# Patient Record
Sex: Male | Born: 1955
Health system: Southern US, Community
[De-identification: ages and names within clinical notes are randomized; demographics above are authoritative.]

## PROBLEM LIST (undated history)

## (undated) DIAGNOSIS — C801 Malignant (primary) neoplasm, unspecified: Secondary | ICD-10-CM

## (undated) DIAGNOSIS — M1990 Unspecified osteoarthritis, unspecified site: Secondary | ICD-10-CM

## (undated) DIAGNOSIS — J449 Chronic obstructive pulmonary disease, unspecified: Secondary | ICD-10-CM

## (undated) DIAGNOSIS — Z923 Personal history of irradiation: Secondary | ICD-10-CM

## (undated) HISTORY — PX: ROTATOR CUFF REPAIR: SHX139

## (undated) HISTORY — DX: Malignant (primary) neoplasm, unspecified: C80.1

---

## 2005-05-25 ENCOUNTER — Ambulatory Visit (HOSPITAL_COMMUNITY): Admission: RE | Admit: 2005-05-25 | Discharge: 2005-05-25 | Payer: Self-pay | Admitting: Internal Medicine

## 2006-12-02 ENCOUNTER — Ambulatory Visit (HOSPITAL_COMMUNITY): Admission: RE | Admit: 2006-12-02 | Discharge: 2006-12-02 | Payer: Self-pay | Admitting: Internal Medicine

## 2006-12-09 ENCOUNTER — Ambulatory Visit (HOSPITAL_COMMUNITY): Admission: RE | Admit: 2006-12-09 | Discharge: 2006-12-09 | Payer: Self-pay | Admitting: Internal Medicine

## 2008-02-28 ENCOUNTER — Ambulatory Visit (HOSPITAL_COMMUNITY): Admission: RE | Admit: 2008-02-28 | Discharge: 2008-02-28 | Payer: Self-pay | Admitting: Internal Medicine

## 2010-11-11 NOTE — Procedures (Signed)
NAMEAREN, PRYDE                ACCOUNT NO.:  [id number]   MEDICAL RECORD NO.:  [id number]          PATIENT TYPE:  OUT   LOCATION:  RAD                           FACILITY:  [facility]   PHYSICIAN:  Kingsley Callander. Ouida Sills, MD       DATE OF BIRTH:  [birth year]   DATE OF PROCEDURE:  DATE OF DISCHARGE:                                  STRESS TEST   The patient exercised 13 minutes (1 minute into stage five of the Bruce  protocol) obtaining a maximal heart rate of 169 (101% of the age  predicted maximal heart rate) and a workload of 14.8 METS and  discontinued exercise due to fatigue.  There were no symptoms of chest  pain.  There were no arrhythmias.  There were no ST-segment changes  diagnostic of ischemia.  The baseline electrocardiogram revealed normal  sinus rhythm/sinus bradycardia at 56 beats per minute.   IMPRESSION:  No evidence of exercise-induced ischemia.      Kingsley Callander. Ouida Sills, MD  Electronically Signed     ROF/MEDQ  D:  12/09/2006  T:  12/09/2006  Job:  [id number]

## 2010-12-23 ENCOUNTER — Other Ambulatory Visit (HOSPITAL_COMMUNITY): Payer: Self-pay | Admitting: Internal Medicine

## 2010-12-23 ENCOUNTER — Ambulatory Visit (HOSPITAL_COMMUNITY)
Admission: RE | Admit: 2010-12-23 | Discharge: 2010-12-23 | Disposition: A | Discharge status: Home / Self Care | Payer: Private Health Insurance - Indemnity | Source: Ambulatory Visit | Attending: Internal Medicine | Admitting: Internal Medicine

## 2010-12-23 DIAGNOSIS — M79602 Pain in left arm: Secondary | ICD-10-CM

## 2010-12-23 DIAGNOSIS — M79609 Pain in unspecified limb: Secondary | ICD-10-CM | POA: Insufficient documentation

## 2012-07-26 ENCOUNTER — Ambulatory Visit (HOSPITAL_COMMUNITY)
Admission: RE | Admit: 2012-07-26 | Discharge: 2012-07-26 | Disposition: A | Discharge status: Home / Self Care | Payer: Private Health Insurance - Indemnity | Source: Ambulatory Visit | Attending: Internal Medicine | Admitting: Internal Medicine

## 2012-07-26 ENCOUNTER — Other Ambulatory Visit (HOSPITAL_COMMUNITY): Payer: Self-pay | Admitting: Internal Medicine

## 2012-07-26 DIAGNOSIS — M79609 Pain in unspecified limb: Secondary | ICD-10-CM | POA: Insufficient documentation

## 2012-07-26 DIAGNOSIS — M79603 Pain in arm, unspecified: Secondary | ICD-10-CM

## 2012-07-26 DIAGNOSIS — M545 Low back pain, unspecified: Secondary | ICD-10-CM | POA: Insufficient documentation

## 2012-07-26 DIAGNOSIS — R079 Chest pain, unspecified: Secondary | ICD-10-CM | POA: Insufficient documentation

## 2013-08-31 ENCOUNTER — Other Ambulatory Visit (HOSPITAL_COMMUNITY): Payer: Self-pay | Admitting: Internal Medicine

## 2013-08-31 ENCOUNTER — Ambulatory Visit (HOSPITAL_COMMUNITY)
Admission: RE | Admit: 2013-08-31 | Discharge: 2013-08-31 | Disposition: A | Discharge status: Home / Self Care | Payer: BC Managed Care – PPO | Source: Ambulatory Visit | Attending: Internal Medicine | Admitting: Internal Medicine

## 2013-08-31 DIAGNOSIS — R079 Chest pain, unspecified: Secondary | ICD-10-CM

## 2013-08-31 DIAGNOSIS — M25512 Pain in left shoulder: Secondary | ICD-10-CM

## 2013-08-31 DIAGNOSIS — M25519 Pain in unspecified shoulder: Secondary | ICD-10-CM | POA: Insufficient documentation

## 2014-10-11 ENCOUNTER — Ambulatory Visit (INDEPENDENT_AMBULATORY_CARE_PROVIDER_SITE_OTHER): Payer: BLUE CROSS/BLUE SHIELD | Admitting: Otolaryngology

## 2014-10-11 DIAGNOSIS — H6983 Other specified disorders of Eustachian tube, bilateral: Secondary | ICD-10-CM

## 2014-11-29 ENCOUNTER — Ambulatory Visit (INDEPENDENT_AMBULATORY_CARE_PROVIDER_SITE_OTHER): Payer: BLUE CROSS/BLUE SHIELD | Admitting: Otolaryngology

## 2014-11-29 DIAGNOSIS — H6983 Other specified disorders of Eustachian tube, bilateral: Secondary | ICD-10-CM

## 2016-02-27 DIAGNOSIS — Z125 Encounter for screening for malignant neoplasm of prostate: Secondary | ICD-10-CM | POA: Diagnosis not present

## 2016-02-27 DIAGNOSIS — T7840XD Allergy, unspecified, subsequent encounter: Secondary | ICD-10-CM | POA: Diagnosis not present

## 2016-02-27 DIAGNOSIS — N400 Enlarged prostate without lower urinary tract symptoms: Secondary | ICD-10-CM | POA: Diagnosis not present

## 2016-02-27 DIAGNOSIS — R002 Palpitations: Secondary | ICD-10-CM | POA: Diagnosis not present

## 2016-03-06 DIAGNOSIS — Z0001 Encounter for general adult medical examination with abnormal findings: Secondary | ICD-10-CM | POA: Diagnosis not present

## 2016-03-06 DIAGNOSIS — Z23 Encounter for immunization: Secondary | ICD-10-CM | POA: Diagnosis not present

## 2016-03-06 DIAGNOSIS — M2550 Pain in unspecified joint: Secondary | ICD-10-CM | POA: Diagnosis not present

## 2016-03-06 DIAGNOSIS — M25511 Pain in right shoulder: Secondary | ICD-10-CM | POA: Diagnosis not present

## 2016-03-06 DIAGNOSIS — Z6824 Body mass index (BMI) 24.0-24.9, adult: Secondary | ICD-10-CM | POA: Diagnosis not present

## 2016-03-06 DIAGNOSIS — R002 Palpitations: Secondary | ICD-10-CM | POA: Diagnosis not present

## 2016-03-20 DIAGNOSIS — Z23 Encounter for immunization: Secondary | ICD-10-CM | POA: Diagnosis not present

## 2016-03-23 DIAGNOSIS — Z122 Encounter for screening for malignant neoplasm of respiratory organs: Secondary | ICD-10-CM | POA: Diagnosis not present

## 2016-03-23 DIAGNOSIS — Z87891 Personal history of nicotine dependence: Secondary | ICD-10-CM | POA: Diagnosis not present

## 2016-03-25 DIAGNOSIS — M25442 Effusion, left hand: Secondary | ICD-10-CM | POA: Diagnosis not present

## 2016-03-25 DIAGNOSIS — M25441 Effusion, right hand: Secondary | ICD-10-CM | POA: Diagnosis not present

## 2016-03-25 DIAGNOSIS — M25512 Pain in left shoulder: Secondary | ICD-10-CM | POA: Diagnosis not present

## 2016-03-25 DIAGNOSIS — M25511 Pain in right shoulder: Secondary | ICD-10-CM | POA: Diagnosis not present

## 2016-03-27 ENCOUNTER — Other Ambulatory Visit: Payer: Self-pay | Admitting: Internal Medicine

## 2016-03-27 DIAGNOSIS — Z139 Encounter for screening, unspecified: Secondary | ICD-10-CM

## 2016-03-27 DIAGNOSIS — N281 Cyst of kidney, acquired: Secondary | ICD-10-CM

## 2016-03-31 DIAGNOSIS — M7581 Other shoulder lesions, right shoulder: Secondary | ICD-10-CM | POA: Diagnosis not present

## 2016-04-08 ENCOUNTER — Other Ambulatory Visit: Payer: Self-pay | Admitting: Internal Medicine

## 2016-04-08 ENCOUNTER — Ambulatory Visit
Admission: RE | Admit: 2016-04-08 | Discharge: 2016-04-08 | Disposition: A | Discharge status: Home / Self Care | Payer: BLUE CROSS/BLUE SHIELD | Source: Ambulatory Visit | Attending: Internal Medicine | Admitting: Internal Medicine

## 2016-04-08 DIAGNOSIS — Z139 Encounter for screening, unspecified: Secondary | ICD-10-CM

## 2016-04-08 DIAGNOSIS — N281 Cyst of kidney, acquired: Secondary | ICD-10-CM | POA: Diagnosis not present

## 2016-04-08 DIAGNOSIS — Z77018 Contact with and (suspected) exposure to other hazardous metals: Secondary | ICD-10-CM

## 2016-04-08 DIAGNOSIS — Z01818 Encounter for other preprocedural examination: Secondary | ICD-10-CM | POA: Diagnosis not present

## 2016-04-08 MED ORDER — GADOBENATE DIMEGLUMINE 529 MG/ML IV SOLN
14.0000 mL | Freq: Once | INTRAVENOUS | Status: AC | PRN
  Administered 2016-04-08: 14 mL via INTRAVENOUS

## 2016-04-27 DIAGNOSIS — M130 Polyarthritis, unspecified: Secondary | ICD-10-CM | POA: Diagnosis not present

## 2016-04-27 DIAGNOSIS — M79641 Pain in right hand: Secondary | ICD-10-CM | POA: Diagnosis not present

## 2016-04-27 DIAGNOSIS — M25511 Pain in right shoulder: Secondary | ICD-10-CM | POA: Diagnosis not present

## 2016-04-27 DIAGNOSIS — M79642 Pain in left hand: Secondary | ICD-10-CM | POA: Diagnosis not present

## 2016-04-29 DIAGNOSIS — H52203 Unspecified astigmatism, bilateral: Secondary | ICD-10-CM | POA: Diagnosis not present

## 2016-04-29 DIAGNOSIS — H524 Presbyopia: Secondary | ICD-10-CM | POA: Diagnosis not present

## 2016-04-29 DIAGNOSIS — H5203 Hypermetropia, bilateral: Secondary | ICD-10-CM | POA: Diagnosis not present

## 2016-04-30 DIAGNOSIS — K648 Other hemorrhoids: Secondary | ICD-10-CM | POA: Diagnosis not present

## 2016-05-12 DIAGNOSIS — K642 Third degree hemorrhoids: Secondary | ICD-10-CM | POA: Diagnosis not present

## 2016-06-04 DIAGNOSIS — J019 Acute sinusitis, unspecified: Secondary | ICD-10-CM | POA: Diagnosis not present

## 2016-06-10 DIAGNOSIS — M25442 Effusion, left hand: Secondary | ICD-10-CM | POA: Diagnosis not present

## 2016-06-10 DIAGNOSIS — R799 Abnormal finding of blood chemistry, unspecified: Secondary | ICD-10-CM | POA: Diagnosis not present

## 2016-06-10 DIAGNOSIS — M0570 Rheumatoid arthritis with rheumatoid factor of unspecified site without organ or systems involvement: Secondary | ICD-10-CM | POA: Diagnosis not present

## 2016-06-10 DIAGNOSIS — M25441 Effusion, right hand: Secondary | ICD-10-CM | POA: Diagnosis not present

## 2016-06-15 DIAGNOSIS — M546 Pain in thoracic spine: Secondary | ICD-10-CM | POA: Diagnosis not present

## 2016-06-15 DIAGNOSIS — M5413 Radiculopathy, cervicothoracic region: Secondary | ICD-10-CM | POA: Diagnosis not present

## 2016-06-15 DIAGNOSIS — M9901 Segmental and somatic dysfunction of cervical region: Secondary | ICD-10-CM | POA: Diagnosis not present

## 2016-06-15 DIAGNOSIS — M9902 Segmental and somatic dysfunction of thoracic region: Secondary | ICD-10-CM | POA: Diagnosis not present

## 2016-06-18 DIAGNOSIS — M5413 Radiculopathy, cervicothoracic region: Secondary | ICD-10-CM | POA: Diagnosis not present

## 2016-06-18 DIAGNOSIS — M9901 Segmental and somatic dysfunction of cervical region: Secondary | ICD-10-CM | POA: Diagnosis not present

## 2016-06-18 DIAGNOSIS — M546 Pain in thoracic spine: Secondary | ICD-10-CM | POA: Diagnosis not present

## 2016-06-18 DIAGNOSIS — M9902 Segmental and somatic dysfunction of thoracic region: Secondary | ICD-10-CM | POA: Diagnosis not present

## 2016-06-24 DIAGNOSIS — M5413 Radiculopathy, cervicothoracic region: Secondary | ICD-10-CM | POA: Diagnosis not present

## 2016-06-24 DIAGNOSIS — M546 Pain in thoracic spine: Secondary | ICD-10-CM | POA: Diagnosis not present

## 2016-06-24 DIAGNOSIS — M9901 Segmental and somatic dysfunction of cervical region: Secondary | ICD-10-CM | POA: Diagnosis not present

## 2016-06-24 DIAGNOSIS — M9902 Segmental and somatic dysfunction of thoracic region: Secondary | ICD-10-CM | POA: Diagnosis not present

## 2016-06-26 DIAGNOSIS — M9901 Segmental and somatic dysfunction of cervical region: Secondary | ICD-10-CM | POA: Diagnosis not present

## 2016-06-26 DIAGNOSIS — M546 Pain in thoracic spine: Secondary | ICD-10-CM | POA: Diagnosis not present

## 2016-06-26 DIAGNOSIS — M5413 Radiculopathy, cervicothoracic region: Secondary | ICD-10-CM | POA: Diagnosis not present

## 2016-06-26 DIAGNOSIS — M9902 Segmental and somatic dysfunction of thoracic region: Secondary | ICD-10-CM | POA: Diagnosis not present

## 2016-07-01 DIAGNOSIS — M5413 Radiculopathy, cervicothoracic region: Secondary | ICD-10-CM | POA: Diagnosis not present

## 2016-07-01 DIAGNOSIS — M542 Cervicalgia: Secondary | ICD-10-CM | POA: Diagnosis not present

## 2016-07-01 DIAGNOSIS — Z79899 Other long term (current) drug therapy: Secondary | ICD-10-CM | POA: Diagnosis not present

## 2016-07-01 DIAGNOSIS — M9902 Segmental and somatic dysfunction of thoracic region: Secondary | ICD-10-CM | POA: Diagnosis not present

## 2016-07-01 DIAGNOSIS — M9901 Segmental and somatic dysfunction of cervical region: Secondary | ICD-10-CM | POA: Diagnosis not present

## 2016-07-01 DIAGNOSIS — J069 Acute upper respiratory infection, unspecified: Secondary | ICD-10-CM | POA: Diagnosis not present

## 2016-07-01 DIAGNOSIS — M0570 Rheumatoid arthritis with rheumatoid factor of unspecified site without organ or systems involvement: Secondary | ICD-10-CM | POA: Diagnosis not present

## 2016-07-01 DIAGNOSIS — M546 Pain in thoracic spine: Secondary | ICD-10-CM | POA: Diagnosis not present

## 2016-07-03 DIAGNOSIS — M546 Pain in thoracic spine: Secondary | ICD-10-CM | POA: Diagnosis not present

## 2016-07-03 DIAGNOSIS — M9901 Segmental and somatic dysfunction of cervical region: Secondary | ICD-10-CM | POA: Diagnosis not present

## 2016-07-03 DIAGNOSIS — M5413 Radiculopathy, cervicothoracic region: Secondary | ICD-10-CM | POA: Diagnosis not present

## 2016-07-03 DIAGNOSIS — M9902 Segmental and somatic dysfunction of thoracic region: Secondary | ICD-10-CM | POA: Diagnosis not present

## 2016-07-08 DIAGNOSIS — M5413 Radiculopathy, cervicothoracic region: Secondary | ICD-10-CM | POA: Diagnosis not present

## 2016-07-08 DIAGNOSIS — M546 Pain in thoracic spine: Secondary | ICD-10-CM | POA: Diagnosis not present

## 2016-07-08 DIAGNOSIS — M9901 Segmental and somatic dysfunction of cervical region: Secondary | ICD-10-CM | POA: Diagnosis not present

## 2016-07-08 DIAGNOSIS — M9902 Segmental and somatic dysfunction of thoracic region: Secondary | ICD-10-CM | POA: Diagnosis not present

## 2016-07-10 DIAGNOSIS — M9901 Segmental and somatic dysfunction of cervical region: Secondary | ICD-10-CM | POA: Diagnosis not present

## 2016-07-10 DIAGNOSIS — M546 Pain in thoracic spine: Secondary | ICD-10-CM | POA: Diagnosis not present

## 2016-07-10 DIAGNOSIS — M5413 Radiculopathy, cervicothoracic region: Secondary | ICD-10-CM | POA: Diagnosis not present

## 2016-07-10 DIAGNOSIS — M9902 Segmental and somatic dysfunction of thoracic region: Secondary | ICD-10-CM | POA: Diagnosis not present

## 2016-07-14 DIAGNOSIS — R911 Solitary pulmonary nodule: Secondary | ICD-10-CM | POA: Diagnosis not present

## 2016-07-20 DIAGNOSIS — M546 Pain in thoracic spine: Secondary | ICD-10-CM | POA: Diagnosis not present

## 2016-07-20 DIAGNOSIS — M9902 Segmental and somatic dysfunction of thoracic region: Secondary | ICD-10-CM | POA: Diagnosis not present

## 2016-07-20 DIAGNOSIS — M9901 Segmental and somatic dysfunction of cervical region: Secondary | ICD-10-CM | POA: Diagnosis not present

## 2016-07-20 DIAGNOSIS — M5413 Radiculopathy, cervicothoracic region: Secondary | ICD-10-CM | POA: Diagnosis not present

## 2016-07-29 DIAGNOSIS — M0570 Rheumatoid arthritis with rheumatoid factor of unspecified site without organ or systems involvement: Secondary | ICD-10-CM | POA: Diagnosis not present

## 2016-07-29 DIAGNOSIS — J329 Chronic sinusitis, unspecified: Secondary | ICD-10-CM | POA: Diagnosis not present

## 2016-07-29 DIAGNOSIS — M79642 Pain in left hand: Secondary | ICD-10-CM | POA: Diagnosis not present

## 2016-07-29 DIAGNOSIS — M79641 Pain in right hand: Secondary | ICD-10-CM | POA: Diagnosis not present

## 2016-07-31 DIAGNOSIS — M9902 Segmental and somatic dysfunction of thoracic region: Secondary | ICD-10-CM | POA: Diagnosis not present

## 2016-07-31 DIAGNOSIS — M546 Pain in thoracic spine: Secondary | ICD-10-CM | POA: Diagnosis not present

## 2016-07-31 DIAGNOSIS — M9901 Segmental and somatic dysfunction of cervical region: Secondary | ICD-10-CM | POA: Diagnosis not present

## 2016-07-31 DIAGNOSIS — M5413 Radiculopathy, cervicothoracic region: Secondary | ICD-10-CM | POA: Diagnosis not present

## 2016-08-11 DIAGNOSIS — J320 Chronic maxillary sinusitis: Secondary | ICD-10-CM | POA: Diagnosis not present

## 2016-08-14 DIAGNOSIS — M5413 Radiculopathy, cervicothoracic region: Secondary | ICD-10-CM | POA: Diagnosis not present

## 2016-08-14 DIAGNOSIS — M9902 Segmental and somatic dysfunction of thoracic region: Secondary | ICD-10-CM | POA: Diagnosis not present

## 2016-08-14 DIAGNOSIS — M9901 Segmental and somatic dysfunction of cervical region: Secondary | ICD-10-CM | POA: Diagnosis not present

## 2016-08-14 DIAGNOSIS — M546 Pain in thoracic spine: Secondary | ICD-10-CM | POA: Diagnosis not present

## 2016-09-22 DIAGNOSIS — J320 Chronic maxillary sinusitis: Secondary | ICD-10-CM | POA: Diagnosis not present

## 2016-09-22 DIAGNOSIS — K046 Periapical abscess with sinus: Secondary | ICD-10-CM | POA: Diagnosis not present

## 2016-10-08 DIAGNOSIS — M9902 Segmental and somatic dysfunction of thoracic region: Secondary | ICD-10-CM | POA: Diagnosis not present

## 2016-10-08 DIAGNOSIS — M546 Pain in thoracic spine: Secondary | ICD-10-CM | POA: Diagnosis not present

## 2016-10-08 DIAGNOSIS — M9901 Segmental and somatic dysfunction of cervical region: Secondary | ICD-10-CM | POA: Diagnosis not present

## 2016-10-08 DIAGNOSIS — M5413 Radiculopathy, cervicothoracic region: Secondary | ICD-10-CM | POA: Diagnosis not present

## 2016-10-13 DIAGNOSIS — Z6825 Body mass index (BMI) 25.0-25.9, adult: Secondary | ICD-10-CM | POA: Diagnosis not present

## 2016-10-13 DIAGNOSIS — J019 Acute sinusitis, unspecified: Secondary | ICD-10-CM | POA: Diagnosis not present

## 2016-10-13 DIAGNOSIS — L219 Seborrheic dermatitis, unspecified: Secondary | ICD-10-CM | POA: Diagnosis not present

## 2016-10-20 DIAGNOSIS — M150 Primary generalized (osteo)arthritis: Secondary | ICD-10-CM | POA: Diagnosis not present

## 2016-10-20 DIAGNOSIS — M0579 Rheumatoid arthritis with rheumatoid factor of multiple sites without organ or systems involvement: Secondary | ICD-10-CM | POA: Diagnosis not present

## 2016-10-29 DIAGNOSIS — L570 Actinic keratosis: Secondary | ICD-10-CM | POA: Diagnosis not present

## 2016-10-29 DIAGNOSIS — D485 Neoplasm of uncertain behavior of skin: Secondary | ICD-10-CM | POA: Diagnosis not present

## 2016-12-14 DIAGNOSIS — J320 Chronic maxillary sinusitis: Secondary | ICD-10-CM | POA: Diagnosis not present

## 2016-12-14 DIAGNOSIS — K046 Periapical abscess with sinus: Secondary | ICD-10-CM | POA: Diagnosis not present

## 2016-12-21 DIAGNOSIS — M150 Primary generalized (osteo)arthritis: Secondary | ICD-10-CM | POA: Diagnosis not present

## 2016-12-21 DIAGNOSIS — M0579 Rheumatoid arthritis with rheumatoid factor of multiple sites without organ or systems involvement: Secondary | ICD-10-CM | POA: Diagnosis not present

## 2016-12-28 DIAGNOSIS — J320 Chronic maxillary sinusitis: Secondary | ICD-10-CM | POA: Diagnosis not present

## 2016-12-28 DIAGNOSIS — J342 Deviated nasal septum: Secondary | ICD-10-CM | POA: Diagnosis not present

## 2017-01-28 DIAGNOSIS — M150 Primary generalized (osteo)arthritis: Secondary | ICD-10-CM | POA: Diagnosis not present

## 2017-01-28 DIAGNOSIS — M0579 Rheumatoid arthritis with rheumatoid factor of multiple sites without organ or systems involvement: Secondary | ICD-10-CM | POA: Diagnosis not present

## 2017-02-01 DIAGNOSIS — J320 Chronic maxillary sinusitis: Secondary | ICD-10-CM | POA: Diagnosis not present

## 2017-02-01 DIAGNOSIS — J329 Chronic sinusitis, unspecified: Secondary | ICD-10-CM | POA: Diagnosis not present

## 2017-02-01 DIAGNOSIS — J0120 Acute ethmoidal sinusitis, unspecified: Secondary | ICD-10-CM | POA: Diagnosis not present

## 2017-03-31 DIAGNOSIS — L309 Dermatitis, unspecified: Secondary | ICD-10-CM | POA: Diagnosis not present

## 2017-04-06 ENCOUNTER — Other Ambulatory Visit (HOSPITAL_COMMUNITY): Payer: Self-pay | Admitting: Internal Medicine

## 2017-04-06 ENCOUNTER — Ambulatory Visit (HOSPITAL_COMMUNITY)
Admission: RE | Admit: 2017-04-06 | Discharge: 2017-04-06 | Disposition: A | Discharge status: Home / Self Care | Payer: BLUE CROSS/BLUE SHIELD | Source: Ambulatory Visit | Attending: Internal Medicine | Admitting: Internal Medicine

## 2017-04-06 DIAGNOSIS — M25562 Pain in left knee: Secondary | ICD-10-CM

## 2017-04-06 DIAGNOSIS — M722 Plantar fascial fibromatosis: Secondary | ICD-10-CM | POA: Diagnosis not present

## 2017-04-06 DIAGNOSIS — M7732 Calcaneal spur, left foot: Secondary | ICD-10-CM | POA: Insufficient documentation

## 2017-04-06 DIAGNOSIS — M79672 Pain in left foot: Secondary | ICD-10-CM | POA: Diagnosis not present

## 2017-04-06 DIAGNOSIS — M25511 Pain in right shoulder: Secondary | ICD-10-CM | POA: Diagnosis not present

## 2017-04-08 DIAGNOSIS — M150 Primary generalized (osteo)arthritis: Secondary | ICD-10-CM | POA: Diagnosis not present

## 2017-04-08 DIAGNOSIS — M0579 Rheumatoid arthritis with rheumatoid factor of multiple sites without organ or systems involvement: Secondary | ICD-10-CM | POA: Diagnosis not present

## 2017-04-20 DIAGNOSIS — R911 Solitary pulmonary nodule: Secondary | ICD-10-CM | POA: Diagnosis not present

## 2017-04-23 DIAGNOSIS — R51 Headache: Secondary | ICD-10-CM | POA: Diagnosis not present

## 2017-04-23 DIAGNOSIS — Z79899 Other long term (current) drug therapy: Secondary | ICD-10-CM | POA: Diagnosis not present

## 2017-04-23 DIAGNOSIS — Z125 Encounter for screening for malignant neoplasm of prostate: Secondary | ICD-10-CM | POA: Diagnosis not present

## 2017-04-23 DIAGNOSIS — I7300 Raynaud's syndrome without gangrene: Secondary | ICD-10-CM | POA: Diagnosis not present

## 2017-04-30 DIAGNOSIS — Z23 Encounter for immunization: Secondary | ICD-10-CM | POA: Diagnosis not present

## 2017-04-30 DIAGNOSIS — Z6825 Body mass index (BMI) 25.0-25.9, adult: Secondary | ICD-10-CM | POA: Diagnosis not present

## 2017-04-30 DIAGNOSIS — Z0001 Encounter for general adult medical examination with abnormal findings: Secondary | ICD-10-CM | POA: Diagnosis not present

## 2017-04-30 DIAGNOSIS — I700 Atherosclerosis of aorta: Secondary | ICD-10-CM | POA: Diagnosis not present

## 2017-04-30 DIAGNOSIS — M069 Rheumatoid arthritis, unspecified: Secondary | ICD-10-CM | POA: Diagnosis not present

## 2017-07-29 DIAGNOSIS — M150 Primary generalized (osteo)arthritis: Secondary | ICD-10-CM | POA: Diagnosis not present

## 2017-07-29 DIAGNOSIS — M0579 Rheumatoid arthritis with rheumatoid factor of multiple sites without organ or systems involvement: Secondary | ICD-10-CM | POA: Diagnosis not present

## 2017-10-29 DIAGNOSIS — M150 Primary generalized (osteo)arthritis: Secondary | ICD-10-CM | POA: Diagnosis not present

## 2017-10-29 DIAGNOSIS — K219 Gastro-esophageal reflux disease without esophagitis: Secondary | ICD-10-CM | POA: Diagnosis not present

## 2017-10-29 DIAGNOSIS — M0579 Rheumatoid arthritis with rheumatoid factor of multiple sites without organ or systems involvement: Secondary | ICD-10-CM | POA: Diagnosis not present

## 2018-02-08 DIAGNOSIS — M9901 Segmental and somatic dysfunction of cervical region: Secondary | ICD-10-CM | POA: Diagnosis not present

## 2018-02-08 DIAGNOSIS — M9902 Segmental and somatic dysfunction of thoracic region: Secondary | ICD-10-CM | POA: Diagnosis not present

## 2018-02-08 DIAGNOSIS — M546 Pain in thoracic spine: Secondary | ICD-10-CM | POA: Diagnosis not present

## 2018-02-08 DIAGNOSIS — Z6825 Body mass index (BMI) 25.0-25.9, adult: Secondary | ICD-10-CM | POA: Diagnosis not present

## 2018-02-08 DIAGNOSIS — M79672 Pain in left foot: Secondary | ICD-10-CM | POA: Diagnosis not present

## 2018-02-08 DIAGNOSIS — M5413 Radiculopathy, cervicothoracic region: Secondary | ICD-10-CM | POA: Diagnosis not present

## 2018-02-09 ENCOUNTER — Ambulatory Visit (HOSPITAL_COMMUNITY)
Admission: RE | Admit: 2018-02-09 | Discharge: 2018-02-09 | Disposition: A | Discharge status: Home / Self Care | Payer: BLUE CROSS/BLUE SHIELD | Source: Ambulatory Visit | Attending: Internal Medicine | Admitting: Internal Medicine

## 2018-02-09 ENCOUNTER — Other Ambulatory Visit (HOSPITAL_COMMUNITY): Payer: Self-pay | Admitting: Internal Medicine

## 2018-02-09 DIAGNOSIS — M79672 Pain in left foot: Secondary | ICD-10-CM | POA: Diagnosis not present

## 2018-02-10 DIAGNOSIS — M9902 Segmental and somatic dysfunction of thoracic region: Secondary | ICD-10-CM | POA: Diagnosis not present

## 2018-02-10 DIAGNOSIS — M545 Low back pain: Secondary | ICD-10-CM | POA: Diagnosis not present

## 2018-02-10 DIAGNOSIS — M546 Pain in thoracic spine: Secondary | ICD-10-CM | POA: Diagnosis not present

## 2018-02-10 DIAGNOSIS — M9903 Segmental and somatic dysfunction of lumbar region: Secondary | ICD-10-CM | POA: Diagnosis not present

## 2018-02-11 DIAGNOSIS — M9903 Segmental and somatic dysfunction of lumbar region: Secondary | ICD-10-CM | POA: Diagnosis not present

## 2018-02-11 DIAGNOSIS — M546 Pain in thoracic spine: Secondary | ICD-10-CM | POA: Diagnosis not present

## 2018-02-11 DIAGNOSIS — M9902 Segmental and somatic dysfunction of thoracic region: Secondary | ICD-10-CM | POA: Diagnosis not present

## 2018-02-11 DIAGNOSIS — M545 Low back pain: Secondary | ICD-10-CM | POA: Diagnosis not present

## 2018-02-14 DIAGNOSIS — M9903 Segmental and somatic dysfunction of lumbar region: Secondary | ICD-10-CM | POA: Diagnosis not present

## 2018-02-14 DIAGNOSIS — M9902 Segmental and somatic dysfunction of thoracic region: Secondary | ICD-10-CM | POA: Diagnosis not present

## 2018-02-14 DIAGNOSIS — M546 Pain in thoracic spine: Secondary | ICD-10-CM | POA: Diagnosis not present

## 2018-02-14 DIAGNOSIS — M545 Low back pain: Secondary | ICD-10-CM | POA: Diagnosis not present

## 2018-02-15 DIAGNOSIS — M7742 Metatarsalgia, left foot: Secondary | ICD-10-CM | POA: Diagnosis not present

## 2018-02-17 DIAGNOSIS — M545 Low back pain: Secondary | ICD-10-CM | POA: Diagnosis not present

## 2018-02-17 DIAGNOSIS — M546 Pain in thoracic spine: Secondary | ICD-10-CM | POA: Diagnosis not present

## 2018-02-17 DIAGNOSIS — M9903 Segmental and somatic dysfunction of lumbar region: Secondary | ICD-10-CM | POA: Diagnosis not present

## 2018-02-17 DIAGNOSIS — M9902 Segmental and somatic dysfunction of thoracic region: Secondary | ICD-10-CM | POA: Diagnosis not present

## 2018-02-24 DIAGNOSIS — M546 Pain in thoracic spine: Secondary | ICD-10-CM | POA: Diagnosis not present

## 2018-02-24 DIAGNOSIS — M9903 Segmental and somatic dysfunction of lumbar region: Secondary | ICD-10-CM | POA: Diagnosis not present

## 2018-02-24 DIAGNOSIS — M9902 Segmental and somatic dysfunction of thoracic region: Secondary | ICD-10-CM | POA: Diagnosis not present

## 2018-02-24 DIAGNOSIS — M545 Low back pain: Secondary | ICD-10-CM | POA: Diagnosis not present

## 2018-03-08 DIAGNOSIS — M7742 Metatarsalgia, left foot: Secondary | ICD-10-CM | POA: Diagnosis not present

## 2018-03-08 DIAGNOSIS — M7581 Other shoulder lesions, right shoulder: Secondary | ICD-10-CM | POA: Diagnosis not present

## 2018-03-16 DIAGNOSIS — M79672 Pain in left foot: Secondary | ICD-10-CM | POA: Diagnosis not present

## 2018-03-21 DIAGNOSIS — M150 Primary generalized (osteo)arthritis: Secondary | ICD-10-CM | POA: Diagnosis not present

## 2018-03-21 DIAGNOSIS — K219 Gastro-esophageal reflux disease without esophagitis: Secondary | ICD-10-CM | POA: Diagnosis not present

## 2018-03-21 DIAGNOSIS — M0579 Rheumatoid arthritis with rheumatoid factor of multiple sites without organ or systems involvement: Secondary | ICD-10-CM | POA: Diagnosis not present

## 2018-04-27 DIAGNOSIS — T1590XA Foreign body on external eye, part unspecified, unspecified eye, initial encounter: Secondary | ICD-10-CM | POA: Diagnosis not present

## 2018-04-27 DIAGNOSIS — M79672 Pain in left foot: Secondary | ICD-10-CM | POA: Diagnosis not present

## 2018-04-28 ENCOUNTER — Other Ambulatory Visit: Payer: Self-pay | Admitting: Orthopaedic Surgery

## 2018-05-03 ENCOUNTER — Other Ambulatory Visit: Payer: Self-pay | Admitting: Orthopaedic Surgery

## 2018-05-03 DIAGNOSIS — M79672 Pain in left foot: Secondary | ICD-10-CM

## 2018-05-03 DIAGNOSIS — M84375A Stress fracture, left foot, initial encounter for fracture: Secondary | ICD-10-CM

## 2018-05-06 DIAGNOSIS — M9903 Segmental and somatic dysfunction of lumbar region: Secondary | ICD-10-CM | POA: Diagnosis not present

## 2018-05-06 DIAGNOSIS — M546 Pain in thoracic spine: Secondary | ICD-10-CM | POA: Diagnosis not present

## 2018-05-06 DIAGNOSIS — M9902 Segmental and somatic dysfunction of thoracic region: Secondary | ICD-10-CM | POA: Diagnosis not present

## 2018-05-06 DIAGNOSIS — M545 Low back pain: Secondary | ICD-10-CM | POA: Diagnosis not present

## 2018-05-13 ENCOUNTER — Ambulatory Visit
Admission: RE | Admit: 2018-05-13 | Discharge: 2018-05-13 | Disposition: A | Discharge status: Home / Self Care | Payer: BLUE CROSS/BLUE SHIELD | Source: Ambulatory Visit | Attending: Orthopaedic Surgery | Admitting: Orthopaedic Surgery

## 2018-05-13 DIAGNOSIS — M84375A Stress fracture, left foot, initial encounter for fracture: Secondary | ICD-10-CM

## 2018-05-13 DIAGNOSIS — M79672 Pain in left foot: Secondary | ICD-10-CM

## 2018-05-13 DIAGNOSIS — M25475 Effusion, left foot: Secondary | ICD-10-CM | POA: Diagnosis not present

## 2018-05-16 DIAGNOSIS — S46011D Strain of muscle(s) and tendon(s) of the rotator cuff of right shoulder, subsequent encounter: Secondary | ICD-10-CM | POA: Diagnosis not present

## 2018-05-19 DIAGNOSIS — M79672 Pain in left foot: Secondary | ICD-10-CM | POA: Diagnosis not present

## 2018-05-23 ENCOUNTER — Other Ambulatory Visit: Payer: Self-pay | Admitting: Sports Medicine

## 2018-05-23 DIAGNOSIS — M25511 Pain in right shoulder: Principal | ICD-10-CM

## 2018-05-23 DIAGNOSIS — G8929 Other chronic pain: Secondary | ICD-10-CM

## 2018-06-07 ENCOUNTER — Ambulatory Visit
Admission: RE | Admit: 2018-06-07 | Discharge: 2018-06-07 | Disposition: A | Discharge status: Home / Self Care | Payer: BLUE CROSS/BLUE SHIELD | Source: Ambulatory Visit | Attending: Sports Medicine | Admitting: Sports Medicine

## 2018-06-07 DIAGNOSIS — M25511 Pain in right shoulder: Principal | ICD-10-CM

## 2018-06-07 DIAGNOSIS — G8929 Other chronic pain: Secondary | ICD-10-CM

## 2018-06-07 DIAGNOSIS — S46011A Strain of muscle(s) and tendon(s) of the rotator cuff of right shoulder, initial encounter: Secondary | ICD-10-CM | POA: Diagnosis not present

## 2018-06-07 DIAGNOSIS — Q667 Congenital pes cavus, unspecified foot: Secondary | ICD-10-CM | POA: Diagnosis not present

## 2018-06-07 MED ORDER — IOPAMIDOL (ISOVUE-M 200) INJECTION 41%
1.0000 mL | Freq: Once | INTRAMUSCULAR | Status: AC
  Administered 2018-06-07: 12 mL via INTRA_ARTICULAR

## 2018-06-09 DIAGNOSIS — J439 Emphysema, unspecified: Secondary | ICD-10-CM | POA: Diagnosis not present

## 2018-06-09 DIAGNOSIS — Z125 Encounter for screening for malignant neoplasm of prostate: Secondary | ICD-10-CM | POA: Diagnosis not present

## 2018-06-09 DIAGNOSIS — I700 Atherosclerosis of aorta: Secondary | ICD-10-CM | POA: Diagnosis not present

## 2018-06-09 DIAGNOSIS — M069 Rheumatoid arthritis, unspecified: Secondary | ICD-10-CM | POA: Diagnosis not present

## 2018-06-09 DIAGNOSIS — Z79899 Other long term (current) drug therapy: Secondary | ICD-10-CM | POA: Diagnosis not present

## 2018-06-14 DIAGNOSIS — S46011D Strain of muscle(s) and tendon(s) of the rotator cuff of right shoulder, subsequent encounter: Secondary | ICD-10-CM | POA: Diagnosis not present

## 2018-06-15 DIAGNOSIS — M150 Primary generalized (osteo)arthritis: Secondary | ICD-10-CM | POA: Diagnosis not present

## 2018-06-15 DIAGNOSIS — M25511 Pain in right shoulder: Secondary | ICD-10-CM | POA: Diagnosis not present

## 2018-06-15 DIAGNOSIS — K219 Gastro-esophageal reflux disease without esophagitis: Secondary | ICD-10-CM | POA: Diagnosis not present

## 2018-06-15 DIAGNOSIS — M0579 Rheumatoid arthritis with rheumatoid factor of multiple sites without organ or systems involvement: Secondary | ICD-10-CM | POA: Diagnosis not present

## 2018-06-16 DIAGNOSIS — M0600 Rheumatoid arthritis without rheumatoid factor, unspecified site: Secondary | ICD-10-CM | POA: Diagnosis not present

## 2018-06-16 DIAGNOSIS — Z6825 Body mass index (BMI) 25.0-25.9, adult: Secondary | ICD-10-CM | POA: Diagnosis not present

## 2018-06-16 DIAGNOSIS — Z0001 Encounter for general adult medical examination with abnormal findings: Secondary | ICD-10-CM | POA: Diagnosis not present

## 2018-06-16 DIAGNOSIS — J439 Emphysema, unspecified: Secondary | ICD-10-CM | POA: Diagnosis not present

## 2018-07-06 DIAGNOSIS — S46011A Strain of muscle(s) and tendon(s) of the rotator cuff of right shoulder, initial encounter: Secondary | ICD-10-CM | POA: Diagnosis not present

## 2018-07-06 DIAGNOSIS — G8918 Other acute postprocedural pain: Secondary | ICD-10-CM | POA: Diagnosis not present

## 2018-07-06 DIAGNOSIS — S46291A Other injury of muscle, fascia and tendon of other parts of biceps, right arm, initial encounter: Secondary | ICD-10-CM | POA: Diagnosis not present

## 2018-07-06 DIAGNOSIS — S43491A Other sprain of right shoulder joint, initial encounter: Secondary | ICD-10-CM | POA: Diagnosis not present

## 2018-07-06 DIAGNOSIS — M24111 Other articular cartilage disorders, right shoulder: Secondary | ICD-10-CM | POA: Diagnosis not present

## 2018-07-06 DIAGNOSIS — M7541 Impingement syndrome of right shoulder: Secondary | ICD-10-CM | POA: Diagnosis not present

## 2018-07-06 DIAGNOSIS — M7521 Bicipital tendinitis, right shoulder: Secondary | ICD-10-CM | POA: Diagnosis not present

## 2018-07-06 DIAGNOSIS — M7551 Bursitis of right shoulder: Secondary | ICD-10-CM | POA: Diagnosis not present

## 2018-07-11 DIAGNOSIS — M25511 Pain in right shoulder: Secondary | ICD-10-CM | POA: Diagnosis not present

## 2018-07-12 DIAGNOSIS — S46011D Strain of muscle(s) and tendon(s) of the rotator cuff of right shoulder, subsequent encounter: Secondary | ICD-10-CM | POA: Diagnosis not present

## 2018-07-14 DIAGNOSIS — M25511 Pain in right shoulder: Secondary | ICD-10-CM | POA: Diagnosis not present

## 2018-07-18 DIAGNOSIS — M25511 Pain in right shoulder: Secondary | ICD-10-CM | POA: Diagnosis not present

## 2018-07-21 DIAGNOSIS — M25511 Pain in right shoulder: Secondary | ICD-10-CM | POA: Diagnosis not present

## 2018-07-25 DIAGNOSIS — M25511 Pain in right shoulder: Secondary | ICD-10-CM | POA: Diagnosis not present

## 2018-07-28 DIAGNOSIS — M25511 Pain in right shoulder: Secondary | ICD-10-CM | POA: Diagnosis not present

## 2018-08-01 DIAGNOSIS — M25511 Pain in right shoulder: Secondary | ICD-10-CM | POA: Diagnosis not present

## 2018-08-02 DIAGNOSIS — S46011D Strain of muscle(s) and tendon(s) of the rotator cuff of right shoulder, subsequent encounter: Secondary | ICD-10-CM | POA: Diagnosis not present

## 2018-08-04 DIAGNOSIS — M25511 Pain in right shoulder: Secondary | ICD-10-CM | POA: Diagnosis not present

## 2018-08-08 DIAGNOSIS — M25511 Pain in right shoulder: Secondary | ICD-10-CM | POA: Diagnosis not present

## 2018-08-11 DIAGNOSIS — M25511 Pain in right shoulder: Secondary | ICD-10-CM | POA: Diagnosis not present

## 2018-08-15 DIAGNOSIS — M25511 Pain in right shoulder: Secondary | ICD-10-CM | POA: Diagnosis not present

## 2018-08-18 DIAGNOSIS — M25511 Pain in right shoulder: Secondary | ICD-10-CM | POA: Diagnosis not present

## 2018-08-22 DIAGNOSIS — M25511 Pain in right shoulder: Secondary | ICD-10-CM | POA: Diagnosis not present

## 2018-08-25 DIAGNOSIS — M25511 Pain in right shoulder: Secondary | ICD-10-CM | POA: Diagnosis not present

## 2018-08-29 DIAGNOSIS — M25511 Pain in right shoulder: Secondary | ICD-10-CM | POA: Diagnosis not present

## 2018-08-30 DIAGNOSIS — S46011D Strain of muscle(s) and tendon(s) of the rotator cuff of right shoulder, subsequent encounter: Secondary | ICD-10-CM | POA: Diagnosis not present

## 2018-09-01 DIAGNOSIS — M25511 Pain in right shoulder: Secondary | ICD-10-CM | POA: Diagnosis not present

## 2018-09-05 DIAGNOSIS — M25511 Pain in right shoulder: Secondary | ICD-10-CM | POA: Diagnosis not present

## 2018-09-08 DIAGNOSIS — M25511 Pain in right shoulder: Secondary | ICD-10-CM | POA: Diagnosis not present

## 2018-09-12 DIAGNOSIS — M25511 Pain in right shoulder: Secondary | ICD-10-CM | POA: Diagnosis not present

## 2018-09-14 DIAGNOSIS — M0579 Rheumatoid arthritis with rheumatoid factor of multiple sites without organ or systems involvement: Secondary | ICD-10-CM | POA: Diagnosis not present

## 2018-09-14 DIAGNOSIS — M25511 Pain in right shoulder: Secondary | ICD-10-CM | POA: Diagnosis not present

## 2018-09-14 DIAGNOSIS — M150 Primary generalized (osteo)arthritis: Secondary | ICD-10-CM | POA: Diagnosis not present

## 2018-09-14 DIAGNOSIS — K219 Gastro-esophageal reflux disease without esophagitis: Secondary | ICD-10-CM | POA: Diagnosis not present

## 2018-09-15 DIAGNOSIS — M25511 Pain in right shoulder: Secondary | ICD-10-CM | POA: Diagnosis not present

## 2018-09-19 DIAGNOSIS — M25511 Pain in right shoulder: Secondary | ICD-10-CM | POA: Diagnosis not present

## 2018-09-22 DIAGNOSIS — M25511 Pain in right shoulder: Secondary | ICD-10-CM | POA: Diagnosis not present

## 2018-09-26 DIAGNOSIS — M25511 Pain in right shoulder: Secondary | ICD-10-CM | POA: Diagnosis not present

## 2018-09-27 DIAGNOSIS — S46011D Strain of muscle(s) and tendon(s) of the rotator cuff of right shoulder, subsequent encounter: Secondary | ICD-10-CM | POA: Diagnosis not present

## 2018-10-03 DIAGNOSIS — M25511 Pain in right shoulder: Secondary | ICD-10-CM | POA: Diagnosis not present

## 2018-10-10 DIAGNOSIS — M25511 Pain in right shoulder: Secondary | ICD-10-CM | POA: Diagnosis not present

## 2018-10-17 DIAGNOSIS — M25511 Pain in right shoulder: Secondary | ICD-10-CM | POA: Diagnosis not present

## 2018-10-25 DIAGNOSIS — M25551 Pain in right hip: Secondary | ICD-10-CM | POA: Diagnosis not present

## 2018-10-25 DIAGNOSIS — S46011D Strain of muscle(s) and tendon(s) of the rotator cuff of right shoulder, subsequent encounter: Secondary | ICD-10-CM | POA: Diagnosis not present

## 2018-11-23 DIAGNOSIS — M9903 Segmental and somatic dysfunction of lumbar region: Secondary | ICD-10-CM | POA: Diagnosis not present

## 2018-11-23 DIAGNOSIS — M546 Pain in thoracic spine: Secondary | ICD-10-CM | POA: Diagnosis not present

## 2018-11-23 DIAGNOSIS — M545 Low back pain: Secondary | ICD-10-CM | POA: Diagnosis not present

## 2018-11-23 DIAGNOSIS — M9902 Segmental and somatic dysfunction of thoracic region: Secondary | ICD-10-CM | POA: Diagnosis not present

## 2018-12-16 ENCOUNTER — Other Ambulatory Visit (HOSPITAL_COMMUNITY): Payer: Self-pay | Admitting: Internal Medicine

## 2018-12-16 ENCOUNTER — Ambulatory Visit (HOSPITAL_COMMUNITY)
Admission: RE | Admit: 2018-12-16 | Discharge: 2018-12-16 | Disposition: A | Discharge status: Home / Self Care | Payer: BC Managed Care – PPO | Source: Ambulatory Visit | Attending: Internal Medicine | Admitting: Internal Medicine

## 2018-12-16 ENCOUNTER — Other Ambulatory Visit: Payer: Self-pay

## 2018-12-16 DIAGNOSIS — R079 Chest pain, unspecified: Secondary | ICD-10-CM | POA: Diagnosis not present

## 2018-12-19 DIAGNOSIS — K219 Gastro-esophageal reflux disease without esophagitis: Secondary | ICD-10-CM | POA: Diagnosis not present

## 2018-12-19 DIAGNOSIS — M0579 Rheumatoid arthritis with rheumatoid factor of multiple sites without organ or systems involvement: Secondary | ICD-10-CM | POA: Diagnosis not present

## 2018-12-19 DIAGNOSIS — M150 Primary generalized (osteo)arthritis: Secondary | ICD-10-CM | POA: Diagnosis not present

## 2018-12-19 DIAGNOSIS — M25511 Pain in right shoulder: Secondary | ICD-10-CM | POA: Diagnosis not present

## 2019-03-21 DIAGNOSIS — M0579 Rheumatoid arthritis with rheumatoid factor of multiple sites without organ or systems involvement: Secondary | ICD-10-CM | POA: Diagnosis not present

## 2019-03-21 DIAGNOSIS — K219 Gastro-esophageal reflux disease without esophagitis: Secondary | ICD-10-CM | POA: Diagnosis not present

## 2019-03-21 DIAGNOSIS — M150 Primary generalized (osteo)arthritis: Secondary | ICD-10-CM | POA: Diagnosis not present

## 2019-06-27 DIAGNOSIS — M25511 Pain in right shoulder: Secondary | ICD-10-CM | POA: Diagnosis not present

## 2019-06-27 DIAGNOSIS — M0579 Rheumatoid arthritis with rheumatoid factor of multiple sites without organ or systems involvement: Secondary | ICD-10-CM | POA: Diagnosis not present

## 2019-06-27 DIAGNOSIS — M150 Primary generalized (osteo)arthritis: Secondary | ICD-10-CM | POA: Diagnosis not present

## 2019-06-27 DIAGNOSIS — K219 Gastro-esophageal reflux disease without esophagitis: Secondary | ICD-10-CM | POA: Diagnosis not present

## 2019-07-13 DIAGNOSIS — M069 Rheumatoid arthritis, unspecified: Secondary | ICD-10-CM | POA: Diagnosis not present

## 2019-07-13 DIAGNOSIS — Z125 Encounter for screening for malignant neoplasm of prostate: Secondary | ICD-10-CM | POA: Diagnosis not present

## 2019-07-13 DIAGNOSIS — J439 Emphysema, unspecified: Secondary | ICD-10-CM | POA: Diagnosis not present

## 2019-07-13 DIAGNOSIS — I700 Atherosclerosis of aorta: Secondary | ICD-10-CM | POA: Diagnosis not present

## 2019-07-13 DIAGNOSIS — Z79899 Other long term (current) drug therapy: Secondary | ICD-10-CM | POA: Diagnosis not present

## 2019-07-20 DIAGNOSIS — M0600 Rheumatoid arthritis without rheumatoid factor, unspecified site: Secondary | ICD-10-CM | POA: Diagnosis not present

## 2019-07-20 DIAGNOSIS — I700 Atherosclerosis of aorta: Secondary | ICD-10-CM | POA: Diagnosis not present

## 2019-07-20 DIAGNOSIS — J439 Emphysema, unspecified: Secondary | ICD-10-CM | POA: Diagnosis not present

## 2019-07-21 DIAGNOSIS — R5383 Other fatigue: Secondary | ICD-10-CM | POA: Diagnosis not present

## 2019-07-21 DIAGNOSIS — N529 Male erectile dysfunction, unspecified: Secondary | ICD-10-CM | POA: Diagnosis not present

## 2019-08-30 ENCOUNTER — Other Ambulatory Visit: Payer: Self-pay | Admitting: Internal Medicine

## 2019-08-30 DIAGNOSIS — J439 Emphysema, unspecified: Secondary | ICD-10-CM

## 2019-10-09 DIAGNOSIS — Z6825 Body mass index (BMI) 25.0-25.9, adult: Secondary | ICD-10-CM | POA: Diagnosis not present

## 2019-10-09 DIAGNOSIS — K219 Gastro-esophageal reflux disease without esophagitis: Secondary | ICD-10-CM | POA: Diagnosis not present

## 2019-10-09 DIAGNOSIS — M150 Primary generalized (osteo)arthritis: Secondary | ICD-10-CM | POA: Diagnosis not present

## 2019-10-09 DIAGNOSIS — M0579 Rheumatoid arthritis with rheumatoid factor of multiple sites without organ or systems involvement: Secondary | ICD-10-CM | POA: Diagnosis not present

## 2019-10-09 DIAGNOSIS — E663 Overweight: Secondary | ICD-10-CM | POA: Diagnosis not present

## 2019-10-09 DIAGNOSIS — M25511 Pain in right shoulder: Secondary | ICD-10-CM | POA: Diagnosis not present

## 2019-10-12 DIAGNOSIS — M9902 Segmental and somatic dysfunction of thoracic region: Secondary | ICD-10-CM | POA: Diagnosis not present

## 2019-10-12 DIAGNOSIS — M9903 Segmental and somatic dysfunction of lumbar region: Secondary | ICD-10-CM | POA: Diagnosis not present

## 2019-10-12 DIAGNOSIS — M546 Pain in thoracic spine: Secondary | ICD-10-CM | POA: Diagnosis not present

## 2019-10-12 DIAGNOSIS — M9905 Segmental and somatic dysfunction of pelvic region: Secondary | ICD-10-CM | POA: Diagnosis not present

## 2019-10-12 DIAGNOSIS — M545 Low back pain: Secondary | ICD-10-CM | POA: Diagnosis not present

## 2019-10-20 DIAGNOSIS — M9902 Segmental and somatic dysfunction of thoracic region: Secondary | ICD-10-CM | POA: Diagnosis not present

## 2019-10-20 DIAGNOSIS — M9905 Segmental and somatic dysfunction of pelvic region: Secondary | ICD-10-CM | POA: Diagnosis not present

## 2019-10-20 DIAGNOSIS — M9903 Segmental and somatic dysfunction of lumbar region: Secondary | ICD-10-CM | POA: Diagnosis not present

## 2019-10-20 DIAGNOSIS — M546 Pain in thoracic spine: Secondary | ICD-10-CM | POA: Diagnosis not present

## 2019-10-20 DIAGNOSIS — M545 Low back pain: Secondary | ICD-10-CM | POA: Diagnosis not present

## 2019-12-20 DIAGNOSIS — H5203 Hypermetropia, bilateral: Secondary | ICD-10-CM | POA: Diagnosis not present

## 2019-12-20 DIAGNOSIS — H52203 Unspecified astigmatism, bilateral: Secondary | ICD-10-CM | POA: Diagnosis not present

## 2019-12-20 DIAGNOSIS — H2513 Age-related nuclear cataract, bilateral: Secondary | ICD-10-CM | POA: Diagnosis not present

## 2019-12-20 DIAGNOSIS — H524 Presbyopia: Secondary | ICD-10-CM | POA: Diagnosis not present

## 2020-01-17 DIAGNOSIS — Z6824 Body mass index (BMI) 24.0-24.9, adult: Secondary | ICD-10-CM | POA: Diagnosis not present

## 2020-01-17 DIAGNOSIS — M25511 Pain in right shoulder: Secondary | ICD-10-CM | POA: Diagnosis not present

## 2020-01-17 DIAGNOSIS — M150 Primary generalized (osteo)arthritis: Secondary | ICD-10-CM | POA: Diagnosis not present

## 2020-01-17 DIAGNOSIS — M0579 Rheumatoid arthritis with rheumatoid factor of multiple sites without organ or systems involvement: Secondary | ICD-10-CM | POA: Diagnosis not present

## 2020-01-17 DIAGNOSIS — K219 Gastro-esophageal reflux disease without esophagitis: Secondary | ICD-10-CM | POA: Diagnosis not present

## 2020-04-18 DIAGNOSIS — K219 Gastro-esophageal reflux disease without esophagitis: Secondary | ICD-10-CM | POA: Diagnosis not present

## 2020-04-18 DIAGNOSIS — Z6823 Body mass index (BMI) 23.0-23.9, adult: Secondary | ICD-10-CM | POA: Diagnosis not present

## 2020-04-18 DIAGNOSIS — M0579 Rheumatoid arthritis with rheumatoid factor of multiple sites without organ or systems involvement: Secondary | ICD-10-CM | POA: Diagnosis not present

## 2020-04-18 DIAGNOSIS — M25511 Pain in right shoulder: Secondary | ICD-10-CM | POA: Diagnosis not present

## 2020-04-18 DIAGNOSIS — M150 Primary generalized (osteo)arthritis: Secondary | ICD-10-CM | POA: Diagnosis not present

## 2020-07-03 DIAGNOSIS — M0579 Rheumatoid arthritis with rheumatoid factor of multiple sites without organ or systems involvement: Secondary | ICD-10-CM | POA: Diagnosis not present

## 2020-07-03 DIAGNOSIS — M150 Primary generalized (osteo)arthritis: Secondary | ICD-10-CM | POA: Diagnosis not present

## 2020-07-03 DIAGNOSIS — K219 Gastro-esophageal reflux disease without esophagitis: Secondary | ICD-10-CM | POA: Diagnosis not present

## 2020-07-03 DIAGNOSIS — M25511 Pain in right shoulder: Secondary | ICD-10-CM | POA: Diagnosis not present

## 2020-07-03 DIAGNOSIS — Z6824 Body mass index (BMI) 24.0-24.9, adult: Secondary | ICD-10-CM | POA: Diagnosis not present

## 2020-07-16 DIAGNOSIS — Z125 Encounter for screening for malignant neoplasm of prostate: Secondary | ICD-10-CM | POA: Diagnosis not present

## 2020-07-16 DIAGNOSIS — Z79899 Other long term (current) drug therapy: Secondary | ICD-10-CM | POA: Diagnosis not present

## 2020-07-16 DIAGNOSIS — I700 Atherosclerosis of aorta: Secondary | ICD-10-CM | POA: Diagnosis not present

## 2020-07-16 DIAGNOSIS — M069 Rheumatoid arthritis, unspecified: Secondary | ICD-10-CM | POA: Diagnosis not present

## 2020-07-16 DIAGNOSIS — J439 Emphysema, unspecified: Secondary | ICD-10-CM | POA: Diagnosis not present

## 2020-07-23 DIAGNOSIS — Z6824 Body mass index (BMI) 24.0-24.9, adult: Secondary | ICD-10-CM | POA: Diagnosis not present

## 2020-07-23 DIAGNOSIS — J439 Emphysema, unspecified: Secondary | ICD-10-CM | POA: Diagnosis not present

## 2020-07-23 DIAGNOSIS — R079 Chest pain, unspecified: Secondary | ICD-10-CM | POA: Diagnosis not present

## 2020-07-23 DIAGNOSIS — I700 Atherosclerosis of aorta: Secondary | ICD-10-CM | POA: Diagnosis not present

## 2020-07-23 DIAGNOSIS — Z720 Tobacco use: Secondary | ICD-10-CM | POA: Diagnosis not present

## 2020-07-23 DIAGNOSIS — Z23 Encounter for immunization: Secondary | ICD-10-CM | POA: Diagnosis not present

## 2020-08-07 DIAGNOSIS — M542 Cervicalgia: Secondary | ICD-10-CM | POA: Diagnosis not present

## 2020-08-07 DIAGNOSIS — M9902 Segmental and somatic dysfunction of thoracic region: Secondary | ICD-10-CM | POA: Diagnosis not present

## 2020-08-07 DIAGNOSIS — M9901 Segmental and somatic dysfunction of cervical region: Secondary | ICD-10-CM | POA: Diagnosis not present

## 2020-08-07 DIAGNOSIS — M546 Pain in thoracic spine: Secondary | ICD-10-CM | POA: Diagnosis not present

## 2020-09-19 DIAGNOSIS — D2361 Other benign neoplasm of skin of right upper limb, including shoulder: Secondary | ICD-10-CM | POA: Diagnosis not present

## 2020-09-19 DIAGNOSIS — D1801 Hemangioma of skin and subcutaneous tissue: Secondary | ICD-10-CM | POA: Diagnosis not present

## 2020-09-19 DIAGNOSIS — L814 Other melanin hyperpigmentation: Secondary | ICD-10-CM | POA: Diagnosis not present

## 2020-09-19 DIAGNOSIS — L570 Actinic keratosis: Secondary | ICD-10-CM | POA: Diagnosis not present

## 2020-09-19 DIAGNOSIS — D045 Carcinoma in situ of skin of trunk: Secondary | ICD-10-CM | POA: Diagnosis not present

## 2020-09-19 DIAGNOSIS — L821 Other seborrheic keratosis: Secondary | ICD-10-CM | POA: Diagnosis not present

## 2020-09-23 ENCOUNTER — Other Ambulatory Visit: Payer: Self-pay

## 2020-09-23 ENCOUNTER — Other Ambulatory Visit (HOSPITAL_COMMUNITY): Payer: Self-pay | Admitting: Internal Medicine

## 2020-09-23 ENCOUNTER — Ambulatory Visit (HOSPITAL_COMMUNITY)
Admission: RE | Admit: 2020-09-23 | Discharge: 2020-09-23 | Disposition: A | Discharge status: Home / Self Care | Payer: PPO | Source: Ambulatory Visit | Attending: Internal Medicine | Admitting: Internal Medicine

## 2020-09-23 DIAGNOSIS — M533 Sacrococcygeal disorders, not elsewhere classified: Secondary | ICD-10-CM

## 2020-09-23 DIAGNOSIS — M5418 Radiculopathy, sacral and sacrococcygeal region: Secondary | ICD-10-CM | POA: Diagnosis not present

## 2020-09-23 DIAGNOSIS — J019 Acute sinusitis, unspecified: Secondary | ICD-10-CM | POA: Diagnosis not present

## 2020-10-02 DIAGNOSIS — D045 Carcinoma in situ of skin of trunk: Secondary | ICD-10-CM | POA: Diagnosis not present

## 2020-10-02 DIAGNOSIS — D048 Carcinoma in situ of skin of other sites: Secondary | ICD-10-CM | POA: Diagnosis not present

## 2020-10-15 DIAGNOSIS — M0579 Rheumatoid arthritis with rheumatoid factor of multiple sites without organ or systems involvement: Secondary | ICD-10-CM | POA: Diagnosis not present

## 2020-10-26 DIAGNOSIS — M150 Primary generalized (osteo)arthritis: Secondary | ICD-10-CM | POA: Diagnosis not present

## 2020-10-26 DIAGNOSIS — M0579 Rheumatoid arthritis with rheumatoid factor of multiple sites without organ or systems involvement: Secondary | ICD-10-CM | POA: Diagnosis not present

## 2020-10-26 DIAGNOSIS — J449 Chronic obstructive pulmonary disease, unspecified: Secondary | ICD-10-CM | POA: Diagnosis not present

## 2020-10-26 DIAGNOSIS — K219 Gastro-esophageal reflux disease without esophagitis: Secondary | ICD-10-CM | POA: Diagnosis not present

## 2020-12-25 DIAGNOSIS — H524 Presbyopia: Secondary | ICD-10-CM | POA: Diagnosis not present

## 2020-12-25 DIAGNOSIS — H25813 Combined forms of age-related cataract, bilateral: Secondary | ICD-10-CM | POA: Diagnosis not present

## 2020-12-25 DIAGNOSIS — H5203 Hypermetropia, bilateral: Secondary | ICD-10-CM | POA: Diagnosis not present

## 2020-12-25 DIAGNOSIS — H52203 Unspecified astigmatism, bilateral: Secondary | ICD-10-CM | POA: Diagnosis not present

## 2020-12-26 DIAGNOSIS — M0579 Rheumatoid arthritis with rheumatoid factor of multiple sites without organ or systems involvement: Secondary | ICD-10-CM | POA: Diagnosis not present

## 2020-12-26 DIAGNOSIS — M150 Primary generalized (osteo)arthritis: Secondary | ICD-10-CM | POA: Diagnosis not present

## 2020-12-26 DIAGNOSIS — K219 Gastro-esophageal reflux disease without esophagitis: Secondary | ICD-10-CM | POA: Diagnosis not present

## 2020-12-26 DIAGNOSIS — J449 Chronic obstructive pulmonary disease, unspecified: Secondary | ICD-10-CM | POA: Diagnosis not present

## 2021-01-06 DIAGNOSIS — K219 Gastro-esophageal reflux disease without esophagitis: Secondary | ICD-10-CM | POA: Diagnosis not present

## 2021-01-06 DIAGNOSIS — M150 Primary generalized (osteo)arthritis: Secondary | ICD-10-CM | POA: Diagnosis not present

## 2021-01-06 DIAGNOSIS — E7849 Other hyperlipidemia: Secondary | ICD-10-CM | POA: Diagnosis not present

## 2021-01-06 DIAGNOSIS — M25511 Pain in right shoulder: Secondary | ICD-10-CM | POA: Diagnosis not present

## 2021-01-06 DIAGNOSIS — M0579 Rheumatoid arthritis with rheumatoid factor of multiple sites without organ or systems involvement: Secondary | ICD-10-CM | POA: Diagnosis not present

## 2021-01-06 DIAGNOSIS — Z6823 Body mass index (BMI) 23.0-23.9, adult: Secondary | ICD-10-CM | POA: Diagnosis not present

## 2021-02-06 DIAGNOSIS — R197 Diarrhea, unspecified: Secondary | ICD-10-CM | POA: Diagnosis not present

## 2021-02-14 DIAGNOSIS — M546 Pain in thoracic spine: Secondary | ICD-10-CM | POA: Diagnosis not present

## 2021-02-14 DIAGNOSIS — M542 Cervicalgia: Secondary | ICD-10-CM | POA: Diagnosis not present

## 2021-02-14 DIAGNOSIS — M9901 Segmental and somatic dysfunction of cervical region: Secondary | ICD-10-CM | POA: Diagnosis not present

## 2021-02-14 DIAGNOSIS — M9902 Segmental and somatic dysfunction of thoracic region: Secondary | ICD-10-CM | POA: Diagnosis not present

## 2021-03-12 DIAGNOSIS — L570 Actinic keratosis: Secondary | ICD-10-CM | POA: Diagnosis not present

## 2021-03-12 DIAGNOSIS — Z85828 Personal history of other malignant neoplasm of skin: Secondary | ICD-10-CM | POA: Diagnosis not present

## 2021-03-12 DIAGNOSIS — L821 Other seborrheic keratosis: Secondary | ICD-10-CM | POA: Diagnosis not present

## 2021-04-10 DIAGNOSIS — M0579 Rheumatoid arthritis with rheumatoid factor of multiple sites without organ or systems involvement: Secondary | ICD-10-CM | POA: Diagnosis not present

## 2021-04-28 DIAGNOSIS — M150 Primary generalized (osteo)arthritis: Secondary | ICD-10-CM | POA: Diagnosis not present

## 2021-04-28 DIAGNOSIS — K219 Gastro-esophageal reflux disease without esophagitis: Secondary | ICD-10-CM | POA: Diagnosis not present

## 2021-04-28 DIAGNOSIS — M0579 Rheumatoid arthritis with rheumatoid factor of multiple sites without organ or systems involvement: Secondary | ICD-10-CM | POA: Diagnosis not present

## 2021-04-28 DIAGNOSIS — J449 Chronic obstructive pulmonary disease, unspecified: Secondary | ICD-10-CM | POA: Diagnosis not present

## 2021-05-22 ENCOUNTER — Emergency Department (HOSPITAL_BASED_OUTPATIENT_CLINIC_OR_DEPARTMENT_OTHER)
Admission: EM | Admit: 2021-05-22 | Discharge: 2021-05-22 | Disposition: A | Discharge status: Home / Self Care | Payer: PPO | Attending: Emergency Medicine | Admitting: Emergency Medicine

## 2021-05-22 ENCOUNTER — Other Ambulatory Visit: Payer: Self-pay

## 2021-05-22 ENCOUNTER — Encounter (HOSPITAL_BASED_OUTPATIENT_CLINIC_OR_DEPARTMENT_OTHER): Payer: Self-pay | Admitting: Emergency Medicine

## 2021-05-22 DIAGNOSIS — Z20822 Contact with and (suspected) exposure to covid-19: Secondary | ICD-10-CM | POA: Insufficient documentation

## 2021-05-22 DIAGNOSIS — F17210 Nicotine dependence, cigarettes, uncomplicated: Secondary | ICD-10-CM | POA: Diagnosis not present

## 2021-05-22 DIAGNOSIS — J101 Influenza due to other identified influenza virus with other respiratory manifestations: Secondary | ICD-10-CM | POA: Diagnosis not present

## 2021-05-22 DIAGNOSIS — R059 Cough, unspecified: Secondary | ICD-10-CM | POA: Diagnosis present

## 2021-05-22 LAB — RESP PANEL BY RT-PCR (FLU A&B, COVID) ARPGX2
Influenza A by PCR: POSITIVE — AB
Influenza B by PCR: NEGATIVE
SARS Coronavirus 2 by RT PCR: NEGATIVE

## 2021-05-22 MED ORDER — GUAIFENESIN ER 600 MG PO TB12: 600.0000 mg | ORAL_TABLET | Freq: Two times a day (BID) | ORAL | 0 refills | Status: DC

## 2021-05-22 MED ORDER — BENZONATATE 100 MG PO CAPS: 100.0000 mg | ORAL_CAPSULE | Freq: Three times a day (TID) | ORAL | 0 refills | Status: DC

## 2021-05-22 MED ORDER — GUAIFENESIN ER 600 MG PO TB12: 1200.0000 mg | ORAL_TABLET | Freq: Two times a day (BID) | ORAL | Status: DC

## 2021-05-22 MED ORDER — OSELTAMIVIR PHOSPHATE 75 MG PO CAPS: 75.0000 mg | ORAL_CAPSULE | Freq: Two times a day (BID) | ORAL | 0 refills | Status: DC

## 2021-05-22 NOTE — ED Notes (Signed)
Dc instructions reviewed with patient. Patient voiced understanding. Dc with belongings.  °

## 2021-05-22 NOTE — ED Provider Notes (Signed)
[facility] EMERGENCY DEPT Provider Note   CSN: [id number] Arrival date & time: 05/22/21  2671     History Chief Complaint  Patient presents with   Cough    Jonathan Castro is a 65 y.o. male.  HPI    65 year old male comes in with chief complaint of cough. Patient started having cough on Monday.  Cough is producing phlegm and he feels congested over his chest.  He has been having body aches, chills and weakness.  Was unable to sleep because of severe cough.  History reviewed. No pertinent past medical history.  There are no problems to display for this patient.   Past Surgical History:  Procedure Laterality Date   ROTATOR CUFF REPAIR Left        No family history on file.  Social History   Tobacco Use   Smoking status: Every Day    Packs/day: 1.00    Types: Cigarettes    Passive exposure: Never   Smokeless tobacco: Never  Vaping Use   Vaping Use: Never used  Substance Use Topics   Alcohol use: Yes    Comment: occasional   Drug use: Yes    Frequency: 1.0 times per week    Types: Marijuana    Home Medications Prior to Admission medications   Medication Sig Start Date End Date Taking? Authorizing Provider  guaiFENesin (MUCINEX) 600 MG 12 hr tablet Take 1 tablet (600 mg total) by mouth 2 (two) times daily. 05/22/21  Yes Jessy Cybulski, MD  inFLIXimab (REMICADE) 100 MG injection Inject into the vein. Once week   Yes [provider]  oseltamivir (TAMIFLU) 75 MG capsule Take 1 capsule (75 mg total) by mouth every 12 (twelve) hours. 05/22/21  Yes Varney Biles, MD    Allergies    Penicillins  Review of Systems   Review of Systems  Constitutional:  Positive for activity change.  HENT:  Positive for congestion.   Respiratory:  Positive for cough.   Gastrointestinal:  Negative for nausea and vomiting.   Physical Exam Updated Vital Signs BP (!) 149/72   Pulse 83   Temp 99.3 F (37.4 C) (Oral)   Resp 18   Ht 5\' 7"  (1.702 m)    Wt 68 kg   SpO2 100%   BMI 23.49 kg/m   Physical Exam Vitals and nursing note reviewed.  Constitutional:      Appearance: He is well-developed.  HENT:     Head: Atraumatic.     Nose: Congestion present.  Cardiovascular:     Rate and Rhythm: Normal rate.  Pulmonary:     Effort: Pulmonary effort is normal.  Musculoskeletal:     Cervical back: Neck supple.  Skin:    General: Skin is warm.  Neurological:     Mental Status: He is alert and oriented to person, place, and time.    ED Results / Procedures / Treatments   Labs (all labs ordered are listed, but only abnormal results are displayed) Labs Reviewed  RESP PANEL BY RT-PCR (FLU A&B, COVID) ARPGX2 - Abnormal; Notable for the following components:      Result Value   Influenza A by PCR POSITIVE (*)    All other components within normal limits    EKG None  Radiology No results found.  Procedures Procedures   Medications Ordered in ED Medications  guaiFENesin (MUCINEX) 12 hr tablet 1,200 mg (has no administration in time range)    ED Course  I have reviewed the triage vital  signs and the nursing notes.  Pertinent labs & imaging results that were available during my care of the patient were reviewed by me and considered in my medical decision making (see chart for details).    MDM Rules/Calculators/A&P                           65 year old male comes in with chief complaint of cough, congestion, weakness.  Flu test was ordered and it is positive.  He is not in respiratory distress.  Patient is vaccinated against flu, but given his high risk of age over 67, we did discuss Tamiflu.  Patient prefers getting the Tamiflu prescription which has been provided.  Strict ER return precautions discussed.  Final Clinical Impression(s) / ED Diagnoses Final diagnoses:  Influenza A    Rx / DC Orders ED Discharge Orders          Ordered    guaiFENesin (MUCINEX) 600 MG 12 hr tablet  2 times daily        05/22/21 1036     oseltamivir (TAMIFLU) 75 MG capsule  Every 12 hours        05/22/21 1036             Varney Biles, MD 05/22/21 1040

## 2021-05-22 NOTE — ED Triage Notes (Signed)
Sore throat also noted.

## 2021-05-22 NOTE — Discharge Instructions (Signed)
You tested positive for flu. At your request, Tamiflu has been prescribed given that your age over 48.  Read the instructions provided on it. Mucinex is to break the congestion. Take over-the-counter Tylenol for aches and fevers.  Return to the ER if your symptoms get worse.

## 2021-05-22 NOTE — ED Triage Notes (Signed)
Coughing since Monday, productive with phlegm. No fever per patient. Tylenol and robitussin with no relief.not able to sleep

## 2021-07-04 ENCOUNTER — Other Ambulatory Visit (HOSPITAL_COMMUNITY): Payer: Self-pay | Admitting: Internal Medicine

## 2021-07-04 ENCOUNTER — Other Ambulatory Visit: Payer: Self-pay

## 2021-07-04 ENCOUNTER — Ambulatory Visit (HOSPITAL_COMMUNITY)
Admission: RE | Admit: 2021-07-04 | Discharge: 2021-07-04 | Disposition: A | Discharge status: Home / Self Care | Payer: PPO | Source: Ambulatory Visit | Attending: Internal Medicine | Admitting: Internal Medicine

## 2021-07-04 DIAGNOSIS — M542 Cervicalgia: Secondary | ICD-10-CM

## 2021-07-04 DIAGNOSIS — M2578 Osteophyte, vertebrae: Secondary | ICD-10-CM | POA: Diagnosis not present

## 2021-07-04 DIAGNOSIS — M0579 Rheumatoid arthritis with rheumatoid factor of multiple sites without organ or systems involvement: Secondary | ICD-10-CM | POA: Diagnosis not present

## 2021-07-09 DIAGNOSIS — K219 Gastro-esophageal reflux disease without esophagitis: Secondary | ICD-10-CM | POA: Diagnosis not present

## 2021-07-09 DIAGNOSIS — E7849 Other hyperlipidemia: Secondary | ICD-10-CM | POA: Diagnosis not present

## 2021-07-09 DIAGNOSIS — M0579 Rheumatoid arthritis with rheumatoid factor of multiple sites without organ or systems involvement: Secondary | ICD-10-CM | POA: Diagnosis not present

## 2021-07-09 DIAGNOSIS — M25511 Pain in right shoulder: Secondary | ICD-10-CM | POA: Diagnosis not present

## 2021-07-09 DIAGNOSIS — M5030 Other cervical disc degeneration, unspecified cervical region: Secondary | ICD-10-CM | POA: Diagnosis not present

## 2021-07-09 DIAGNOSIS — M150 Primary generalized (osteo)arthritis: Secondary | ICD-10-CM | POA: Diagnosis not present

## 2021-07-09 DIAGNOSIS — Z6824 Body mass index (BMI) 24.0-24.9, adult: Secondary | ICD-10-CM | POA: Diagnosis not present

## 2021-07-22 DIAGNOSIS — Z125 Encounter for screening for malignant neoplasm of prostate: Secondary | ICD-10-CM | POA: Diagnosis not present

## 2021-07-22 DIAGNOSIS — J439 Emphysema, unspecified: Secondary | ICD-10-CM | POA: Diagnosis not present

## 2021-07-22 DIAGNOSIS — I700 Atherosclerosis of aorta: Secondary | ICD-10-CM | POA: Diagnosis not present

## 2021-07-22 DIAGNOSIS — Z79899 Other long term (current) drug therapy: Secondary | ICD-10-CM | POA: Diagnosis not present

## 2021-07-22 DIAGNOSIS — M069 Rheumatoid arthritis, unspecified: Secondary | ICD-10-CM | POA: Diagnosis not present

## 2021-07-27 DIAGNOSIS — J449 Chronic obstructive pulmonary disease, unspecified: Secondary | ICD-10-CM | POA: Diagnosis not present

## 2021-07-27 DIAGNOSIS — M150 Primary generalized (osteo)arthritis: Secondary | ICD-10-CM | POA: Diagnosis not present

## 2021-07-27 DIAGNOSIS — M0579 Rheumatoid arthritis with rheumatoid factor of multiple sites without organ or systems involvement: Secondary | ICD-10-CM | POA: Diagnosis not present

## 2021-07-27 DIAGNOSIS — K219 Gastro-esophageal reflux disease without esophagitis: Secondary | ICD-10-CM | POA: Diagnosis not present

## 2021-07-29 DIAGNOSIS — Z23 Encounter for immunization: Secondary | ICD-10-CM | POA: Diagnosis not present

## 2021-07-29 DIAGNOSIS — Z6823 Body mass index (BMI) 23.0-23.9, adult: Secondary | ICD-10-CM | POA: Diagnosis not present

## 2021-07-29 DIAGNOSIS — J430 Unilateral pulmonary emphysema [MacLeod's syndrome]: Secondary | ICD-10-CM | POA: Diagnosis not present

## 2021-07-29 DIAGNOSIS — I2510 Atherosclerotic heart disease of native coronary artery without angina pectoris: Secondary | ICD-10-CM | POA: Diagnosis not present

## 2021-07-29 DIAGNOSIS — I700 Atherosclerosis of aorta: Secondary | ICD-10-CM | POA: Diagnosis not present

## 2021-08-11 ENCOUNTER — Other Ambulatory Visit (HOSPITAL_COMMUNITY): Payer: Self-pay | Admitting: Internal Medicine

## 2021-08-11 ENCOUNTER — Other Ambulatory Visit: Payer: Self-pay | Admitting: Internal Medicine

## 2021-08-11 DIAGNOSIS — F17210 Nicotine dependence, cigarettes, uncomplicated: Secondary | ICD-10-CM

## 2021-08-11 DIAGNOSIS — J439 Emphysema, unspecified: Secondary | ICD-10-CM

## 2021-08-18 DIAGNOSIS — M9901 Segmental and somatic dysfunction of cervical region: Secondary | ICD-10-CM | POA: Diagnosis not present

## 2021-08-18 DIAGNOSIS — M546 Pain in thoracic spine: Secondary | ICD-10-CM | POA: Diagnosis not present

## 2021-08-18 DIAGNOSIS — M542 Cervicalgia: Secondary | ICD-10-CM | POA: Diagnosis not present

## 2021-08-18 DIAGNOSIS — M9902 Segmental and somatic dysfunction of thoracic region: Secondary | ICD-10-CM | POA: Diagnosis not present

## 2021-08-25 ENCOUNTER — Other Ambulatory Visit: Payer: Self-pay

## 2021-08-25 ENCOUNTER — Ambulatory Visit
Admission: EM | Admit: 2021-08-25 | Discharge: 2021-08-25 | Disposition: A | Discharge status: Home / Self Care | Payer: PPO | Attending: Urgent Care | Admitting: Urgent Care

## 2021-08-25 DIAGNOSIS — M9901 Segmental and somatic dysfunction of cervical region: Secondary | ICD-10-CM | POA: Diagnosis not present

## 2021-08-25 DIAGNOSIS — M546 Pain in thoracic spine: Secondary | ICD-10-CM | POA: Diagnosis not present

## 2021-08-25 DIAGNOSIS — H6983 Other specified disorders of Eustachian tube, bilateral: Secondary | ICD-10-CM

## 2021-08-25 DIAGNOSIS — J309 Allergic rhinitis, unspecified: Secondary | ICD-10-CM | POA: Diagnosis not present

## 2021-08-25 DIAGNOSIS — H9202 Otalgia, left ear: Secondary | ICD-10-CM

## 2021-08-25 DIAGNOSIS — H65192 Other acute nonsuppurative otitis media, left ear: Secondary | ICD-10-CM | POA: Diagnosis not present

## 2021-08-25 DIAGNOSIS — M542 Cervicalgia: Secondary | ICD-10-CM | POA: Diagnosis not present

## 2021-08-25 DIAGNOSIS — M9902 Segmental and somatic dysfunction of thoracic region: Secondary | ICD-10-CM | POA: Diagnosis not present

## 2021-08-25 MED ORDER — PSEUDOEPHEDRINE HCL 60 MG PO TABS: 60.0000 mg | ORAL_TABLET | Freq: Three times a day (TID) | ORAL | 0 refills | Status: DC | PRN

## 2021-08-25 MED ORDER — LEVOCETIRIZINE DIHYDROCHLORIDE 5 MG PO TABS: 5.0000 mg | ORAL_TABLET | Freq: Every evening | ORAL | 0 refills | Status: DC

## 2021-08-25 MED ORDER — CEFDINIR 300 MG PO CAPS: 300.0000 mg | ORAL_CAPSULE | Freq: Two times a day (BID) | ORAL | 0 refills | Status: DC

## 2021-08-25 NOTE — ED Provider Notes (Signed)
[facility]   MRN: [id number] DOB: [birth year]  Subjective:   Jonathan Castro is a 66 y.o. male presenting for 3-day history of persistent worsening left ear pain, popping, fullness.  Patient has a history of allergic rhinitis, is also a smoker.  Denies fever, runny or stuffy nose, throat pain, drainage, coughing, dizziness, tinnitus.  No current facility-administered medications for this encounter.  Current Outpatient Medications:    benzonatate (TESSALON) 100 MG capsule, Take 1 capsule (100 mg total) by mouth every 8 (eight) hours., Disp: 21 capsule, Rfl: 0   guaiFENesin (MUCINEX) 600 MG 12 hr tablet, Take 1 tablet (600 mg total) by mouth 2 (two) times daily., Disp: 14 tablet, Rfl: 0   inFLIXimab (REMICADE) 100 MG injection, Inject into the vein. Once week, Disp: , Rfl:    oseltamivir (TAMIFLU) 75 MG capsule, Take 1 capsule (75 mg total) by mouth every 12 (twelve) hours., Disp: 10 capsule, Rfl: 0   Allergies  Allergen Reactions   Penicillins     History reviewed. No pertinent past medical history.   Past Surgical History:  Procedure Laterality Date   ROTATOR CUFF REPAIR Left     History reviewed. No pertinent family history.  Social History   Tobacco Use   Smoking status: Every Day    Packs/day: 1.00    Types: Cigarettes    Passive exposure: Never   Smokeless tobacco: Never  Vaping Use   Vaping Use: Never used  Substance Use Topics   Alcohol use: Yes    Comment: occasional   Drug use: Yes    Frequency: 1.0 times per week    Types: Marijuana    ROS   Objective:   Vitals: BP (!) 145/79    Pulse 70    Temp 97.9 F (36.6 C)    Resp 18    SpO2 97%   Physical Exam Constitutional:      General: He is not in acute distress.    Appearance: Normal appearance. He is normal weight. He is not ill-appearing, toxic-appearing or diaphoretic.  HENT:     Head: Normocephalic and atraumatic.     Right Ear: Ear canal and external ear normal. There is no  impacted cerumen.     Left Ear: Ear canal and external ear normal. There is no impacted cerumen.     Ears:     Comments: Left TM with erythema.  Air-fluid level of the TM on the right side.  Both are intact.    Nose: Nose normal. No congestion or rhinorrhea.     Mouth/Throat:     Mouth: Mucous membranes are moist.     Pharynx: No oropharyngeal exudate or posterior oropharyngeal erythema.  Eyes:     General: No scleral icterus.       Right eye: No discharge.        Left eye: No discharge.     Extraocular Movements: Extraocular movements intact.     Conjunctiva/sclera: Conjunctivae normal.  Cardiovascular:     Rate and Rhythm: Normal rate.  Pulmonary:     Effort: Pulmonary effort is normal.  Musculoskeletal:     Cervical back: Normal range of motion and neck supple. No rigidity. No muscular tenderness.  Neurological:     General: No focal deficit present.     Mental Status: He is alert and oriented to person, place, and time.  Psychiatric:        Mood and Affect: Mood normal.        Behavior: Behavior  normal.    Assessment and Plan :   PDMP not reviewed this encounter.  1. Other non-recurrent acute nonsuppurative otitis media of left ear   2. Left ear pain   3. Allergic rhinitis, unspecified seasonality, unspecified trigger   4. Dysfunction of both eustachian tubes    Suspect underlying eustachian tube dysfunction secondary to allergic rhinitis and therefore recommended long-term use of Xyzal and pseudoephedrine as needed.  However, given physical exam findings, will address a concurrent otitis media with cefdinir.  It is okay as patient has a history of hives that is remote with penicillins. Counseled patient on potential for adverse effects with medications prescribed/recommended today, ER and return-to-clinic precautions discussed, patient verbalized understanding.    Jaynee Eagles, PA-C 08/25/21 1055

## 2021-08-25 NOTE — ED Triage Notes (Signed)
Pt presents with left ear pain that began on Friday

## 2021-08-26 DIAGNOSIS — T7840XA Allergy, unspecified, initial encounter: Secondary | ICD-10-CM | POA: Diagnosis not present

## 2021-08-26 DIAGNOSIS — N400 Enlarged prostate without lower urinary tract symptoms: Secondary | ICD-10-CM | POA: Diagnosis not present

## 2021-08-29 ENCOUNTER — Ambulatory Visit: Payer: PPO

## 2021-09-01 ENCOUNTER — Ambulatory Visit (HOSPITAL_COMMUNITY)
Admission: RE | Admit: 2021-09-01 | Discharge: 2021-09-01 | Disposition: A | Discharge status: Home / Self Care | Payer: PPO | Source: Ambulatory Visit | Attending: Internal Medicine | Admitting: Internal Medicine

## 2021-09-01 ENCOUNTER — Other Ambulatory Visit: Payer: Self-pay

## 2021-09-01 DIAGNOSIS — F17210 Nicotine dependence, cigarettes, uncomplicated: Secondary | ICD-10-CM | POA: Insufficient documentation

## 2021-09-01 DIAGNOSIS — J439 Emphysema, unspecified: Secondary | ICD-10-CM | POA: Diagnosis not present

## 2021-09-03 DIAGNOSIS — E785 Hyperlipidemia, unspecified: Secondary | ICD-10-CM | POA: Insufficient documentation

## 2021-09-03 DIAGNOSIS — M5030 Other cervical disc degeneration, unspecified cervical region: Secondary | ICD-10-CM | POA: Insufficient documentation

## 2021-09-03 DIAGNOSIS — M1991 Primary osteoarthritis, unspecified site: Secondary | ICD-10-CM | POA: Insufficient documentation

## 2021-09-03 DIAGNOSIS — H66001 Acute suppurative otitis media without spontaneous rupture of ear drum, right ear: Secondary | ICD-10-CM | POA: Diagnosis not present

## 2021-09-03 DIAGNOSIS — K219 Gastro-esophageal reflux disease without esophagitis: Secondary | ICD-10-CM | POA: Insufficient documentation

## 2021-09-03 DIAGNOSIS — M069 Rheumatoid arthritis, unspecified: Secondary | ICD-10-CM | POA: Insufficient documentation

## 2021-09-03 DIAGNOSIS — R918 Other nonspecific abnormal finding of lung field: Secondary | ICD-10-CM | POA: Diagnosis not present

## 2021-09-30 ENCOUNTER — Institutional Professional Consult (permissible substitution): Payer: PPO | Admitting: Thoracic Surgery (Cardiothoracic Vascular Surgery)

## 2021-09-30 ENCOUNTER — Other Ambulatory Visit: Payer: Self-pay | Admitting: Thoracic Surgery (Cardiothoracic Vascular Surgery)

## 2021-09-30 VITALS — BP 148/88 | HR 74 | Resp 20 | Ht 67.0 in | Wt 158.0 lb

## 2021-09-30 DIAGNOSIS — R911 Solitary pulmonary nodule: Secondary | ICD-10-CM | POA: Diagnosis not present

## 2021-09-30 NOTE — Progress Notes (Signed)
PCP is Jonathan Noble, MD ?Referring Provider is Jonathan Noble, MD ? ?Chief Complaint  ?Patient presents with  ? Lung Lesion  ?  Surgical consult,  ? ? ?HPI: Mr. Jonathan Castro is sent for consultation for a left lung mass Jonathan Castro. ? ?Jonathan Castro is a 66 year old man with a history of tobacco abuse.  Jonathan recently saw Dr. Willey Castro.  Because of his smoking history Jonathan had a low-dose CT for lung cancer screening.  Jonathan had previously been doing those in [place] so we do not have images from those.  Jonathan was found to have a left upper lobe lung mass Jonathan some bulky mediastinal Castro. ? ?Jonathan has is smoking about a pack a day since Jonathan was 66 years old (50 pack years).  Jonathan feels well.  Jonathan denies any chest pain, pressure, tightness, shortness of breath, change in appetite, weight loss, fatigue, fevers, chills, or night sweats. ? ?Jonathan is retired but remains very active. ? ?Jonathan Score: ?At the time of surgery this patient?s most appropriate activity status/level should be described as: ?[x]     0    Normal activity, no symptoms ?[]     1    Restricted in physical strenuous activity but ambulatory, able to do out light work ?[]     2    Ambulatory Jonathan capable of self care, unable to do work activities, up Jonathan about >50 % of waking hours                              ?[]     3    Only limited self care, in bed greater than 50% of waking hours ?[]     4    Completely disabled, no self care, confined to bed or chair ?[]     5    Moribund ? ?No past medical history on file. ? ?Past Surgical History:  ?Procedure Laterality Date  ? ROTATOR CUFF REPAIR Left   ? ? ?No family history on file. ? ?Social History ?Social History  ? ?Tobacco Use  ? Smoking status: Every Day  ?  Packs/day: 1.00  ?  Types: Cigarettes  ?  Passive exposure: Never  ? Smokeless tobacco: Never  ?Vaping Use  ? Vaping Use: Never used  ?Substance Use Topics  ? Alcohol use: Yes  ?  Comment: occasional  ? Drug use: Yes  ?  Frequency: 1.0 times per week  ?  Types: Marijuana   ? ? ?Current Outpatient Medications  ?Medication Sig Dispense Refill  ? benzonatate (TESSALON) 100 MG capsule Take 1 capsule (100 mg total) by mouth every 8 (eight) hours. 21 capsule 0  ? folic acid (FOLVITE) 1 MG tablet Take 1 mg by mouth daily.    ? guaiFENesin (MUCINEX) 600 MG 12 hr tablet Take 1 tablet (600 mg total) by mouth 2 (two) times daily. 14 tablet 0  ? inFLIXimab (REMICADE) 100 MG injection Inject into the vein. Once week    ? levocetirizine (XYZAL) 5 MG tablet Take 1 tablet (5 mg total) by mouth every evening. 90 tablet 0  ? pseudoephedrine (SUDAFED) 60 MG tablet Take 1 tablet (60 mg total) by mouth every 8 (eight) hours as needed for congestion. 30 tablet 0  ? oseltamivir (TAMIFLU) 75 MG capsule Take 1 capsule (75 mg total) by mouth every 12 (twelve) hours. (Patient not taking: Reported on 09/30/2021) 10 capsule 0  ? ?No current facility-administered medications for this visit.  ? ? ?  Allergies  ?Allergen Reactions  ? Leflunomide Diarrhea  ? Penicillamine Other (See Comments)  ? Penicillins   ? ? ?Review of Systems  ?Constitutional:  Negative for activity change, appetite change, chills, fever Jonathan unexpected weight change.  ?HENT:  Negative for trouble swallowing Jonathan voice change.   ?Respiratory:  Negative for cough, shortness of breath Jonathan wheezing.   ?Cardiovascular:  Negative for chest pain.  ?Neurological:  Negative for syncope Jonathan weakness.  ?Hematological:  Negative for Castro. Does not bruise/bleed easily.  ?All other systems reviewed Jonathan are negative. ? ?BP (!) 148/88 (BP Location: Right Arm, Patient Position: Sitting)   Pulse 74   Resp 20   Ht 5\' 7"  (1.702 m)   Wt 158 lb (71.7 kg)   SpO2 96% Comment: RA  BMI 24.75 kg/m?  ?Physical Exam ?Vitals reviewed.  ?Constitutional:   ?   General: Jonathan is not in acute distress. ?   Appearance: Normal appearance.  ?HENT:  ?   Head: Normocephalic Jonathan atraumatic.  ?Eyes:  ?   General: No scleral icterus. ?   Extraocular Movements: Extraocular  movements intact.  ?Neck:  ?   Vascular: No carotid bruit.  ?Cardiovascular:  ?   Rate Jonathan Rhythm: Normal rate Jonathan regular rhythm.  ?   Heart sounds: Normal heart sounds. No murmur heard. ?  No friction rub. No gallop.  ?Pulmonary:  ?   Effort: Pulmonary effort is normal. No respiratory distress.  ?   Breath sounds: Normal breath sounds. No wheezing or rales.  ?Abdominal:  ?   General: There is no distension.  ?   Palpations: Abdomen is soft.  ?Lymphadenopathy:  ?   Cervical: No cervical Castro.  ?Skin: ?   General: Skin is warm Jonathan dry.  ?Neurological:  ?   General: No focal deficit present.  ?   Mental Status: Jonathan Castro, Jonathan Castro, Jonathan time.  ?   Cranial Nerves: No cranial nerve deficit.  ?   Motor: No weakness.  ? ? ?Diagnostic Tests: ?CT CHEST WITHOUT CONTRAST LOW-DOSE FOR LUNG CANCER SCREENING ?  ?TECHNIQUE: ?Multidetector CT imaging of the chest was performed following the ?standard protocol without IV contrast. ?  ?RADIATION DOSE REDUCTION: This exam was performed according to the ?departmental dose-optimization program which includes automated ?exposure control, adjustment of the mA Jonathan/or kV according to ?patient size Jonathan/or use of iterative reconstruction technique. ?  ?COMPARISON:  No priors. ?  ?FINDINGS: ?Cardiovascular: Heart size is normal. There is no significant ?pericardial fluid, thickening or pericardial calcification. There is ?aortic atherosclerosis, as well as atherosclerosis of the great ?vessels of the mediastinum Jonathan the coronary arteries, including ?calcified atherosclerotic plaque in the left main, left anterior ?descending Jonathan left circumflex coronary arteries. ?  ?Mediastinum/Nodes: Multiple enlarged lymph nodes are noted, most ?evident in the low left paratracheal nodal station (1.6 cm in short ?axis), Jonathan in the AP window nodal station (2 cm in short axis), both ?visualized on axial image 26 of series 2. Esophagus is unremarkable ?in appearance. No axillary  lymphadenopathy. ?  ?Lungs/Pleura: Large pulmonary nodule in the central aspect of the ?left upper lobe (axial image 155 of series 3), with a volume derived ?mean diameter estimated at 23.1 mm. No acute consolidative airspace ?disease. No pleural effusions. Diffuse bronchial wall thickening ?with severe centrilobular Jonathan paraseptal emphysema, including ?bullous disease in the upper lobes of the lungs bilaterally (right ?greater than left). ?  ?Upper Abdomen: Aortic atherosclerosis. ?  ?Musculoskeletal: There  are no aggressive appearing lytic or blastic ?lesions noted in the visualized portions of the skeleton. ?  ?IMPRESSION: ?1. Large central left upper lobe pulmonary nodule with AP window Jonathan ?left paratracheal lymphadenopathy, highly concerning for primary ?lung cancer with nodal metastasis, categorized as Lung-RADS 4XS, ?highly suspicious. Additional imaging evaluation or consultation ?with Pulmonology or Thoracic Surgery recommended. ?2. The "S" modifier above refers to potentially clinically ?significant non lung cancer related findings. Specifically, there is ?aortic atherosclerosis, in addition to left main Jonathan 2 vessel ?coronary artery disease. Please note that although the presence of ?coronary artery calcium documents the presence of coronary artery ?disease, the severity of this disease Jonathan any potential stenosis ?cannot be assessed on this non-gated CT examination. Assessment for ?potential risk factor modification, dietary therapy or pharmacologic ?therapy may be warranted, if clinically indicated. ?3. Mild diffuse bronchial wall thickening with severe centrilobular ?Jonathan paraseptal emphysema; imaging findings suggestive of underlying ?COPD. ?  ?These results will be called to the ordering clinician or ?representative by the Radiologist Assistant, Jonathan communication ?documented in the PACS or [employer]. ?  ?Aortic Atherosclerosis (ICD10-I70.0) Jonathan Emphysema (ICD10-J43.9). ?  ?  ?Electronically  Signed ?  By: Vinnie Langton M.D. ?  On: 09/02/2021 06:45 ?I personally reviewed the CT images.  There is a left upper lobe lung nodule with bulky AP window Jonathan Castro. ? ? ?Impression: ?Jonathan Castro is a

## 2021-10-01 ENCOUNTER — Encounter: Payer: Self-pay | Admitting: Internal Medicine

## 2021-10-09 ENCOUNTER — Encounter (HOSPITAL_COMMUNITY)
Admission: RE | Admit: 2021-10-09 | Discharge: 2021-10-09 | Disposition: A | Discharge status: Home / Self Care | Payer: PPO | Source: Ambulatory Visit | Attending: Thoracic Surgery (Cardiothoracic Vascular Surgery) | Admitting: Thoracic Surgery (Cardiothoracic Vascular Surgery)

## 2021-10-09 DIAGNOSIS — J432 Centrilobular emphysema: Secondary | ICD-10-CM | POA: Diagnosis not present

## 2021-10-09 DIAGNOSIS — N281 Cyst of kidney, acquired: Secondary | ICD-10-CM | POA: Diagnosis not present

## 2021-10-09 DIAGNOSIS — R911 Solitary pulmonary nodule: Secondary | ICD-10-CM | POA: Diagnosis not present

## 2021-10-09 DIAGNOSIS — Z85118 Personal history of other malignant neoplasm of bronchus and lung: Secondary | ICD-10-CM | POA: Diagnosis not present

## 2021-10-09 DIAGNOSIS — I2510 Atherosclerotic heart disease of native coronary artery without angina pectoris: Secondary | ICD-10-CM | POA: Diagnosis not present

## 2021-10-09 MED ORDER — FLUDEOXYGLUCOSE F - 18 (FDG) INJECTION
8.5900 | Freq: Once | INTRAVENOUS | Status: AC | PRN
  Administered 2021-10-09: 8.59 via INTRAVENOUS

## 2021-10-10 DIAGNOSIS — M9901 Segmental and somatic dysfunction of cervical region: Secondary | ICD-10-CM | POA: Diagnosis not present

## 2021-10-10 DIAGNOSIS — M9902 Segmental and somatic dysfunction of thoracic region: Secondary | ICD-10-CM | POA: Diagnosis not present

## 2021-10-10 DIAGNOSIS — M62830 Muscle spasm of back: Secondary | ICD-10-CM | POA: Diagnosis not present

## 2021-10-10 DIAGNOSIS — M546 Pain in thoracic spine: Secondary | ICD-10-CM | POA: Diagnosis not present

## 2021-10-10 DIAGNOSIS — M542 Cervicalgia: Secondary | ICD-10-CM | POA: Diagnosis not present

## 2021-10-10 DIAGNOSIS — M9903 Segmental and somatic dysfunction of lumbar region: Secondary | ICD-10-CM | POA: Diagnosis not present

## 2021-10-14 ENCOUNTER — Other Ambulatory Visit: Payer: Self-pay | Admitting: *Deleted

## 2021-10-14 ENCOUNTER — Ambulatory Visit: Payer: PPO | Admitting: Thoracic Surgery (Cardiothoracic Vascular Surgery)

## 2021-10-14 ENCOUNTER — Encounter: Payer: Self-pay | Admitting: Thoracic Surgery (Cardiothoracic Vascular Surgery)

## 2021-10-14 VITALS — BP 139/86 | HR 75 | Resp 20 | Ht 67.0 in | Wt 159.0 lb

## 2021-10-14 DIAGNOSIS — R911 Solitary pulmonary nodule: Secondary | ICD-10-CM

## 2021-10-14 NOTE — Progress Notes (Signed)
? ?   ?[address] ?      [place] [address] ?            [phone]   ? ?HPI Jonathan Castro returns to discuss the results of his PET/CT ? ?Jonathan Castro is a 66 year old man with a history of tobacco abuse and COPD.  He had a low-dose screening CT for lung cancer.  He was found to have a left upper lobe lung mass and AP window and paratracheal adenopathy.  I sent him for a PET/CT and he now returns to discuss the results. ? ?He is feeling well.  No chest pain, shortness of breath, weight loss or fatigue. ? ?History reviewed. No pertinent past medical history. ? ?Current Outpatient Medications  ?Medication Sig Dispense Refill  ? folic acid (FOLVITE) 1 MG tablet Take 1 mg by mouth daily.    ? inFLIXimab (REMICADE) 100 MG injection Inject into the vein. Once week    ? benzonatate (TESSALON) 100 MG capsule Take 1 capsule (100 mg total) by mouth every 8 (eight) hours. 21 capsule 0  ? guaiFENesin (MUCINEX) 600 MG 12 hr tablet Take 1 tablet (600 mg total) by mouth 2 (two) times daily. 14 tablet 0  ? levocetirizine (XYZAL) 5 MG tablet Take 1 tablet (5 mg total) by mouth every evening. 90 tablet 0  ? oseltamivir (TAMIFLU) 75 MG capsule Take 1 capsule (75 mg total) by mouth every 12 (twelve) hours. (Patient not taking: Reported on 09/30/2021) 10 capsule 0  ? pseudoephedrine (SUDAFED) 60 MG tablet Take 1 tablet (60 mg total) by mouth every 8 (eight) hours as needed for congestion. 30 tablet 0  ? ?No current facility-administered medications for this visit.  ? ? ?Physical Exam ?BP 139/86 (BP Location: Left Arm, Patient Position: Sitting)   Pulse 75   Resp 20   Ht 5\' 7"  (4.496 m)   Wt 159 lb (72.1 kg)   SpO2 97% Comment: rA  BMI 24.90 kg/m?  ?Well-appearing 66 year old man in no acute distress ? ?Diagnostic Tests: ?NUCLEAR MEDICINE PET SKULL BASE TO THIGH ?  ?TECHNIQUE: ?8.59 mCi F-18 FDG was injected intravenously. Full-ring PET imaging ?was performed from the skull base to thigh after the radiotracer. CT ?data  was obtained and used for attenuation correction and anatomic ?localization. ?  ?Fasting blood glucose: 84 mg/dl ?  ?COMPARISON:  Chest CT 09/01/2021.  Chest radiographs 12/16/2018. ?  ?FINDINGS: ?Mediastinal blood pool activity: SUV max 1.9 ?  ?NECK: ?  ?No hypermetabolic cervical lymph nodes are identified.There are no ?lesions of the pharyngeal mucosal space. ?  ?Incidental CT findings: none ?  ?CHEST: ?  ?Intensely hypermetabolic left hilar mass (SUV max 10.5). There are ?adjacent hypermetabolic left hilar and AP window lymph nodes. AP ?window nodes measure 1.6 cm on image 107/3 and 1.9 cm on image 108/3 ?(SUV max 11.8). No contralateral hypermetabolic mediastinal or hilar ?adenopathy. No peripheral hypermetabolic pulmonary activity or ?suspicious peripheral lung nodularity. ?  ?Incidental CT findings: Mild coronary and aortic atherosclerosis. ?Severe centrilobular and paraseptal emphysema with mild diffuse ?central airway thickening. ?  ?ABDOMEN/PELVIS: ?  ?There is no hypermetabolic activity within the liver, adrenal ?glands, spleen or pancreas. There is no hypermetabolic nodal ?activity. ?  ?Incidental CT findings: 3.1 cm cyst in the upper pole of the left ?kidney. Aortoiliac atherosclerosis. Mild enlargement of the prostate ?gland. ?  ?SKELETON: ?  ?There is no hypermetabolic activity to suggest osseous metastatic ?disease. ?  ?Incidental CT findings: none ?  ?  IMPRESSION: ?1. Central left upper lobe/hilar mass with intense hypermetabolic ?activity consistent with bronchogenic carcinoma with hypermetabolic ?AP window and left hilar nodal metastases. No evidence of distant ?metastatic disease. ?2. No suspicious peripheral lung lesions identified. ?3. Coronary and aortic atherosclerosis (ICD10-I70.0). Emphysema ?(ICD10-J43.9). ?  ?  ?Electronically Signed ?  By: Richardean Sale M.D. ?  On: 10/10/2021 17:14 ?I personally reviewed the PET/CT images and concur with the findings noted above. ? ?Impression: ?Jonathan Castro is a 66 year old man with a history of tobacco abuse and COPD who was found to have left hilar lung mass and mediastinal adenopathy on low-dose screening CT for lung cancer.  PET/CT confirmed those findings.  There were no unexpected findings.  Findings are consistent with stage IIIa lung cancer. ? ?I recommended that we proceed with a bronchoscopy and endobronchial ultrasound and possible mediastinoscopy for diagnosis and staging.  I described the procedure to him.  He understands we would do them in the operating room under general anesthesia.  We would only do mediastinoscopy if the bronch and EBUS are nondiagnostic. ? ?I informed him of the indications, risks, benefits, and alternatives.  He understands the risks include, but not limited to death, MI, DVT, PE, bleeding, pneumothorax, and if mediastinoscopy is performed additional risk of bleeding, recurrent nerve injury, esophageal injury in addition to those previously listed. ? ?He accepts the risk and wishes to proceed ? ?Plan: ?Bronchoscopy, endobronchial ultrasound, possible mediastinoscopy.  We will call the patient to schedule. ? ?I spent 20 minutes in review of records, images, and consultation with Jonathan Castro today. ?Melrose Nakayama, MD ?[facility] ?[phone] ? ? ? ?

## 2021-10-14 NOTE — H&P (View-Only) (Signed)
? ?   ?[address] ?      [place] [address] ?            [phone]   ? ?HPI Jonathan Castro returns to discuss the results of his PET/CT ? ?Jonathan Castro is a 66 year old man with a history of tobacco abuse and COPD.  He had a low-dose screening CT for lung cancer.  He was found to have a left upper lobe lung mass and AP window and paratracheal adenopathy.  I sent him for a PET/CT and he now returns to discuss the results. ? ?He is feeling well.  No chest pain, shortness of breath, weight loss or fatigue. ? ?History reviewed. No pertinent past medical history. ? ?Current Outpatient Medications  ?Medication Sig Dispense Refill  ? folic acid (FOLVITE) 1 MG tablet Take 1 mg by mouth daily.    ? inFLIXimab (REMICADE) 100 MG injection Inject into the vein. Once week    ? benzonatate (TESSALON) 100 MG capsule Take 1 capsule (100 mg total) by mouth every 8 (eight) hours. 21 capsule 0  ? guaiFENesin (MUCINEX) 600 MG 12 hr tablet Take 1 tablet (600 mg total) by mouth 2 (two) times daily. 14 tablet 0  ? levocetirizine (XYZAL) 5 MG tablet Take 1 tablet (5 mg total) by mouth every evening. 90 tablet 0  ? oseltamivir (TAMIFLU) 75 MG capsule Take 1 capsule (75 mg total) by mouth every 12 (twelve) hours. (Patient not taking: Reported on 09/30/2021) 10 capsule 0  ? pseudoephedrine (SUDAFED) 60 MG tablet Take 1 tablet (60 mg total) by mouth every 8 (eight) hours as needed for congestion. 30 tablet 0  ? ?No current facility-administered medications for this visit.  ? ? ?Physical Exam ?BP 139/86 (BP Location: Left Arm, Patient Position: Sitting)   Pulse 75   Resp 20   Ht 5\' 7"  (1.702 m)   Wt 159 lb (72.1 kg)   SpO2 97% Comment: rA  BMI 24.90 kg/m?  ?Well-appearing 66 year old man in no acute distress ? ?Diagnostic Tests: ?NUCLEAR MEDICINE PET SKULL BASE TO THIGH ?  ?TECHNIQUE: ?8.59 mCi F-18 FDG was injected intravenously. Full-ring PET imaging ?was performed from the skull base to thigh after the radiotracer. CT ?data  was obtained and used for attenuation correction and anatomic ?localization. ?  ?Fasting blood glucose: 84 mg/dl ?  ?COMPARISON:  Chest CT 09/01/2021.  Chest radiographs 12/16/2018. ?  ?FINDINGS: ?Mediastinal blood pool activity: SUV max 1.9 ?  ?NECK: ?  ?No hypermetabolic cervical lymph nodes are identified.There are no ?lesions of the pharyngeal mucosal space. ?  ?Incidental CT findings: none ?  ?CHEST: ?  ?Intensely hypermetabolic left hilar mass (SUV max 10.5). There are ?adjacent hypermetabolic left hilar and AP window lymph nodes. AP ?window nodes measure 1.6 cm on image 107/3 and 1.9 cm on image 108/3 ?(SUV max 11.8). No contralateral hypermetabolic mediastinal or hilar ?adenopathy. No peripheral hypermetabolic pulmonary activity or ?suspicious peripheral lung nodularity. ?  ?Incidental CT findings: Mild coronary and aortic atherosclerosis. ?Severe centrilobular and paraseptal emphysema with mild diffuse ?central airway thickening. ?  ?ABDOMEN/PELVIS: ?  ?There is no hypermetabolic activity within the liver, adrenal ?glands, spleen or pancreas. There is no hypermetabolic nodal ?activity. ?  ?Incidental CT findings: 3.1 cm cyst in the upper pole of the left ?kidney. Aortoiliac atherosclerosis. Mild enlargement of the prostate ?gland. ?  ?SKELETON: ?  ?There is no hypermetabolic activity to suggest osseous metastatic ?disease. ?  ?Incidental CT findings: none ?  ?  IMPRESSION: ?1. Central left upper lobe/hilar mass with intense hypermetabolic ?activity consistent with bronchogenic carcinoma with hypermetabolic ?AP window and left hilar nodal metastases. No evidence of distant ?metastatic disease. ?2. No suspicious peripheral lung lesions identified. ?3. Coronary and aortic atherosclerosis (ICD10-I70.0). Emphysema ?(ICD10-J43.9). ?  ?  ?Electronically Signed ?  By: Richardean Sale M.D. ?  On: 10/10/2021 17:14 ?I personally reviewed the PET/CT images and concur with the findings noted above. ? ?Impression: ?Jonathan Castro is a 66 year old man with a history of tobacco abuse and COPD who was found to have left hilar lung mass and mediastinal adenopathy on low-dose screening CT for lung cancer.  PET/CT confirmed those findings.  There were no unexpected findings.  Findings are consistent with stage IIIa lung cancer. ? ?I recommended that we proceed with a bronchoscopy and endobronchial ultrasound and possible mediastinoscopy for diagnosis and staging.  I described the procedure to him.  He understands we would do them in the operating room under general anesthesia.  We would only do mediastinoscopy if the bronch and EBUS are nondiagnostic. ? ?I informed him of the indications, risks, benefits, and alternatives.  He understands the risks include, but not limited to death, MI, DVT, PE, bleeding, pneumothorax, and if mediastinoscopy is performed additional risk of bleeding, recurrent nerve injury, esophageal injury in addition to those previously listed. ? ?He accepts the risk and wishes to proceed ? ?Plan: ?Bronchoscopy, endobronchial ultrasound, possible mediastinoscopy.  We will call the patient to schedule. ? ?I spent 20 minutes in review of records, images, and consultation with Jonathan Castro today. ?Melrose Nakayama, MD ?[facility] ?[phone] ? ? ? ?

## 2021-10-16 ENCOUNTER — Ambulatory Visit (HOSPITAL_COMMUNITY)
Admission: RE | Admit: 2021-10-16 | Discharge: 2021-10-16 | Disposition: A | Discharge status: Home / Self Care | Payer: PPO | Source: Ambulatory Visit | Attending: Thoracic Surgery (Cardiothoracic Vascular Surgery) | Admitting: Thoracic Surgery (Cardiothoracic Vascular Surgery)

## 2021-10-16 ENCOUNTER — Encounter (HOSPITAL_COMMUNITY): Payer: Self-pay

## 2021-10-16 ENCOUNTER — Encounter (HOSPITAL_COMMUNITY)
Admission: RE | Admit: 2021-10-16 | Discharge: 2021-10-16 | Disposition: A | Discharge status: Home / Self Care | Payer: PPO | Source: Ambulatory Visit | Attending: Thoracic Surgery (Cardiothoracic Vascular Surgery) | Admitting: Thoracic Surgery (Cardiothoracic Vascular Surgery)

## 2021-10-16 ENCOUNTER — Other Ambulatory Visit: Payer: Self-pay

## 2021-10-16 DIAGNOSIS — R911 Solitary pulmonary nodule: Secondary | ICD-10-CM | POA: Diagnosis not present

## 2021-10-16 DIAGNOSIS — J45901 Unspecified asthma with (acute) exacerbation: Secondary | ICD-10-CM | POA: Diagnosis not present

## 2021-10-16 DIAGNOSIS — C3490 Malignant neoplasm of unspecified part of unspecified bronchus or lung: Secondary | ICD-10-CM | POA: Diagnosis not present

## 2021-10-16 DIAGNOSIS — Z20822 Contact with and (suspected) exposure to covid-19: Secondary | ICD-10-CM | POA: Diagnosis not present

## 2021-10-16 DIAGNOSIS — Z01818 Encounter for other preprocedural examination: Secondary | ICD-10-CM | POA: Insufficient documentation

## 2021-10-16 HISTORY — DX: Chronic obstructive pulmonary disease, unspecified: J44.9

## 2021-10-16 HISTORY — DX: Unspecified osteoarthritis, unspecified site: M19.90

## 2021-10-16 LAB — COMPREHENSIVE METABOLIC PANEL
ALT: 17 U/L (ref 0–44)
AST: 18 U/L (ref 15–41)
Albumin: 3.9 g/dL (ref 3.5–5.0)
Alkaline Phosphatase: 68 U/L (ref 38–126)
Anion gap: 6 (ref 5–15)
BUN: 8 mg/dL (ref 8–23)
CO2: 27 mmol/L (ref 22–32)
Calcium: 9.2 mg/dL (ref 8.9–10.3)
Chloride: 102 mmol/L (ref 98–111)
Creatinine, Ser: 1.04 mg/dL (ref 0.61–1.24)
GFR, Estimated: >60 mL/min (ref >60)
Glucose, Bld: 102 mg/dL — ABNORMAL HIGH (ref 70–99)
Potassium: 3.9 mmol/L (ref 3.5–5.1)
Sodium: 135 mmol/L (ref 135–145)
Total Bilirubin: 1.1 mg/dL (ref 0.3–1.2)
Total Protein: 6.5 g/dL (ref 6.5–8.1)

## 2021-10-16 LAB — PROTIME-INR
INR: 0.9 (ref 0.8–1.2)
Prothrombin Time: 12.2 seconds (ref 11.4–15.2)

## 2021-10-16 LAB — APTT: aPTT: 26 seconds (ref 24–36)

## 2021-10-16 LAB — CBC
HCT: 47.4 % (ref 39.0–52.0)
Hemoglobin: 16.5 g/dL (ref 13.0–17.0)
MCH: 33.9 pg (ref 26.0–34.0)
MCHC: 34.8 g/dL (ref 30.0–36.0)
MCV: 97.3 fL (ref 80.0–100.0)
Platelets: 255 10*3/uL (ref 150–400)
RBC: 4.87 MIL/uL (ref 4.22–5.81)
RDW: 13.1 % (ref 11.5–15.5)
WBC: 9.9 10*3/uL (ref 4.0–10.5)
nRBC: 0 % (ref 0.0–0.2)

## 2021-10-16 LAB — TYPE AND SCREEN
ABO/RH(D): A POS
Antibody Screen: NEGATIVE

## 2021-10-16 LAB — SARS CORONAVIRUS 2 (TAT 6-24 HRS): SARS Coronavirus 2: NEGATIVE

## 2021-10-16 NOTE — Pre-Procedure Instructions (Signed)
? ? DUELL [facility]OLDREN ? 10/16/2021  ?  ? Your procedure is scheduled on Monday April 24. . ? Report to [facility] Admitting at 9:30 AM  ? Call this number if you have problems the morning of surgery: ? [phone] this is the pre op desk. ? ? Remember: ? Do not eat or drink after midnight, Sunday, April 23. ?  ? Take these medicines the morning of surgery with A SIP OF WATER : ?Take if needed: ?acetaminophen (TYLENOL) ?albuterol (VENTOLIN HFA) inhaler- please bring it with you ?predniSONE (DELTASONE)  ? ?STOP taking Aspirin, Aspirin Products (Goody Powder, Excedrin Migraine), Ibuprofen (Advil), Naproxen (Aleve), Vitamins and Herbal Products (ie Fish Oil) and fexofenadine-pseudoephedrine (ALLEGRA-D), you may take plain Allegra ? ?[facility] - Preparing for Surgery ? ?Before surgery, you can play an important role.  Because skin is not sterile, your skin needs to be as free of germs as possible.  You can reduce the number of germs on you skin by washing with CHG (chlorahexidine gluconate) soap before surgery.  CHG is an antiseptic cleaner which kills germs and bonds with the skin to continue killing germs even after washing.  Oral Hygiene is also important in reducing the risk of infection.  Remember to brush your teeth with your regular toothpaste the morning of surgery. ? ?Please DO NOT use if you have an allergy to CHG or antibacterial soaps.  If your skin becomes reddened/irritated stop using the CHG and inform your nurse when you arrive at Short Stay. ? ?Do not shave (including legs and underarms) for at least 48 hours prior to the first CHG shower.  You may shave your face. ? ?Please follow these instructions carefully: ? ? 1.  Shower with CHG Soap the night before surgery and the morning of Surgery. ? 2.  If you choose to wash your hair, wash your hair first as usual with your normal shampoo. ? 3.  After you shampoo, rinse your hair and body thoroughly to remove the shampoo. ?4.  Use CHG as you would  any other liquid soap.  You can apply chg directly to the skin and wash gently with a ?     scrungie or washcloth.           ?5.  Apply the CHG Soap to your body ONLY FROM THE NECK DOWN.   Do not use on open wounds or open sores. Avoid contact with your eyes, ears, mouth and genitals (private parts).  Wash genitals (private parts) with your normal soap. ? 6.  Wash thoroughly, paying special attention to the area where your surgery will be performed. ? 7.  Thoroughly rinse your body with warm water from the neck down. ? 8.  DO NOT shower/wash with your normal soap after using and rinsing off the CHG Soap. ? 9.  Pat yourself dry with a clean towel. ?           10.  Wear clean pajamas. ?           11 .  Place clean sheets on your bed the night of your first shower and do not sleep with pet . ? ?Day of Surgery ?Shower as instructed above ?Do not apply any lotions/deoderants the morning of surgery.   ?Please wear clean clothes to the hospital/surgery center. ?Remember to brush your teeth with toothpaste. ? Do not wear jewelry, make-up or nail polish. ? Do not wear lotions, powders, or perfumes, or deodorant. ?  Men may shave face and neck. ? Do not bring valuables to the hospital. ? [facility] is not responsible for any belongings or valuables. ? ?Contacts, dentures or bridgework may not be worn into surgery.  Leave your suitcase in the car.  After surgery it may be brought to your room. ? ?For patients admitted to the hospital, discharge time will be determined by your treatment team. ? ?Patients discharged the day of surgery will not be allowed to drive home.  ? ? ?Please read over the  fact sheets that you were given. ? ?  ? ? ?  ?

## 2021-10-16 NOTE — Progress Notes (Signed)
PCP - Dr. [name] ? ?Cardiologist - non ? ?EP-none ? ?Endocrine-na ? ?Pulm- ? ?Chest x-ray - 10/16/21 ? ?EKG - 10/16/21 ? ?Stress Test -  ? ?ECHO -  ? ?Cardiac Cath -  ? ?Sleep Study - no ?CPAP - no ? ?LABS-CBC, CMP. PT, PTT, Covid ? ?ASA-none ? ?ERAS-no ? ?HA1C-na ?Fasting Blood Sugar - na ?Checks Blood Sugar _na____ times a day ? ?Anesthesia- ? ?Pt denies having chest pain, sob, or fever at this time. All instructions explained to the pt, with a verbal understanding of the material. Pt agrees to go over the instructions while at home for a better understanding. Pt also instructed to self quarantine after being tested for COVID-19. The opportunity to ask questions was provided.  ?

## 2021-10-20 ENCOUNTER — Encounter (HOSPITAL_COMMUNITY): Payer: Self-pay | Admitting: Thoracic Surgery (Cardiothoracic Vascular Surgery)

## 2021-10-20 ENCOUNTER — Ambulatory Visit (HOSPITAL_BASED_OUTPATIENT_CLINIC_OR_DEPARTMENT_OTHER): Payer: PPO

## 2021-10-20 ENCOUNTER — Ambulatory Visit (HOSPITAL_COMMUNITY)
Admission: RE | Admit: 2021-10-20 | Discharge: 2021-10-20 | Disposition: A | Discharge status: Home / Self Care | Payer: PPO | Attending: Thoracic Surgery (Cardiothoracic Vascular Surgery) | Admitting: Thoracic Surgery (Cardiothoracic Vascular Surgery)

## 2021-10-20 ENCOUNTER — Other Ambulatory Visit: Payer: Self-pay

## 2021-10-20 ENCOUNTER — Encounter (HOSPITAL_COMMUNITY)
Admission: RE | Disposition: A | Discharge status: Home / Self Care | Payer: Self-pay | Source: Home / Self Care | Attending: Thoracic Surgery (Cardiothoracic Vascular Surgery)

## 2021-10-20 ENCOUNTER — Ambulatory Visit (HOSPITAL_COMMUNITY): Payer: PPO

## 2021-10-20 DIAGNOSIS — J439 Emphysema, unspecified: Secondary | ICD-10-CM | POA: Insufficient documentation

## 2021-10-20 DIAGNOSIS — C3402 Malignant neoplasm of left main bronchus: Secondary | ICD-10-CM | POA: Diagnosis not present

## 2021-10-20 DIAGNOSIS — Z122 Encounter for screening for malignant neoplasm of respiratory organs: Secondary | ICD-10-CM

## 2021-10-20 DIAGNOSIS — M1990 Unspecified osteoarthritis, unspecified site: Secondary | ICD-10-CM

## 2021-10-20 DIAGNOSIS — I700 Atherosclerosis of aorta: Secondary | ICD-10-CM | POA: Insufficient documentation

## 2021-10-20 DIAGNOSIS — R590 Localized enlarged lymph nodes: Secondary | ICD-10-CM | POA: Insufficient documentation

## 2021-10-20 DIAGNOSIS — C801 Malignant (primary) neoplasm, unspecified: Secondary | ICD-10-CM | POA: Diagnosis not present

## 2021-10-20 DIAGNOSIS — R911 Solitary pulmonary nodule: Secondary | ICD-10-CM

## 2021-10-20 DIAGNOSIS — J449 Chronic obstructive pulmonary disease, unspecified: Secondary | ICD-10-CM | POA: Diagnosis not present

## 2021-10-20 DIAGNOSIS — Z87891 Personal history of nicotine dependence: Secondary | ICD-10-CM | POA: Insufficient documentation

## 2021-10-20 DIAGNOSIS — C771 Secondary and unspecified malignant neoplasm of intrathoracic lymph nodes: Secondary | ICD-10-CM | POA: Insufficient documentation

## 2021-10-20 HISTORY — PX: MEDIASTINOSCOPY: SHX5086

## 2021-10-20 HISTORY — PX: VIDEO BRONCHOSCOPY WITH ENDOBRONCHIAL ULTRASOUND: SHX6177

## 2021-10-20 LAB — ABO/RH: ABO/RH(D): A POS

## 2021-10-20 SURGERY — BRONCHOSCOPY, WITH EBUS
Anesthesia: General | Site: Chest

## 2021-10-20 MED ORDER — DEXAMETHASONE SODIUM PHOSPHATE 10 MG/ML IJ SOLN
INTRAMUSCULAR | Status: DC | PRN
Start: 2021-10-20 — End: 2021-10-20
  Administered 2021-10-20: 10 mg via INTRAVENOUS

## 2021-10-20 MED ORDER — HYDROMORPHONE HCL 1 MG/ML IJ SOLN
INTRAMUSCULAR | Status: AC
  Filled 2021-10-20: qty 1

## 2021-10-20 MED ORDER — SUGAMMADEX SODIUM 200 MG/2ML IV SOLN
INTRAVENOUS | Status: DC | PRN
  Administered 2021-10-20: 200 mg via INTRAVENOUS

## 2021-10-20 MED ORDER — HYDROMORPHONE HCL 1 MG/ML IJ SOLN
0.2500 mg | INTRAMUSCULAR | Status: DC | PRN
  Administered 2021-10-20: 0.5 mg via INTRAVENOUS

## 2021-10-20 MED ORDER — PROPOFOL 10 MG/ML IV BOLUS
INTRAVENOUS | Status: DC | PRN
Start: 2021-10-20 — End: 2021-10-20
  Administered 2021-10-20: 130 mg via INTRAVENOUS

## 2021-10-20 MED ORDER — HYDROMORPHONE HCL 1 MG/ML IJ SOLN
INTRAMUSCULAR | Status: DC | PRN
  Administered 2021-10-20: .5 mg via INTRAVENOUS

## 2021-10-20 MED ORDER — PHENYLEPHRINE 80 MCG/ML (10ML) SYRINGE FOR IV PUSH (FOR BLOOD PRESSURE SUPPORT)
PREFILLED_SYRINGE | INTRAVENOUS | Status: AC
  Filled 2021-10-20: qty 10

## 2021-10-20 MED ORDER — MIDAZOLAM HCL 2 MG/2ML IJ SOLN
INTRAMUSCULAR | Status: AC
  Filled 2021-10-20: qty 2

## 2021-10-20 MED ORDER — PHENYLEPHRINE 80 MCG/ML (10ML) SYRINGE FOR IV PUSH (FOR BLOOD PRESSURE SUPPORT)
PREFILLED_SYRINGE | INTRAVENOUS | Status: DC | PRN
  Administered 2021-10-20 (×2): 80 ug via INTRAVENOUS
  Administered 2021-10-20: 160 ug via INTRAVENOUS

## 2021-10-20 MED ORDER — OXYCODONE HCL 5 MG PO TABS: 5.0000 mg | ORAL_TABLET | Freq: Four times a day (QID) | ORAL | 0 refills | Status: DC | PRN

## 2021-10-20 MED ORDER — CHLORHEXIDINE GLUCONATE 0.12 % MT SOLN
OROMUCOSAL | Status: AC
  Administered 2021-10-20: 15 mL via OROMUCOSAL
  Filled 2021-10-20: qty 15

## 2021-10-20 MED ORDER — ROCURONIUM BROMIDE 10 MG/ML (PF) SYRINGE
PREFILLED_SYRINGE | INTRAVENOUS | Status: DC | PRN
  Administered 2021-10-20 (×2): 40 mg via INTRAVENOUS
  Administered 2021-10-20: 20 mg via INTRAVENOUS

## 2021-10-20 MED ORDER — VANCOMYCIN HCL IN DEXTROSE 1-5 GM/200ML-% IV SOLN
INTRAVENOUS | Status: AC
  Administered 2021-10-20: 1000 mg via INTRAVENOUS
  Filled 2021-10-20: qty 200

## 2021-10-20 MED ORDER — HEMOSTATIC AGENTS (NO CHARGE) OPTIME
TOPICAL | Status: DC | PRN
  Administered 2021-10-20: 1 via TOPICAL

## 2021-10-20 MED ORDER — ONDANSETRON HCL 4 MG/2ML IJ SOLN
INTRAMUSCULAR | Status: DC | PRN
  Administered 2021-10-20: 4 mg via INTRAVENOUS

## 2021-10-20 MED ORDER — EPINEPHRINE PF 1 MG/ML IJ SOLN
INTRAMUSCULAR | Status: AC
  Filled 2021-10-20: qty 1

## 2021-10-20 MED ORDER — ORAL CARE MOUTH RINSE: 15.0000 mL | Freq: Once | OROMUCOSAL | Status: AC

## 2021-10-20 MED ORDER — AMISULPRIDE (ANTIEMETIC) 5 MG/2ML IV SOLN: 10.0000 mg | Freq: Once | INTRAVENOUS | Status: DC | PRN

## 2021-10-20 MED ORDER — ONDANSETRON HCL 4 MG/2ML IJ SOLN
INTRAMUSCULAR | Status: AC
  Filled 2021-10-20: qty 2

## 2021-10-20 MED ORDER — MIDAZOLAM HCL 2 MG/2ML IJ SOLN
INTRAMUSCULAR | Status: DC | PRN
  Administered 2021-10-20: 2 mg via INTRAVENOUS

## 2021-10-20 MED ORDER — 0.9 % SODIUM CHLORIDE (POUR BTL) OPTIME
TOPICAL | Status: DC | PRN
Start: 2021-10-20 — End: 2021-10-20
  Administered 2021-10-20 (×2): 1000 mL

## 2021-10-20 MED ORDER — LACTATED RINGERS IV SOLN: INTRAVENOUS | Status: DC

## 2021-10-20 MED ORDER — FENTANYL CITRATE (PF) 250 MCG/5ML IJ SOLN
INTRAMUSCULAR | Status: AC
  Filled 2021-10-20: qty 5

## 2021-10-20 MED ORDER — PROMETHAZINE HCL 25 MG/ML IJ SOLN: 6.2500 mg | INTRAMUSCULAR | Status: DC | PRN

## 2021-10-20 MED ORDER — OXYCODONE HCL 5 MG/5ML PO SOLN: 5.0000 mg | Freq: Once | ORAL | Status: DC | PRN

## 2021-10-20 MED ORDER — FENTANYL CITRATE (PF) 250 MCG/5ML IJ SOLN
INTRAMUSCULAR | Status: DC | PRN
  Administered 2021-10-20 (×2): 100 ug via INTRAVENOUS
  Administered 2021-10-20: 50 ug via INTRAVENOUS

## 2021-10-20 MED ORDER — LIDOCAINE 2% (20 MG/ML) 5 ML SYRINGE
INTRAMUSCULAR | Status: AC
  Filled 2021-10-20: qty 5

## 2021-10-20 MED ORDER — CHLORHEXIDINE GLUCONATE 0.12 % MT SOLN: 15.0000 mL | Freq: Once | OROMUCOSAL | Status: AC

## 2021-10-20 MED ORDER — PROPOFOL 10 MG/ML IV BOLUS
INTRAVENOUS | Status: AC
  Filled 2021-10-20: qty 20

## 2021-10-20 MED ORDER — OXYCODONE HCL 5 MG PO TABS: 5.0000 mg | ORAL_TABLET | Freq: Once | ORAL | Status: DC | PRN

## 2021-10-20 MED ORDER — LIDOCAINE 2% (20 MG/ML) 5 ML SYRINGE
INTRAMUSCULAR | Status: DC | PRN
  Administered 2021-10-20: 60 mg via INTRAVENOUS

## 2021-10-20 MED ORDER — HYDROMORPHONE HCL 1 MG/ML IJ SOLN
INTRAMUSCULAR | Status: AC
  Filled 2021-10-20: qty 0.5

## 2021-10-20 MED ORDER — MEPERIDINE HCL 25 MG/ML IJ SOLN: 6.2500 mg | INTRAMUSCULAR | Status: DC | PRN

## 2021-10-20 MED ORDER — VANCOMYCIN HCL IN DEXTROSE 1-5 GM/200ML-% IV SOLN: 1000.0000 mg | INTRAVENOUS | Status: AC

## 2021-10-20 MED ORDER — DEXAMETHASONE SODIUM PHOSPHATE 10 MG/ML IJ SOLN
INTRAMUSCULAR | Status: AC
  Filled 2021-10-20: qty 1

## 2021-10-20 MED ORDER — ROCURONIUM BROMIDE 10 MG/ML (PF) SYRINGE
PREFILLED_SYRINGE | INTRAVENOUS | Status: AC
  Filled 2021-10-20: qty 10

## 2021-10-20 SURGICAL SUPPLY — 70 items
ADAPTER VALVE BIOPSY EBUS (MISCELLANEOUS) IMPLANT
ADPTR VALVE BIOPSY EBUS (MISCELLANEOUS)
APPLIER CLIP LOGIC TI 5 (MISCELLANEOUS) IMPLANT
BLADE CLIPPER SURG (BLADE) ×3 IMPLANT
BLADE SURG 15 STRL LF DISP TIS (BLADE) ×2 IMPLANT
BLADE SURG 15 STRL SS (BLADE) ×3
BRUSH CYTOL CELLEBRITY 1.5X140 (MISCELLANEOUS) IMPLANT
CANISTER SUCT 3000ML PPV (MISCELLANEOUS) ×6 IMPLANT
CLIP TI MEDIUM 6 (CLIP) ×1 IMPLANT
CNTNR URN SCR LID CUP LEK RST (MISCELLANEOUS) ×6 IMPLANT
CONT SPEC 4OZ STRL OR WHT (MISCELLANEOUS) ×18
COVER BACK TABLE 60X90IN (DRAPES) ×3 IMPLANT
COVER SURGICAL LIGHT HANDLE (MISCELLANEOUS) ×6 IMPLANT
DERMABOND ADVANCED (GAUZE/BANDAGES/DRESSINGS) ×1
DERMABOND ADVANCED .7 DNX12 (GAUZE/BANDAGES/DRESSINGS) ×2 IMPLANT
DRAPE CHEST BREAST 15X10 FENES (DRAPES) ×3 IMPLANT
ELECT REM PT RETURN 9FT ADLT (ELECTROSURGICAL) ×3
ELECTRODE REM PT RTRN 9FT ADLT (ELECTROSURGICAL) ×2 IMPLANT
FILTER STRAW FLUID ASPIR (MISCELLANEOUS) IMPLANT
FORCEPS BIOP RJ4 1.8 (CUTTING FORCEPS) IMPLANT
FORCEPS RADIAL JAW LRG 4 PULM (INSTRUMENTS) IMPLANT
GAUZE SPONGE 4X4 12PLY STRL (GAUZE/BANDAGES/DRESSINGS) IMPLANT
GLOVE SURG MICRO LTX SZ7.5 (GLOVE) ×3 IMPLANT
GLOVE SURG SIGNA 7.5 PF LTX (GLOVE) ×3 IMPLANT
GOWN STRL REUS W/ TWL LRG LVL3 (GOWN DISPOSABLE) ×2 IMPLANT
GOWN STRL REUS W/ TWL XL LVL3 (GOWN DISPOSABLE) ×4 IMPLANT
GOWN STRL REUS W/TWL LRG LVL3 (GOWN DISPOSABLE) ×3
GOWN STRL REUS W/TWL XL LVL3 (GOWN DISPOSABLE) ×6
HEMOSTAT SURGICEL 2X14 (HEMOSTASIS) ×1 IMPLANT
KIT BASIN OR (CUSTOM PROCEDURE TRAY) ×3 IMPLANT
KIT CLEAN ENDO COMPLIANCE (KITS) ×6 IMPLANT
KIT TURNOVER KIT B (KITS) ×6 IMPLANT
MARKER SKIN DUAL TIP RULER LAB (MISCELLANEOUS) ×3 IMPLANT
NDL ASPIRATION VIZISHOT 19G (NEEDLE) IMPLANT
NDL ASPIRATION VIZISHOT 21G (NEEDLE) ×2 IMPLANT
NDL BLUNT 18X1 FOR OR ONLY (NEEDLE) IMPLANT
NEEDLE ASPIRATION VIZISHOT 19G (NEEDLE) ×3 IMPLANT
NEEDLE ASPIRATION VIZISHOT 21G (NEEDLE) ×3 IMPLANT
NEEDLE BLUNT 18X1 FOR OR ONLY (NEEDLE) IMPLANT
NS IRRIG 1000ML POUR BTL (IV SOLUTION) ×7 IMPLANT
OIL SILICONE PENTAX (PARTS (SERVICE/REPAIRS)) ×3 IMPLANT
PACK GENERAL/GYN (CUSTOM PROCEDURE TRAY) ×3 IMPLANT
PAD ARMBOARD 7.5X6 YLW CONV (MISCELLANEOUS) ×12 IMPLANT
PENCIL BUTTON HOLSTER BLD 10FT (ELECTRODE) ×3 IMPLANT
RADIAL JAW LRG 4 PULMONARY (INSTRUMENTS)
SPONGE INTESTINAL PEANUT (DISPOSABLE) ×1 IMPLANT
SPONGE T-LAP 18X18 ~~LOC~~+RFID (SPONGE) ×16 IMPLANT
SUT SILK 2 0 (SUTURE) ×3
SUT SILK 2-0 18XBRD TIE 12 (SUTURE) IMPLANT
SUT VIC AB 2-0 CT1 27 (SUTURE) ×3
SUT VIC AB 2-0 CT1 TAPERPNT 27 (SUTURE) IMPLANT
SUT VIC AB 3-0 SH 27 (SUTURE) ×6
SUT VIC AB 3-0 SH 27XBRD (SUTURE) ×2 IMPLANT
SUT VIC AB 4-0 PS2 18 (SUTURE) ×2 IMPLANT
SUT VICRYL 4-0 PS2 18IN ABS (SUTURE) ×2 IMPLANT
SYR 10ML LL (SYRINGE) ×3 IMPLANT
SYR 20ML ECCENTRIC (SYRINGE) ×3 IMPLANT
SYR 20ML LL LF (SYRINGE) ×3 IMPLANT
SYR 3ML LL SCALE MARK (SYRINGE) IMPLANT
SYR 5ML LL (SYRINGE) ×3 IMPLANT
SYR 5ML LUER SLIP (SYRINGE) ×4 IMPLANT
TOWEL GREEN STERILE (TOWEL DISPOSABLE) ×6 IMPLANT
TOWEL GREEN STERILE FF (TOWEL DISPOSABLE) ×6 IMPLANT
TRAP SPECIMEN MUCUS 40CC (MISCELLANEOUS) ×3 IMPLANT
TUBE CONNECTING 12X1/4 (SUCTIONS) ×1 IMPLANT
TUBE CONNECTING 20X1/4 (TUBING) ×3 IMPLANT
VALVE BIOPSY  SINGLE USE (MISCELLANEOUS) ×3
VALVE BIOPSY SINGLE USE (MISCELLANEOUS) ×2 IMPLANT
VALVE SUCTION BRONCHIO DISP (MISCELLANEOUS) ×3 IMPLANT
WATER STERILE IRR 1000ML POUR (IV SOLUTION) ×6 IMPLANT

## 2021-10-20 NOTE — Brief Op Note (Signed)
10/20/2021 ? ?3:50 PM ? ?PATIENT:  Jonathan Castro  66 y.o. male ? ?PRE-OPERATIVE DIAGNOSIS:  LEFT HILAR AND MEDIASTINAL ADENOPATHY ? ?POST-OPERATIVE DIAGNOSIS:  LEFT HILAR AND MEDIASTINAL ADENOPATHY, PROBABLE SMALL CELL LUNG CANCER ? ?PROCEDURE:  Procedure(s): ?VIDEO BRONCHOSCOPY WITH ENDOBRONCHIAL ULTRASOUND WITH BIOPSIES (N/A) ?MEDIASTINOSCOPY (N/A) ? ?SURGEON:  Surgeon(s) and Role: ?   * Melrose Nakayama, MD - Primary ? ?PHYSICIAN ASSISTANT:  ? ?ASSISTANTS: none  ? ?ANESTHESIA:   general ? ?EBL:  50 mL  ? ?BLOOD ADMINISTERED:none ? ?DRAINS: none  ? ?LOCAL MEDICATIONS USED:  NONE ? ?SPECIMEN:  Source of Specimen:  4L lymph nodes ? ?DISPOSITION OF SPECIMEN:  PATHOLOGY ? ?COUNTS:  YES ? ?TOURNIQUET:  * No tourniquets in log * ? ?DICTATION: .Other Dictation: Dictation Number - ? ?PLAN OF CARE: Discharge to home after PACU ? ?PATIENT DISPOSITION:  PACU - hemodynamically stable. ?  ?Delay start of Pharmacological VTE agent (>24hrs) due to surgical blood loss or risk of bleeding: not applicable ? ?

## 2021-10-20 NOTE — Anesthesia Preprocedure Evaluation (Signed)
Anesthesia Evaluation  ?Patient identified by MRN, date of birth, ID band ?Patient awake ? ? ? ?Reviewed: ?Allergy & Precautions, NPO status , Patient's Chart, lab work & pertinent test results ? ?Airway ?Mallampati: II ? ?TM Distance: >3 FB ?Neck ROM: Full ? ? ? Dental ?no notable dental hx. ? ?  ?Pulmonary ?COPD, Patient abstained from smoking., former smoker,  ?  ?Pulmonary exam normal ?breath sounds clear to auscultation ? ? ? ? ? ? Cardiovascular ?negative cardio ROS ?Normal cardiovascular exam ?Rhythm:Regular Rate:Normal ? ? ?  ?Neuro/Psych ?negative neurological ROS ? negative psych ROS  ? GI/Hepatic ?Neg liver ROS, GERD  ,  ?Endo/Other  ?negative endocrine ROS ? Renal/GU ?negative Renal ROS  ?negative genitourinary ?  ?Musculoskeletal ? ?(+) Arthritis , Osteoarthritis,   ? Abdominal ?  ?Peds ?negative pediatric ROS ?(+)  Hematology ?negative hematology ROS ?(+)   ?Anesthesia Other Findings ? ? Reproductive/Obstetrics ?negative OB ROS ? ?  ? ? ? ? ? ? ? ? ? ? ? ? ? ?  ?  ? ? ? ? ? ? ? ? ?Anesthesia Physical ?Anesthesia Plan ? ?ASA: 3 ? ?Anesthesia Plan: General  ? ?Post-op Pain Management:   ? ?Induction: Intravenous ? ?PONV Risk Score and Plan: 2 and Ondansetron, Midazolam and Treatment may vary due to age or medical condition ? ?Airway Management Planned: Oral ETT ? ?Additional Equipment:  ? ?Intra-op Plan:  ? ?Post-operative Plan: Extubation in OR ? ?Informed Consent: I have reviewed the patients History and Physical, chart, labs and discussed the procedure including the risks, benefits and alternatives for the proposed anesthesia with the patient or authorized representative who has indicated his/her understanding and acceptance.  ? ? ? ?Dental advisory given ? ?Plan Discussed with: CRNA ? ?Anesthesia Plan Comments:   ? ? ? ? ? ? ?Anesthesia Quick Evaluation ? ?

## 2021-10-20 NOTE — Anesthesia Procedure Notes (Signed)
Procedure Name: Intubation ?Date/Time: 10/20/2021 1:34 PM ?Performed by: Lance Coon, CRNA ?Pre-anesthesia Checklist: Patient identified, Emergency Drugs available, Suction available, Patient being monitored and Timeout performed ?Patient Re-evaluated:Patient Re-evaluated prior to induction ?Oxygen Delivery Method: Circle system utilized ?Preoxygenation: Pre-oxygenation with 100% oxygen ?Induction Type: IV induction ?Ventilation: Mask ventilation without difficulty and Oral airway inserted - appropriate to patient size ?Laryngoscope Size: Sabra Heck and 3 ?Grade View: Grade II ?Tube type: Oral ?Tube size: 7.5 mm ?Number of attempts: 1 ?Airway Equipment and Method: Stylet ?Placement Confirmation: ETT inserted through vocal cords under direct vision, positive ETCO2 and breath sounds checked- equal and bilateral ?Secured at: 21 cm ?Tube secured with: Tape ?Dental Injury: Teeth and Oropharynx as per pre-operative assessment  ? ? ? ? ?

## 2021-10-20 NOTE — Transfer of Care (Signed)
Immediate Anesthesia Transfer of Care Note ? ?Patient: Jonathan Castro ? ?Procedure(s) Performed: VIDEO BRONCHOSCOPY WITH ENDOBRONCHIAL ULTRASOUND WITH BIOPSIES (Chest) ?MEDIASTINOSCOPY ? ?Patient Location: PACU ? ?Anesthesia Type:General ? ?Level of Consciousness: drowsy and patient cooperative ? ?Airway & Oxygen Therapy: Patient Spontanous Breathing ? ?Post-op Assessment: Report given to RN and Post -op Vital signs reviewed and stable ? ?Post vital signs: Reviewed and stable ? ?Last Vitals:  ?Vitals Value Taken Time  ?BP    ?Temp    ?Pulse 85 10/20/21 1538  ?Resp 16 10/20/21 1538  ?SpO2 97 % 10/20/21 1538  ?Vitals shown include unvalidated device data. ? ?Last Pain:  ?Vitals:  ? 10/20/21 0952  ?TempSrc:   ?PainSc: 0-No pain  ?   ? ?  ? ?Complications: No notable events documented. ?

## 2021-10-20 NOTE — Interval H&P Note (Signed)
History and Physical Interval Note: ? ?10/20/2021 ?12:38 PM ? ?[name]  has presented today for surgery, with the diagnosis of LEFT HILAR AND MEDIASTINAL ADENOPATHY.  The various methods of treatment have been discussed with the patient and family. After consideration of risks, benefits and other options for treatment, the patient has consented to  Procedure(s): ?VIDEO BRONCHOSCOPY WITH ENDOBRONCHIAL ULTRASOUND (N/A) ?possible MEDIASTINOSCOPY (N/A) as a surgical intervention.  The patient's history has been reviewed, patient examined, no change in status, stable for surgery.  I have reviewed the patient's chart and labs.  Questions were answered to the patient's satisfaction.   ? ? ?Jonathan Castro ? ? ?

## 2021-10-20 NOTE — Progress Notes (Signed)
Wasted 0.5mg  Dilaudid with Jerrye Beavers RN ?

## 2021-10-20 NOTE — Discharge Instructions (Addendum)
Do not drive for 3 day. After that you may begin driving as long as you aren't requiring any narcotic pain medication. ? ?You may shower Wednesday. Wait until after your follow up to play golf. ? ?You have a prescription for oxycodone, a narcotic pain reliever. You may use as directed. You may use acetaminophen (Tylenol) or ibuprofen (Advil, Motrin) in addition to or instead of the oxycodone ? ?You may cough up small amounts of blood over the next day or two. ? ?Call [phone] if you develop chest pain, shortness of breath, fever > 101 F or notice excessive swelling, redness, tenderness or drainage from the incision. ? ?My office will arrange a follow up appointment for you next week. ? ? ?

## 2021-10-21 ENCOUNTER — Encounter: Payer: Self-pay | Admitting: *Deleted

## 2021-10-21 ENCOUNTER — Encounter (HOSPITAL_COMMUNITY): Payer: Self-pay | Admitting: Thoracic Surgery (Cardiothoracic Vascular Surgery)

## 2021-10-21 ENCOUNTER — Telehealth: Payer: Self-pay | Admitting: Internal Medicine

## 2021-10-21 DIAGNOSIS — R918 Other nonspecific abnormal finding of lung field: Secondary | ICD-10-CM

## 2021-10-21 NOTE — Telephone Encounter (Signed)
Scheduled appt per 4/25 staff msg from [employer]. Pt is aware of appt date and time. Pt is aware to arrive 15 mins prior to appt time and to bring and updated insurance card. Pt is aware of appt location.   ?

## 2021-10-21 NOTE — Op Note (Signed)
NAME: Jonathan Castro, Jonathan D. ?MEDICAL RECORD NO: [id number] ?ACCOUNT NO: [id number] ?DATE OF BIRTH: 10-02-1955 ?FACILITY: [facility] ?LOCATION: [facility] ?PHYSICIAN: Revonda Standard. Roxan Hockey, MD ? ?Operative Report  ? ?DATE OF PROCEDURE: 10/20/2021 ? ?PREOPERATIVE DIAGNOSIS:  Left hilar and mediastinal adenopathy. ? ?POSTOPERATIVE DIAGNOSIS:  Left hilar and mediastinal adenopathy.  Probable small cell carcinoma. ? ?PROCEDURE:  Bronchoscopy, endobronchial ultrasound with needle aspirations and mediastinoscopy. ? ?SURGEON:  Revonda Standard. Roxan Hockey, MD ? ?ASSISTANT:  None. ? ?ANESTHESIA:  General. ? ?FINDINGS:  Quick prep on needle aspirations, positive for malignancy, but unable to further characterize.  Frozen section of 4L node showed probable small cell carcinoma. ? ?CLINICAL NOTE:  Jonathan Castro is a 66 year old gentleman with a history of tobacco abuse, who had a low dose screening CT for lung cancer and was found to have AP window and paratracheal adenopathy.  PET/CT showed these areas were markedly hypermetabolic.  ? He was advised to undergo bronchoscopy, endobronchial ultrasound and possible mediastinoscopy for diagnostic and staging purposes.  The indications, risks, benefits, and alternatives were discussed in detail with the patient.  He understood and accepted ? the risks and agreed to proceed. ? ?DESCRIPTION OF PROCEDURE:  Jonathan Castro was brought to the operating room on 10/20/2021.  He had induction of general anesthesia and was intubated.  Intravenous antibiotics were administered.  Sequential compression devices were placed on the calves for DVT  ?prophylaxis and a Bair Hugger was placed for active warming.  A timeout was performed.  Flexible fiberoptic bronchoscopy was performed via the endotracheal tube.  It revealed normal endobronchial anatomy with no endobronchial lesions to the level of the  ?subsegmental bronchi.  The bronchoscope was removed.  The endobronchial ultrasound probe then was advanced.  The left paratracheal  region was inspected and a large node was identified there, 3 needle aspirations were performed.  The first was with a  ?19-gauge needle and then the remainder with a 21-gauge needle. Th needle was advanced into the node and sampling was obtained.  15 passes were made with the needle with realtime ultrasound visualization for each aspiration.  Passes were done both with and without suction  ?applied.  The first quick prep showed lymphoid cells.  The second quick prep showed atypical cells and the third prep showed malignant cells with a differential being small cell carcinoma versus lymphoma, but relatively sparse cellularity.  Decision was  ?made to proceed with the mediastinoscopy in order to ensure we had a definitive diagnosis. ? ?The neck and chest were prepped and draped in the usual sterile fashion.  A second timeout was performed.  A transverse incision was made one fingerbreadth above the sternal notch.  It was carried through the skin and subcutaneous tissue.  The strap  ?muscles were separated in the midline.  The pretracheal fascia was identified and incised and the pretracheal plane was developed bluntly into the mediastinum.  The mediastinoscope was inserted.  Inspection of the 4L location showed 2 small, but  ?abnormal-appearing lymph nodes.  These were biopsied.  The first was sent for frozen section.  The second was sent for permanent only.  These nodes were similar in appearance.  Minimal cautery was used.  Surgicel was packed into the base of the wound and ? the wound was packed with gauze.  After 10 minutes, the gauze packing and Surgicel were removed and there was no significant bleeding.  The frozen section returned showing relatively normal-appearing lymph node with no malignancy was seen.  The  ?mediastinoscope was  reinserted.  A third 4L node was identified.  This was a slightly enlarged node, but was not the main nodal mass.  Biopsies were obtained of it.  The main nodal mass was  identified.  The cautery was used to expose this and multiple  ?biopsies were obtained.  This was a markedly abnormal, grossly malignant lymph node. Frozen section was sent of this and returned showing probable small cell carcinoma, but cannot rule out high-grade lymphoma.  Again, after doing the biopsies hemostasis  ?was achieved with cautery.  The wound was packed with Surgicel and then the wound was packed with gauze.  After 10 minutes, the packing was removed.  Surgicel was removed.  There was no ongoing bleeding.  A small piece of Surgicel was placed over the  ?node.  The mediastinoscope was removed.  The wound was closed in 2 layers.  Dermabond was applied.  The patient was extubated in the operating room and taken to the postanesthetic care unit in good condition.  All sponge, needle and instrument counts  ?were correct at the end of the procedure. ? ? ?PUS ?D: 10/20/2021 6:13:06 pm T: 10/21/2021 2:02:00 am  ?JOB: [id number]/ [id number]  ?

## 2021-10-21 NOTE — Anesthesia Postprocedure Evaluation (Signed)
Anesthesia Post Note ? ?Patient: Jonathan Castro ? ?Procedure(s) Performed: VIDEO BRONCHOSCOPY WITH ENDOBRONCHIAL ULTRASOUND WITH BIOPSIES (Chest) ?MEDIASTINOSCOPY ? ?  ? ?Patient location during evaluation: PACU ?Anesthesia Type: General ?Level of consciousness: awake and alert ?Pain management: pain level controlled ?Vital Signs Assessment: post-procedure vital signs reviewed and stable ?Respiratory status: spontaneous breathing, nonlabored ventilation, respiratory function stable and patient connected to nasal cannula oxygen ?Cardiovascular status: blood pressure returned to baseline and stable ?Postop Assessment: no apparent nausea or vomiting ?Anesthetic complications: no ? ? ?No notable events documented. ? ?Last Vitals:  ?Vitals:  ? 10/20/21 1615 10/20/21 1630  ?BP: 133/67 137/61  ?Pulse: 76 72  ?Resp: 17 (!) 21  ?Temp:  36.6 ?C  ?SpO2: 95% 95%  ?  ?Last Pain:  ?Vitals:  ? 10/20/21 1630  ?TempSrc:   ?PainSc: 3   ? ? ?  ?  ?  ?  ?  ?  ? ?Suzette Battiest E ? ? ? ? ?

## 2021-10-23 DIAGNOSIS — M1991 Primary osteoarthritis, unspecified site: Secondary | ICD-10-CM | POA: Diagnosis not present

## 2021-10-23 DIAGNOSIS — M5030 Other cervical disc degeneration, unspecified cervical region: Secondary | ICD-10-CM | POA: Diagnosis not present

## 2021-10-23 DIAGNOSIS — M25511 Pain in right shoulder: Secondary | ICD-10-CM | POA: Diagnosis not present

## 2021-10-23 DIAGNOSIS — M0579 Rheumatoid arthritis with rheumatoid factor of multiple sites without organ or systems involvement: Secondary | ICD-10-CM | POA: Diagnosis not present

## 2021-10-23 DIAGNOSIS — K219 Gastro-esophageal reflux disease without esophagitis: Secondary | ICD-10-CM | POA: Diagnosis not present

## 2021-10-23 DIAGNOSIS — Z6824 Body mass index (BMI) 24.0-24.9, adult: Secondary | ICD-10-CM | POA: Diagnosis not present

## 2021-10-23 LAB — SURGICAL PATHOLOGY

## 2021-10-24 LAB — CYTOLOGY - NON PAP

## 2021-10-27 ENCOUNTER — Ambulatory Visit: Payer: PPO | Admitting: Thoracic Surgery (Cardiothoracic Vascular Surgery)

## 2021-10-27 ENCOUNTER — Other Ambulatory Visit: Payer: Self-pay | Admitting: Thoracic Surgery (Cardiothoracic Vascular Surgery)

## 2021-10-27 ENCOUNTER — Encounter: Payer: Self-pay | Admitting: Thoracic Surgery (Cardiothoracic Vascular Surgery)

## 2021-10-27 DIAGNOSIS — R911 Solitary pulmonary nodule: Secondary | ICD-10-CM

## 2021-10-27 NOTE — Progress Notes (Signed)
? ?   ?  [address] ?      [place] [address] ?            [phone]   ? ? ?HPI: Mr. Flinchum returns to discuss results of his mediastinoscopy. ? ?Jonathan Castro is a 66 year old with a history of smoking who recently had a low-dose screening CT for lung cancer.  It showed an left upper lobe mass and AP window and paratracheal adenopathy.  PET/CT showed that his areas were hypermetabolic. ? ?I did a bronchoscopy, endobronchial ultrasound, and mediastinoscopy on 10/20/2021.  Results showed small cell carcinoma. ? ?He is not having any significant pain but does complain of some hoarseness. ? ?Past Medical History:  ?Diagnosis Date  ? Arthritis   ? COPD (chronic obstructive pulmonary disease) ([place])   ? ? ?Current Outpatient Medications  ?Medication Sig Dispense Refill  ? acetaminophen (TYLENOL) 650 MG CR tablet Take 1,300 mg by mouth daily.    ? albuterol (VENTOLIN HFA) 108 (90 Base) MCG/ACT inhaler Inhale 2 puffs into the lungs every 6 (six) hours as needed for wheezing or shortness of breath.    ? fexofenadine-pseudoephedrine (ALLEGRA-D) 60-120 MG 12 hr tablet Take 1 tablet by mouth 2 (two) times daily as needed (allergies).    ? folic acid (FOLVITE) 1 MG tablet Take 1 mg by mouth daily.    ? methotrexate 50 MG/2ML injection Inject 15 mg into the vein every Wednesday.    ? oxyCODONE (ROXICODONE) 5 MG immediate release tablet Take 1-2 tablets (5-10 mg total) by mouth every 6 (six) hours as needed for severe pain. 25 tablet 0  ? predniSONE (DELTASONE) 5 MG tablet Take 5 mg by mouth daily as needed (arthritis flare).    ? ?No current facility-administered medications for this visit.  ? ? ?Physical Exam ?BP (!) 146/85 (BP Location: Left Arm, Patient Position: Sitting, Cuff Size: Normal)   Pulse 74   Resp 20   Ht 5\' 7"  (1.702 m)   Wt 158 lb (71.7 kg)   SpO2 99% Comment: RA  BMI 24.57 kg/m?  ?66 year old man in no acute distress ?Alert and oriented x3, hoarse voice ?Incision clean dry and  intact ? ?Diagnostic Tests: ?Biopsy showed small cell carcinoma ? ?Impression: ?Madsen Riddle is a 66 year old smoker who was found to have left upper lobe mass, AP window adenopathy and paratracheal adenopathy on a low-dose screening CT.  This findings were confirmed with PET/CT.  Biopsy showed small cell carcinoma. ? ?Needs an MRI of the brain to rule out metastasis there.  He has relatively limited disease. ? ?He has an appointment with Dr. Julien Nordmann on Thursday. ? ?He does have some hoarseness.  Likely from the biopsies of the left paratracheal lymph nodes.  Usually will recover in time.  He has an appointment with an ENT in late May.  He will discussed with them if still present at that time. ? ?Plan: ?MR brain to rule out mass ?Follow-up with Dr. Julien Nordmann on Thursday ?I will be happy to see Mr. Burkitt back anytime in the future and be of further assistance with his care ? ?Melrose Nakayama, MD ?[facility] ?([phone] ? ? ? ? ?

## 2021-10-29 DIAGNOSIS — M9901 Segmental and somatic dysfunction of cervical region: Secondary | ICD-10-CM | POA: Diagnosis not present

## 2021-10-29 DIAGNOSIS — M9902 Segmental and somatic dysfunction of thoracic region: Secondary | ICD-10-CM | POA: Diagnosis not present

## 2021-10-29 DIAGNOSIS — M9905 Segmental and somatic dysfunction of pelvic region: Secondary | ICD-10-CM | POA: Diagnosis not present

## 2021-10-29 DIAGNOSIS — M542 Cervicalgia: Secondary | ICD-10-CM | POA: Diagnosis not present

## 2021-10-29 DIAGNOSIS — M546 Pain in thoracic spine: Secondary | ICD-10-CM | POA: Diagnosis not present

## 2021-10-29 DIAGNOSIS — M9903 Segmental and somatic dysfunction of lumbar region: Secondary | ICD-10-CM | POA: Diagnosis not present

## 2021-10-29 DIAGNOSIS — M62830 Muscle spasm of back: Secondary | ICD-10-CM | POA: Diagnosis not present

## 2021-10-30 ENCOUNTER — Other Ambulatory Visit: Payer: Self-pay

## 2021-10-30 ENCOUNTER — Inpatient Hospital Stay: Payer: PPO

## 2021-10-30 ENCOUNTER — Inpatient Hospital Stay: Payer: PPO | Attending: Internal Medicine | Admitting: Internal Medicine

## 2021-10-30 ENCOUNTER — Encounter: Payer: Self-pay | Admitting: Internal Medicine

## 2021-10-30 ENCOUNTER — Encounter: Payer: Self-pay | Admitting: *Deleted

## 2021-10-30 DIAGNOSIS — Z7952 Long term (current) use of systemic steroids: Secondary | ICD-10-CM | POA: Insufficient documentation

## 2021-10-30 DIAGNOSIS — Z5111 Encounter for antineoplastic chemotherapy: Secondary | ICD-10-CM | POA: Diagnosis not present

## 2021-10-30 DIAGNOSIS — R911 Solitary pulmonary nodule: Secondary | ICD-10-CM

## 2021-10-30 DIAGNOSIS — Z801 Family history of malignant neoplasm of trachea, bronchus and lung: Secondary | ICD-10-CM | POA: Insufficient documentation

## 2021-10-30 DIAGNOSIS — Z87891 Personal history of nicotine dependence: Secondary | ICD-10-CM | POA: Insufficient documentation

## 2021-10-30 DIAGNOSIS — F17210 Nicotine dependence, cigarettes, uncomplicated: Secondary | ICD-10-CM | POA: Diagnosis not present

## 2021-10-30 DIAGNOSIS — J432 Centrilobular emphysema: Secondary | ICD-10-CM | POA: Insufficient documentation

## 2021-10-30 DIAGNOSIS — C3412 Malignant neoplasm of upper lobe, left bronchus or lung: Secondary | ICD-10-CM | POA: Diagnosis not present

## 2021-10-30 DIAGNOSIS — R918 Other nonspecific abnormal finding of lung field: Secondary | ICD-10-CM

## 2021-10-30 LAB — CMP (CANCER CENTER ONLY)
ALT: 15 U/L (ref 0–44)
AST: 15 U/L (ref 15–41)
Albumin: 4 g/dL (ref 3.5–5.0)
Alkaline Phosphatase: 71 U/L (ref 38–126)
Anion gap: 6 (ref 5–15)
BUN: 10 mg/dL (ref 8–23)
CO2: 29 mmol/L (ref 22–32)
Calcium: 9.3 mg/dL (ref 8.9–10.3)
Chloride: 98 mmol/L (ref 98–111)
Creatinine: 1.05 mg/dL (ref 0.61–1.24)
GFR, Estimated: >60 mL/min (ref >60)
Glucose, Bld: 121 mg/dL — ABNORMAL HIGH (ref 70–99)
Potassium: 4.1 mmol/L (ref 3.5–5.1)
Sodium: 133 mmol/L — ABNORMAL LOW (ref 135–145)
Total Bilirubin: 0.8 mg/dL (ref 0.3–1.2)
Total Protein: 6.5 g/dL (ref 6.5–8.1)

## 2021-10-30 LAB — CBC WITH DIFFERENTIAL (CANCER CENTER ONLY)
Abs Immature Granulocytes: 0.04 10*3/uL (ref 0.00–0.07)
Basophils Absolute: 0.1 10*3/uL (ref 0.0–0.1)
Basophils Relative: 1 %
Eosinophils Absolute: 0.1 10*3/uL (ref 0.0–0.5)
Eosinophils Relative: 1 %
HCT: 45.4 % (ref 39.0–52.0)
Hemoglobin: 16 g/dL (ref 13.0–17.0)
Immature Granulocytes: 0 %
Lymphocytes Relative: 28 %
Lymphs Abs: 2.6 10*3/uL (ref 0.7–4.0)
MCH: 33.8 pg (ref 26.0–34.0)
MCHC: 35.2 g/dL (ref 30.0–36.0)
MCV: 95.8 fL (ref 80.0–100.0)
Monocytes Absolute: 0.8 10*3/uL (ref 0.1–1.0)
Monocytes Relative: 8 %
Neutro Abs: 5.7 10*3/uL (ref 1.7–7.7)
Neutrophils Relative %: 62 %
Platelet Count: 246 10*3/uL (ref 150–400)
RBC: 4.74 MIL/uL (ref 4.22–5.81)
RDW: 12.7 % (ref 11.5–15.5)
WBC Count: 9.3 10*3/uL (ref 4.0–10.5)
nRBC: 0 % (ref 0.0–0.2)

## 2021-10-30 MED ORDER — PROCHLORPERAZINE MALEATE 10 MG PO TABS: 10.0000 mg | ORAL_TABLET | Freq: Four times a day (QID) | ORAL | 0 refills | Status: DC | PRN

## 2021-10-30 NOTE — Progress Notes (Signed)
START ON PATHWAY REGIMEN - Small Cell Lung ? ? ?  A cycle is every 21 days: ?    Etoposide  ?    Cisplatin  ? ?**Always confirm dose/schedule in your pharmacy ordering system** ? ?Patient Characteristics: ?Newly Diagnosed, Preoperative or Nonsurgical Candidate (Clinical Staging), First Line, Limited Stage, Nonsurgical Candidate ?Therapeutic Status: Newly Diagnosed, Preoperative or Nonsurgical Candidate (Clinical Staging) ?AJCC T Category: cT1c ?AJCC N Category: cN2 ?AJCC M Category: cM0 ?AJCC 8 Stage Grouping: IIIA ?Stage Classification: Limited ?Surgical Candidacy: Nonsurgical Candidate ?Intent of Therapy: ?Curative Intent, Discussed with Patient ?

## 2021-10-30 NOTE — Patient Instructions (Addendum)
Lung Cancer ?Lung cancer is an abnormal growth of cancerous cells that forms a mass (malignant tumor) in a lung. There are several types of lung cancer. The types are based on the appearance of the tumor cells. The two most common types are: ?Non-small cell lung cancer. This type of lung cancer is the most common type. Non-small cell lung cancers include squamous cell carcinoma, adenocarcinoma, and large cell carcinoma. ?Small cell lung cancer. In this type of lung cancer, abnormal cells are smaller than those of non-small cell lung cancer. Small cell lung cancer gets worse (progresses) faster than non-small cell lung cancer. ?What are the causes? ?The most common cause of lung cancer is smoking tobacco. The second most common cause is exposure to a chemical called radon. ?What increases the risk? ?You are more likely to develop this condition if: ?You smoke tobacco. ?You have been exposed to: ?Secondhand tobacco smoke. ?Radon gas. ?Uranium. ?Asbestos. ?Arsenic in drinking water. ?Air pollution and diesel exhaust. ?You have a family or personal history of lung cancer. ?You have had lung radiation therapy in the past. ?You are older than age 67. ?What are the signs or symptoms? ?In the early stages, you may not have any symptoms. As the cancer progresses, symptoms may include: ?A lasting cough, possibly with blood. ?Fatigue. ?Unexplained weight loss. ?Shortness of breath. ?High-pitched whistling sounds when you breathe, most often when you breathe out (wheezing). ?Chest pain. ?Loss of appetite. ?Symptoms of advanced lung cancer include: ?Hoarseness. ?Bone or joint pain. ?Weakness. ?Change in the structure of the fingernails (clubbing), so that the nail looks like an upside-down spoon. ?Swelling of the face or arms. ?Inability to move the face (paralysis). ?Drooping eyelids. ?How is this diagnosed? ?This condition may be diagnosed based on: ?Your symptoms and medical history. ?A physical exam. ?A chest X-ray. ?A CT  scan. ?Blood tests. ?Sputum tests. ?Removal of a sample of lung tissue (lung biopsy) for testing. ?Your cancer will be assessed (staged) to determine how severe it is and how much it has spread (metastasized). ?How is this treated? ?Treatment depends on the type and stage of your cancer. Treatment may include one or more of the following: ?Surgery to remove as much of the cancer as possible. Lymph nodes in the area may be removed and tested for cancer as well. ?Medicines that kill cancer cells (chemotherapy). ?High-energy rays that kill cancer cells (radiation therapy). ?Targeted therapy. This targets specific parts of cancer cells and the area around them to block the growth and spread of the cancer. Targeted therapy can help limit the damage to healthy cells. ?Immunotherapy. This treatment uses a person's own immune system to fight cancer by either boosting the immune system or changing how the immune system works. ?Follow these instructions at home: ? ?Do not use any products that contain nicotine or tobacco. These products include cigarettes, chewing tobacco, and vaping devices, such as e-cigarettes. If you need help quitting, ask your health care provider. ?Do not drink alcohol. ?If you are admitted to the hospital, make sure your cancer specialist (oncologist) is aware. Your cancer may affect your treatment for other conditions. ?Take over-the-counter and prescription medicines only as told by your health care provider. ?Work with your health care provider to manage any side effects of treatment. ?Keep all follow-up visits. This is important. ?Where to find support ?Consider joining a local support group for people who have been diagnosed with lung cancer. ?Where to find more information ?American Cancer Society: www.cancer.org ?[place]  www.cancer.gov ?Contact a health care provider if you: ?Lose weight without trying. ?Have a persistent cough and wheezing. ?Feel short of breath. ?Get  tired easily. ?Have bone or joint pain. ?Have difficulty swallowing. ?Notice that your voice is changing or getting hoarse. ?Have pain that does not get better with medicine. ?Get help right away if you: ?Cough up blood. ?Have chest pain or new breathing problems. ?Have a fever. ?Have swelling in an ankle, leg, or arm, or the face or neck. ?Have paralysis in your face. ?Are very confused. ?Have a drooping eyelid. ?These symptoms may represent a serious problem that is an emergency. Do not wait to see if the symptoms will go away. Get medical help right away. Call your local emergency services (911 in the [place] Do not drive yourself to the hospital. ?Summary ?Lung cancer is an abnormal growth of cancerous cells that forms a mass (malignant tumor) in a lung. ?There are several types of lung cancer. The types are based on the appearance of the tumor cells. The two most common types are non-small cell and small cell. ?The most common cause of lung cancer is smoking tobacco. ?Early symptoms include a lasting cough, possibly with blood, and fatigue, unexplained weight loss, and shortness of breath. ?After diagnosis, treatment depends on the type and stage of your cancer. ?This information is not intended to replace advice given to you by your health care provider. Make sure you discuss any questions you have with your health care provider. ?Document Revised: 12/04/2020 Document Reviewed: 12/04/2020 ?Elsevier Patient Education ? [place]. ?Smoking Tobacco Information, Adult ?Smoking tobacco can be harmful to your health. Tobacco contains a toxic colorless chemical called nicotine. Nicotine causes changes in your brain that make you want more and more. This is called addiction. This can make it hard to stop smoking once you start. Tobacco also has other toxic chemicals that can hurt your body and raise your risk of many cancers. ?Menthol or "lite" tobacco or cigarette brands are not safer than regular brands. ?How  can smoking tobacco affect me? ?Smoking tobacco puts you at risk for: ?Cancer. Smoking is most commonly associated with lung cancer, but can also lead to cancer in other parts of the body. ?Chronic obstructive pulmonary disease (COPD). This is a long-term lung condition that makes it hard to breathe. It also gets worse over time. ?High blood pressure (hypertension), heart disease, stroke, heart attack, and lung infections, such as pneumonia. ?Cataracts. This is when the lenses in the eyes become clouded. ?Digestive problems. This may include peptic ulcers, heartburn, and gastroesophageal reflux disease (GERD). ?Oral health problems, such as gum disease, mouth sores, and tooth loss. ?Loss of taste and smell. ?Smoking also affects how you look and smell. Smoking may cause: ?Wrinkles. ?Yellow or stained teeth, fingers, and fingernails. ?Bad breath. ?Bad-smelling clothes and hair. ?Smoking tobacco can also affect your social life, because: ?It may be challenging to find places to smoke when away from home. Many workplaces, [employer], hotels, and public places are tobacco-free. ?Smoking is expensive. This is due to the cost of tobacco and the long-term costs of treating health problems from smoking. ?Secondhand smoke may affect those around you. Secondhand smoke can cause lung cancer, breathing problems, and heart disease. Children of smokers have a higher risk for: ?Sudden infant death syndrome (SIDS). ?Ear infections. ?Lung infections. ?What actions can I take to prevent health problems? ?Quit smoking ? ?Do not start smoking. Quit if you already smoke. ?Do not replace cigarette smoking  with vaping devices, such as e-cigarettes. ?Make a plan to quit smoking and commit to it. Look for programs to help you, and ask your health care provider for recommendations and ideas. Set a date and write down all the reasons you want to quit. ?Let your friends and family know you are quitting so they can help and support you.  Consider finding friends who also want to quit. It can be easier to quit with someone else, so that you can support each other. ?Talk with your health care provider about using nicotine replacement medicines

## 2021-10-30 NOTE — Progress Notes (Signed)
? ? [place] ?[phone]   [phone] ? ?CONSULT NOTE ? ?REFERRING PHYSICIAN: Dr. Modesto Charon ? ?REASON FOR CONSULTATION:  ?66 years old white male recently diagnosed with lung cancer. ? ?HPI ?Jonathan Castro is a 66 y.o. male with past medical history significant for rheumatoid arthritis and has been on treatment with methotrexate and folic acid as well as history of COPD and long history of smoking.  The patient mentioned that he was on the CT screening program for the last 5 years.  He missed last year but the CT screening performed this year on September 01, 2021 showed a large pulmonary nodule in the central aspect of the left upper lobe with a volume Dravet mean diameter estimated at 23.1 mm.  The scan also showed multiple enlarged lymph nodes most evident in the lower left paratracheal nodal station measuring 1.6 cm in short axis and AP window nodal station 2.0 cm in short axis.  The patient was referred to Dr. Roxan Hockey and a PET scan was performed on 10/09/2021 and it showed intensely hypermetabolic left hilar mass with SUV max of 10.5.  There are adjacent hypermetabolic left hilar and AP window lymph nodes the AP window nodes measure 1.6 cm and 1.9 cm with SUV max of 11.8.  There was no contralateral hypermetabolic mediastinal or hilar adenopathy and no evidence of distant metastatic disease.  There was no suspicious peripheral lung lesions identified.  On October 17, 2021 the patient underwent bronchoscopy with endobronchial ultrasound and needle aspirations and mediastinoscopy under the care of Dr. Roxan Hockey. ?The final pathology ([id number]) from the 4L excisional lymph node biopsy was consistent with metastatic small cell carcinoma. By immunohistochemistry, the neoplastic cells are positive for cytokeratin AE1/3, chromogranin (weak), synaptophysin and TTF-1 but negative for Napsin A. ?Dr. Roxan Hockey kindly referred the patient to me today for evaluation and  recommendation regarding treatment of his condition. ?When seen today he is feeling fine except for the hoarseness of his voice after the bronchoscopy.  He denied having any chest pain, shortness of breath, cough or hemoptysis.  He has no recent weight loss or night sweats.  He has no fever or chills.  He has suspicious ear infection and he is scheduled to see ENT in few weeks.  He has no headache or visual changes. ?Family history significant for father with lung cancer and died at age 39.  Mother still alive at age 78 and brother had history of kidney cancer. ?The patient is a widow and has 2 sons.  He was accompanied today by his daughter-in-law Coralyn Mark.  He has a history for smoking 1 pack/day for around 54 years.  He quit smoking 2 weeks ago.  He also drinks few alcoholic drinks every night.  He also has a history of marijuana smoking. ? ?HPI ? ?Past Medical History:  ?Diagnosis Date  ? Arthritis   ? COPD (chronic obstructive pulmonary disease) ([place])   ? ? ?Past Surgical History:  ?Procedure Laterality Date  ? MEDIASTINOSCOPY N/A 10/20/2021  ? Procedure: MEDIASTINOSCOPY;  Surgeon: Melrose Nakayama, MD;  Location[facility];  Service: Thoracic;  Laterality: N/A;  ? ROTATOR CUFF REPAIR Left   ? VIDEO BRONCHOSCOPY WITH ENDOBRONCHIAL ULTRASOUND N/A 10/20/2021  ? Procedure: VIDEO BRONCHOSCOPY WITH ENDOBRONCHIAL ULTRASOUND WITH BIOPSIES;  Surgeon: Melrose Nakayama, MD;  Location: [place];  Service: Thoracic;  Laterality: N/A;  ? ? ?Family History  ?Problem Relation Age of Onset  ? Cancer - Lung Father   ? ? ?  Social History ?Social History  ? ?Tobacco Use  ? Smoking status: Former  ?  Packs/day: 1.00  ?  Years: 52.00  ?  Pack years: 52.00  ?  Types: Cigarettes  ?  Quit date: 10/15/2021  ?  Years since quitting: 0.0  ?  Passive exposure: Never  ? Smokeless tobacco: Never  ?Vaping Use  ? Vaping Use: Never used  ?Substance Use Topics  ? Alcohol use: Yes  ?  Alcohol/week: 14.0 standard drinks  ?  Types: 6 Cans of beer, 8  Shots of liquor per week  ? Drug use: Yes  ?  Frequency: 1.0 times per week  ?  Types: Marijuana  ?  Comment: last time 10/11/21  ? ? ?Allergies  ?Allergen Reactions  ? Leflunomide Diarrhea  ? Penicillamine Other (See Comments)  ?  Unknown to patient   ? Penicillins   ?  Unknown childhood allergy   ? ? ?Current Outpatient Medications  ?Medication Sig Dispense Refill  ? acetaminophen (TYLENOL) 650 MG CR tablet Take 1,300 mg by mouth daily.    ? albuterol (VENTOLIN HFA) 108 (90 Base) MCG/ACT inhaler Inhale 2 puffs into the lungs every 6 (six) hours as needed for wheezing or shortness of breath.    ? fexofenadine-pseudoephedrine (ALLEGRA-D) 60-120 MG 12 hr tablet Take 1 tablet by mouth 2 (two) times daily as needed (allergies).    ? folic acid (FOLVITE) 1 MG tablet Take 1 mg by mouth daily.    ? methotrexate 50 MG/2ML injection Inject 15 mg into the vein every Wednesday.    ? oxyCODONE (ROXICODONE) 5 MG immediate release tablet Take 1-2 tablets (5-10 mg total) by mouth every 6 (six) hours as needed for severe pain. 25 tablet 0  ? predniSONE (DELTASONE) 5 MG tablet Take 5 mg by mouth daily as needed (arthritis flare).    ? ?No current facility-administered medications for this visit.  ? ? ?Review of Systems ? ?Constitutional: negative ?Eyes: negative ?Ears, nose, mouth, throat, and face: positive for hoarseness ?Respiratory: negative ?Cardiovascular: negative ?Gastrointestinal: negative ?Genitourinary:negative ?Integument/breast: negative ?Hematologic/lymphatic: negative ?Musculoskeletal:negative ?Neurological: negative ?Behavioral/Psych: negative ?Endocrine: negative ?Allergic/Immunologic: negative ? ?Physical Exam ? ?FMB:WGYKZ, healthy, no distress, well nourished, and well developed ?SKIN: skin color, texture, turgor are normal, no rashes or significant lesions ?HEAD: Normocephalic, No masses, lesions, tenderness or abnormalities ?EYES: normal, PERRLA, Conjunctiva are pink and non-injected ?EARS: External ears normal,  Canals clear ?OROPHARYNX:no exudate, no erythema, and lips, buccal mucosa, and tongue normal  ?NECK: supple, no adenopathy, no JVD ?LYMPH:  no palpable lymphadenopathy, no hepatosplenomegaly ?LUNGS: clear to auscultation , and palpation ?HEART: regular rate & rhythm, no murmurs, and no gallops ?ABDOMEN:abdomen soft, non-tender, normal bowel sounds, and no masses or organomegaly ?BACK: Back symmetric, no curvature., No CVA tenderness ?EXTREMITIES:no joint deformities, effusion, or inflammation, no edema  ?NEURO: alert & oriented x 3 with fluent speech, no focal motor/sensory deficits ? ?PERFORMANCE STATUS: ECOG 1 ? ?LABORATORY DATA: ?Lab Results  ?Component Value Date  ? WBC 9.3 10/30/2021  ? HGB 16.0 10/30/2021  ? HCT 45.4 10/30/2021  ? MCV 95.8 10/30/2021  ? PLT 246 10/30/2021  ? ? ?  Chemistry   ?   ?Component Value Date/Time  ? NA 133 (L) 10/30/2021 1043  ? K 4.1 10/30/2021 1043  ? CL 98 10/30/2021 1043  ? CO2 29 10/30/2021 1043  ? BUN 10 10/30/2021 1043  ? CREATININE 1.05 10/30/2021 1043  ?    ?Component Value Date/Time  ? CALCIUM 9.3  10/30/2021 1043  ? ALKPHOS 71 10/30/2021 1043  ? AST 15 10/30/2021 1043  ? ALT 15 10/30/2021 1043  ? BILITOT 0.8 10/30/2021 1043  ?  ? ? ? ?RADIOGRAPHIC STUDIES: ?DG Chest 2 View ? ?Result Date: 10/18/2021 ?CLINICAL DATA:  [id number] EXAM: CHEST - 2 VIEW COMPARISON:  Chest x-ray 12/16/2018. FINDINGS: The heart and mediastinal contours are within normal limits. 7 mm rounded density again noted overlying the right chest likely representing a nipple shadow. No focal consolidation. No pulmonary edema. No blunting of bilateral costophrenic angles with no frank pleural effusion. No pneumothorax. No acute osseous abnormality. IMPRESSION: No active cardiopulmonary disease. Electronically Signed   By: Iven Finn M.D.   On: 10/18/2021 20:03  ? ?NM PET Image Initial (PI) Skull Base To Thigh ? ?Result Date: 10/10/2021 ?CLINICAL DATA:  Initial treatment strategy for left upper lobe pulmonary  nodule with mediastinal adenopathy on lung cancer screening CT, highly suspicious for malignancy. EXAM: NUCLEAR MEDICINE PET SKULL BASE TO THIGH TECHNIQUE: 8.59 mCi F-18 FDG was injected intravenously.

## 2021-10-30 NOTE — Progress Notes (Signed)
Oncology Nurse Navigator Documentation ? ? ?  10/30/2021  ? 12:00 PM 10/21/2021  ?  7:00 AM  ?Oncology Nurse Navigator Flowsheets  ?Abnormal Finding Date 09/02/2021   ?Confirmed Diagnosis Date 10/20/2021   ?Diagnosis Status Confirmed Diagnosis Complete   ?Planned Course of Treatment Chemo/Radiation Concurrent   ?Navigator Follow Up Date: 10/31/2021 10/30/2021  ?Navigator Follow Up Reason: Appointment Review New Patient Appointment  ?Navigator Location [facility]  ?Referral Date to RadOnc/MedOnc  10/21/2021  ?Navigator Encounter Type Clinic/MDC;Initial MedOnc Other:  ?Patient Visit Type Initial;MedOnc Other  ?Treatment Phase Pre-Tx/Tx Discussion Abnormal Scans  ?Barriers/Navigation Needs Coordination of Care;Education Coordination of Care  ?Education Newly Diagnosed Cancer Education;Other;Understanding Cancer/ Treatment Options   ?Interventions Coordination of Care;Psycho-Social Support;Education/I spoke to Jonathan Castro and his daughter in law today.  According to Dr. Julien Nordmann patient is limited stage lung cancer. I helped to explain his plan of care and next steps.  I also added information to patients AVS. It was a pleasure meeting them and will follow up on appts and treatment plan schedule.  Coordination of Care  ?Acuity Level 3-Moderate Needs (3-4 Barriers Identified) Level 2-Minimal Needs (1-2 Barriers Identified)  ?Coordination of Care Other Other  ?Education Method Verbal;Other   ?Time Spent with Patient 45 30  ?  ?

## 2021-10-31 ENCOUNTER — Encounter: Payer: Self-pay | Admitting: *Deleted

## 2021-10-31 DIAGNOSIS — M9903 Segmental and somatic dysfunction of lumbar region: Secondary | ICD-10-CM | POA: Diagnosis not present

## 2021-10-31 DIAGNOSIS — M546 Pain in thoracic spine: Secondary | ICD-10-CM | POA: Diagnosis not present

## 2021-10-31 DIAGNOSIS — M62830 Muscle spasm of back: Secondary | ICD-10-CM | POA: Diagnosis not present

## 2021-10-31 DIAGNOSIS — M9902 Segmental and somatic dysfunction of thoracic region: Secondary | ICD-10-CM | POA: Diagnosis not present

## 2021-10-31 DIAGNOSIS — M9901 Segmental and somatic dysfunction of cervical region: Secondary | ICD-10-CM | POA: Diagnosis not present

## 2021-10-31 DIAGNOSIS — M9905 Segmental and somatic dysfunction of pelvic region: Secondary | ICD-10-CM | POA: Diagnosis not present

## 2021-10-31 DIAGNOSIS — M542 Cervicalgia: Secondary | ICD-10-CM | POA: Diagnosis not present

## 2021-10-31 NOTE — Progress Notes (Signed)
Oncology Nurse Navigator Documentation ? ? ?  10/31/2021  ?  9:00 AM 10/30/2021  ? 12:00 PM 10/21/2021  ?  7:00 AM  ?Oncology Nurse Navigator Flowsheets  ?Abnormal Finding Date  09/02/2021   ?Confirmed Diagnosis Date  10/20/2021   ?Diagnosis Status  Confirmed Diagnosis Complete   ?Planned Course of Treatment  Chemo/Radiation Concurrent   ?Navigator Follow Up Date: 11/03/2021 10/31/2021 10/30/2021  ?Navigator Follow Up Reason: Appointment Review Appointment Review New Patient Appointment  ?Navigator Location [facility]  ?Referral Date to RadOnc/MedOnc   10/21/2021  ?Navigator Encounter Type Appt/Treatment Plan Review Clinic/MDC;Initial MedOnc Other:  ?Patient Visit Type Other Initial;MedOnc Other  ?Treatment Phase Pre-Tx/Tx Discussion Pre-Tx/Tx Discussion Abnormal Scans  ?Barriers/Navigation Needs Coordination of Care Coordination of Care;Education Coordination of Care  ?Education  Newly Diagnosed Cancer Education;Other;Understanding Cancer/ Treatment Options   ?Interventions Coordination of Care/I followed up to see if patient's treatment schedule has been set up at this time. It has not and will follow up in schedule.  Coordination of Care;Psycho-Social Support;Education Coordination of Care  ?Acuity Level 2-Minimal Needs (1-2 Barriers Identified) Level 3-Moderate Needs (3-4 Barriers Identified) Level 2-Minimal Needs (1-2 Barriers Identified)  ?Coordination of Care Other Other Other  ?Education Method  Verbal;Other   ?Time Spent with Patient 15 45 30  ?  ?

## 2021-11-03 ENCOUNTER — Telehealth: Payer: Self-pay | Admitting: Internal Medicine

## 2021-11-03 NOTE — Telephone Encounter (Signed)
Scheduled per 5/4 los, pt has been called and confirmed  ?

## 2021-11-03 NOTE — Progress Notes (Signed)
Pharmacist Chemotherapy Monitoring - Initial Assessment   ? ?Anticipated start date: 11/10/21  ? ?The following has been reviewed per standard work regarding the patient's treatment regimen: ?The patient's diagnosis, treatment plan and drug doses, and organ/hematologic function ?Lab orders and baseline tests specific to treatment regimen  ?The treatment plan start date, drug sequencing, and pre-medications ?Prior authorization status  ?Patient's documented medication list, including drug-drug interaction screen and prescriptions for anti-emetics and supportive care specific to the treatment regimen ?The drug concentrations, fluid compatibility, administration routes, and timing of the medications to be used ?The patient's access for treatment and lifetime cumulative dose history, if applicable  ?The patient's medication allergies and previous infusion related reactions, if applicable  ? ?Changes made to treatment plan:  ?N/A ? ?Follow up needed:  ?N/A ? ?Kennith Center, Pharm.D., CPP ?11/03/2021@2 :37 PM ? ? ? ?

## 2021-11-05 ENCOUNTER — Telehealth: Payer: Self-pay | Admitting: *Deleted

## 2021-11-07 ENCOUNTER — Other Ambulatory Visit: Payer: Self-pay

## 2021-11-07 ENCOUNTER — Inpatient Hospital Stay: Payer: PPO

## 2021-11-07 MED FILL — Dexamethasone Sodium Phosphate Inj 100 MG/10ML: INTRAMUSCULAR | Qty: 1 | Status: AC

## 2021-11-07 MED FILL — Fosaprepitant Dimeglumine For IV Infusion 150 MG (Base Eq): INTRAVENOUS | Qty: 5 | Status: AC

## 2021-11-08 ENCOUNTER — Ambulatory Visit
Admission: RE | Admit: 2021-11-08 | Discharge: 2021-11-08 | Disposition: A | Discharge status: Home / Self Care | Payer: PPO | Source: Ambulatory Visit | Attending: Thoracic Surgery (Cardiothoracic Vascular Surgery) | Admitting: Thoracic Surgery (Cardiothoracic Vascular Surgery)

## 2021-11-08 DIAGNOSIS — R911 Solitary pulmonary nodule: Secondary | ICD-10-CM

## 2021-11-08 DIAGNOSIS — C7989 Secondary malignant neoplasm of other specified sites: Secondary | ICD-10-CM | POA: Diagnosis not present

## 2021-11-08 MED ORDER — GADOPICLENOL 0.5 MMOL/ML IV SOLN
8.0000 mL | Freq: Once | INTRAVENOUS | Status: AC | PRN
Start: 2021-11-08 — End: 2021-11-08
  Administered 2021-11-08: 8 mL via INTRAVENOUS

## 2021-11-10 ENCOUNTER — Inpatient Hospital Stay: Payer: PPO

## 2021-11-10 ENCOUNTER — Other Ambulatory Visit: Payer: Self-pay

## 2021-11-10 VITALS — BP 137/86 | HR 65 | Temp 98.2°F | Resp 18 | Wt 161.8 lb

## 2021-11-10 DIAGNOSIS — Z5111 Encounter for antineoplastic chemotherapy: Secondary | ICD-10-CM | POA: Diagnosis not present

## 2021-11-10 DIAGNOSIS — C3412 Malignant neoplasm of upper lobe, left bronchus or lung: Secondary | ICD-10-CM

## 2021-11-10 LAB — CMP (CANCER CENTER ONLY)
ALT: 13 U/L (ref 0–44)
AST: 15 U/L (ref 15–41)
Albumin: 4 g/dL (ref 3.5–5.0)
Alkaline Phosphatase: 64 U/L (ref 38–126)
Anion gap: 6 (ref 5–15)
BUN: 9 mg/dL (ref 8–23)
CO2: 25 mmol/L (ref 22–32)
Calcium: 9.2 mg/dL (ref 8.9–10.3)
Chloride: 102 mmol/L (ref 98–111)
Creatinine: 0.94 mg/dL (ref 0.61–1.24)
GFR, Estimated: >60 mL/min (ref >60)
Glucose, Bld: 95 mg/dL (ref 70–99)
Potassium: 4.3 mmol/L (ref 3.5–5.1)
Sodium: 133 mmol/L — ABNORMAL LOW (ref 135–145)
Total Bilirubin: 0.6 mg/dL (ref 0.3–1.2)
Total Protein: 6.7 g/dL (ref 6.5–8.1)

## 2021-11-10 LAB — CBC WITH DIFFERENTIAL (CANCER CENTER ONLY)
Abs Immature Granulocytes: 0.03 10*3/uL (ref 0.00–0.07)
Basophils Absolute: 0.1 10*3/uL (ref 0.0–0.1)
Basophils Relative: 1 %
Eosinophils Absolute: 0.2 10*3/uL (ref 0.0–0.5)
Eosinophils Relative: 3 %
HCT: 44.1 % (ref 39.0–52.0)
Hemoglobin: 15.7 g/dL (ref 13.0–17.0)
Immature Granulocytes: 0 %
Lymphocytes Relative: 33 %
Lymphs Abs: 2.4 10*3/uL (ref 0.7–4.0)
MCH: 33.3 pg (ref 26.0–34.0)
MCHC: 35.6 g/dL (ref 30.0–36.0)
MCV: 93.4 fL (ref 80.0–100.0)
Monocytes Absolute: 0.8 10*3/uL (ref 0.1–1.0)
Monocytes Relative: 11 %
Neutro Abs: 3.7 10*3/uL (ref 1.7–7.7)
Neutrophils Relative %: 52 %
Platelet Count: 227 10*3/uL (ref 150–400)
RBC: 4.72 MIL/uL (ref 4.22–5.81)
RDW: 12.3 % (ref 11.5–15.5)
WBC Count: 7.2 10*3/uL (ref 4.0–10.5)
nRBC: 0 % (ref 0.0–0.2)

## 2021-11-10 LAB — MAGNESIUM: Magnesium: 2 mg/dL (ref 1.7–2.4)

## 2021-11-10 MED ORDER — SODIUM CHLORIDE 0.9 % IV SOLN: Freq: Once | INTRAVENOUS | Status: AC

## 2021-11-10 MED ORDER — POTASSIUM CHLORIDE IN NACL 20-0.9 MEQ/L-% IV SOLN
Freq: Once | INTRAVENOUS | Status: AC
  Filled 2021-11-10: qty 1000

## 2021-11-10 MED ORDER — SODIUM CHLORIDE 0.9 % IV SOLN
75.0000 mg/m2 | Freq: Once | INTRAVENOUS | Status: AC
  Administered 2021-11-10: 139 mg via INTRAVENOUS
  Filled 2021-11-10: qty 139

## 2021-11-10 MED ORDER — SODIUM CHLORIDE 0.9 % IV SOLN
10.0000 mg | Freq: Once | INTRAVENOUS | Status: AC
  Administered 2021-11-10: 10 mg via INTRAVENOUS
  Filled 2021-11-10: qty 10

## 2021-11-10 MED ORDER — MAGNESIUM SULFATE 2 GM/50ML IV SOLN
2.0000 g | Freq: Once | INTRAVENOUS | Status: AC
  Administered 2021-11-10: 2 g via INTRAVENOUS
  Filled 2021-11-10: qty 50

## 2021-11-10 MED ORDER — SODIUM CHLORIDE 0.9% FLUSH: 10.0000 mL | INTRAVENOUS | Status: DC | PRN

## 2021-11-10 MED ORDER — PALONOSETRON HCL INJECTION 0.25 MG/5ML
0.2500 mg | Freq: Once | INTRAVENOUS | Status: AC
  Administered 2021-11-10: 0.25 mg via INTRAVENOUS
  Filled 2021-11-10: qty 5

## 2021-11-10 MED ORDER — SODIUM CHLORIDE 0.9 % IV SOLN
150.0000 mg | Freq: Once | INTRAVENOUS | Status: AC
  Administered 2021-11-10: 150 mg via INTRAVENOUS
  Filled 2021-11-10: qty 150

## 2021-11-10 MED ORDER — HEPARIN SOD (PORK) LOCK FLUSH 100 UNIT/ML IV SOLN: 500.0000 [IU] | Freq: Once | INTRAVENOUS | Status: DC | PRN

## 2021-11-10 MED ORDER — SODIUM CHLORIDE 0.9 % IV SOLN
110.0000 mg/m2 | Freq: Once | INTRAVENOUS | Status: AC
  Administered 2021-11-10: 200 mg via INTRAVENOUS
  Filled 2021-11-10: qty 10

## 2021-11-10 MED FILL — Dexamethasone Sodium Phosphate Inj 100 MG/10ML: INTRAMUSCULAR | Qty: 1 | Status: AC

## 2021-11-10 NOTE — Patient Instructions (Signed)
[facility]  Discharge Instructions: ?Thank you for choosing [facility] to provide your oncology and hematology care.  ? ?If you have a lab appointment with the [facility], please go directly to the [facility] and check in at the registration area. ?  ?Wear comfortable clothing and clothing appropriate for easy access to any Portacath or PICC line.  ? ?We strive to give you quality time with your provider. You may need to reschedule your appointment if you arrive late (15 or more minutes).  Arriving late affects you and other patients whose appointments are after yours.  Also, if you miss three or more appointments without notifying the office, you may be dismissed from the clinic at the provider?s discretion.    ?  ?For prescription refill requests, have your pharmacy contact our office and allow 72 hours for refills to be completed.   ? ?Today you received the following chemotherapy and/or immunotherapy agents: Etoposide, Cisplatin    ?  ?To help prevent nausea and vomiting after your treatment, we encourage you to take your nausea medication as directed. ? ?BELOW ARE SYMPTOMS THAT SHOULD BE REPORTED IMMEDIATELY: ?*FEVER GREATER THAN 100.4 F (38 ?C) OR HIGHER ?*CHILLS OR SWEATING ?*NAUSEA AND VOMITING THAT IS NOT CONTROLLED WITH YOUR NAUSEA MEDICATION ?*UNUSUAL SHORTNESS OF BREATH ?*UNUSUAL BRUISING OR BLEEDING ?*URINARY PROBLEMS (pain or burning when urinating, or frequent urination) ?*BOWEL PROBLEMS (unusual diarrhea, constipation, pain near the anus) ?TENDERNESS IN MOUTH AND THROAT WITH OR WITHOUT PRESENCE OF ULCERS (sore throat, sores in mouth, or a toothache) ?UNUSUAL RASH, SWELLING OR PAIN  ?UNUSUAL VAGINAL DISCHARGE OR ITCHING  ? ?Items with * indicate a potential emergency and should be followed up as soon as possible or go to the Emergency Department if any problems should occur. ? ?Please show the CHEMOTHERAPY ALERT CARD or IMMUNOTHERAPY ALERT CARD at  check-in to the Emergency Department and triage nurse. ? ?Should you have questions after your visit or need to cancel or reschedule your appointment, please contact [facility]  Dept: [phone]  and follow the prompts.  Office hours are 8:00 a.m. to 4:30 p.m. Monday - Friday. Please note that voicemails left after 4:00 p.m. may not be returned until the following business day.  We are closed weekends and major holidays. You have access to a nurse at all times for urgent questions. Please call the main number to the clinic Dept: [phone] and follow the prompts. ? ? ?For any non-urgent questions, you may also contact your provider using MyChart. We now offer e-Visits for anyone 67 and older to request care online for non-urgent symptoms. For details visit [email or web address]. ?  ?Also download the MyChart app! Go to the app store, search "MyChart", open the app, select , and log in with your MyChart username and password. ? ?Due to Covid, a mask is required upon entering the hospital/clinic. If you do not have a mask, one will be given to you upon arrival. For doctor visits, patients may have 1 support Shaquna Geigle aged 67 or older with them. For treatment visits, patients cannot have anyone with them due to current Covid guidelines and our immunocompromised population.  ? ?Etoposide ?What is this medication? ?ETOPOSIDE (e toe POE side) is a chemotherapy drug. It is used to treat small cell lung cancer and other cancers. ?This medicine may be used for other purposes; ask your health care provider or pharmacist if you have questions. ?COMMON BRAND NAME(S):  VePesid ?What should I tell my care team before I take this medication? ?They need to know if you have any of these conditions: ?infection ?kidney disease ?liver disease ?low blood counts, like low white cell, platelet, or red cell counts ?an unusual or allergic reaction to etoposide, other medicines, foods, dyes, or  preservatives ?pregnant or trying to get pregnant ?breast-feeding ?How should I use this medication? ?Take this medicine by mouth with a glass of water. Follow the directions on the prescription label. Do not open, crush, or chew the capsules. It is advisable to wear gloves when handling this medicine. Take your medicine at regular intervals. Do not take it more often than directed. Do not stop taking except on your doctor's advice. ?Talk to your pediatrician regarding the use of this medicine in children. Special care may be needed. ?Overdosage: If you think you have taken too much of this medicine contact a poison control center or emergency room at once. ?NOTE: This medicine is only for you. Do not share this medicine with others. ?What if I miss a dose? ?If you miss a dose, take it as soon as you can. If it is almost time for your next dose, take only that dose. Do not take double or extra doses. ?What may interact with this medication? ?This medicine may interact with the following medications: ?cyclosporine ?warfarin ?This list may not describe all possible interactions. Give your health care provider a list of all the medicines, herbs, non-prescription drugs, or dietary supplements you use. Also tell them if you smoke, drink alcohol, or use illegal drugs. Some items may interact with your medicine. ?What should I watch for while using this medication? ?Visit your doctor for checks on your progress. This drug may make you feel generally unwell. This is not uncommon, as chemotherapy can affect healthy cells as well as cancer cells. Report any side effects. Continue your course of treatment even though you feel ill unless your doctor tells you to stop. ?In some cases, you may be given additional medicines to help with side effects. Follow all directions for their use. ?Call your doctor or health care professional for advice if you get a fever, chills or sore throat, or other symptoms of a cold or flu. Do not  treat yourself. This drug decreases your body's ability to fight infections. Try to avoid being around people who are sick. ?This medicine may increase your risk to bruise or bleed. Call your doctor or health care professional if you notice any unusual bleeding. ?Talk to your doctor about your risk of cancer. You may be more at risk for certain types of cancers if you take this medicine. ?Do not become pregnant while taking this medicine or for at least 6 months after stopping it. Women should inform their doctor if they wish to become pregnant or think they might be pregnant. Women of child-bearing potential will need to have a negative pregnancy test before starting this medicine. There is a potential for serious side effects to an unborn child. Talk to your health care professional or pharmacist for more information. Do not breast-feed an infant while taking this medicine. ?Men must use a latex condom during sexual contact with a woman while taking this medicine and for at least 4 months after stopping it. A latex condom is needed even if you have had a vasectomy. Contact your doctor right away if your partner becomes pregnant. Do not donate sperm while taking this medicine and for 4 months after you  stop taking this medicine. Men should inform their doctors if they wish to father a child. This medicine may lower sperm counts. ?What side effects may I notice from receiving this medication? ?Side effects that you should report to your doctor or health care professional as soon as possible: ?allergic reactions like skin rash, itching or hives, swelling of the face, lips, or tongue ?low blood counts - this medicine may decrease the number of white blood cells, red blood cells, and platelets. You may be at increased risk for infections and bleeding ?nausea, vomiting ?redness, blistering, peeling or loosening of the skin, including inside the mouth ?signs and symptoms of infection like fever; chills; cough; sore  throat; pain or trouble passing urine ?signs and symptoms of low red blood cells or anemia such as unusually weak or tired; feeling faint or lightheaded; falls; breathing problems ?unusual bruising or bleeding ?S

## 2021-11-10 NOTE — Progress Notes (Signed)
0.9% Sodium Chloride, 59mL used for etoposide ?Lot: [id number], Exp: May 24 ? ?Raul Del [place], RPH, BCPS, BCOP ?11/10/2021 ?11:22 AM ? ?

## 2021-11-11 ENCOUNTER — Telehealth: Payer: Self-pay | Admitting: *Deleted

## 2021-11-11 ENCOUNTER — Inpatient Hospital Stay: Payer: PPO

## 2021-11-11 VITALS — BP 150/74 | HR 81 | Temp 97.9°F | Resp 18

## 2021-11-11 DIAGNOSIS — C3412 Malignant neoplasm of upper lobe, left bronchus or lung: Secondary | ICD-10-CM

## 2021-11-11 DIAGNOSIS — Z5111 Encounter for antineoplastic chemotherapy: Secondary | ICD-10-CM | POA: Diagnosis not present

## 2021-11-11 MED ORDER — SODIUM CHLORIDE 0.9 % IV SOLN: Freq: Once | INTRAVENOUS | Status: AC

## 2021-11-11 MED ORDER — SODIUM CHLORIDE 0.9 % IV SOLN
10.0000 mg | Freq: Once | INTRAVENOUS | Status: AC
  Administered 2021-11-11: 10 mg via INTRAVENOUS
  Filled 2021-11-11: qty 10

## 2021-11-11 MED ORDER — SODIUM CHLORIDE 0.9 % IV SOLN
100.0000 mg/m2 | Freq: Once | INTRAVENOUS | Status: AC
  Administered 2021-11-11: 190 mg via INTRAVENOUS
  Filled 2021-11-11: qty 9.5

## 2021-11-11 MED FILL — Dexamethasone Sodium Phosphate Inj 100 MG/10ML: INTRAMUSCULAR | Qty: 1 | Status: AC

## 2021-11-11 NOTE — Progress Notes (Signed)
?Radiation Oncology         ([phone]) [phone] ?________________________________ ? ?Initial Outpatient Consultation ? ?Name: Jonathan Castro MRN: [id number]  ?Date: 11/12/2021  DOB: 05/22/1956 ? ?TG:PQDIY, Carloyn Manner, MD  Curt Bears, MD  ? ?REFERRING PHYSICIAN: Curt Bears, MD ? 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 ? ?Large left upper lobe pulmonary nodule: small cell carcinoma with lymph node metastases ? ? 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 ?HISTORY OF PRESENT ILLNESS::Jonathan Castro is a 66 y.o. male who is accompanied by his son. he is seen as a courtesy of Dr. Julien Nordmann for an opinion concerning radiation therapy as part of management for his recently diagnosed left lung cancer. The patient has a significant smoking history of approximately 1 pack per day since he was 66 years old (50 pack years).  ? ?The patient presented for a lung cancer screening chest CT on 09/01/21 which revealed a new large central left upper lobe pulmonary nodule, with a volume derived mean diameter estimated at 23.1 mm. CT also revealed AP window and left paratracheal lymphadenopathy. Findings were noted as highly concerning for primary lung cancer with nodal metastasis. (Imaging also revealed diffuse bronchial wall thickening with severe centrilobular and paraseptal emphysema, including bullous disease in the upper lobes of the lungs bilaterally (right greater than left). ? ?He was accordingly referred by his PCP to Dr. Roxan Hockey on 09/30/21 for further management. During this visit, the patient denied any chest pain or respiratory symptoms whatsoever. Following dicsussion, Dr. Roxan Hockey recommended further workup via PET/CT prior to proceeding with bronchoscopy.  ? ?Subsequent PET on 10/09/21 showed the  central left upper lobe/hilar mass with intense hypermetabolic ?activity consistent with bronchogenic carcinoma with hypermetabolic AP window and left hilar nodal metastases. No evidence of distant metastatic disease or peripheral lung lesions were appreciated. .  ? ?The patient proceeded with bronchoscopy and nodal biopsies on 10/20/21 under the care of Dr. Roxan Hockey. Pathology showed metastatic small cell carcinoma to 2/4 4L lymph nodes examined. FNA of 4L also revealed metastatic carcinoma. ? ?Dr. Roxan Hockey then referred the patient to Dr. Julien Nordmann on 10/30/21 who recommended an MRI of the brain to rule out any metastatic disease. In terms of treatment options, the patient began systemic chemotherapy consisting of cisplatin and etoposide on 11/10/21. This will be given every 3 weeks for 4 cycles. In addition to tentative radiation to the lung nodule, Dr. Julien Nordmann also recommends prophylactic cranial irradiation if he has no evidence for metastatic disease to the brain. ? ?-- MRI of the brain on 11/08/21 showed no evidence of intracranial metastatic disease. ? ? ?PREVIOUS RADIATION THERAPY: No ? ?PAST MEDICAL HISTORY:  ?Past Medical History:  ?Diagnosis Date  ? Arthritis   ? COPD (chronic obstructive pulmonary disease) ([place])   ? ? ?PAST SURGICAL HISTORY: ?Past Surgical History:  ?Procedure Laterality Date  ? MEDIASTINOSCOPY N/A 10/20/2021  ? Procedure: MEDIASTINOSCOPY;  Surgeon: Melrose Nakayama, MD;  Location: [facility] OR;  Service: Thoracic;  Laterality: N/A;  ? ROTATOR CUFF REPAIR Left   ? VIDEO BRONCHOSCOPY WITH ENDOBRONCHIAL ULTRASOUND N/A 10/20/2021  ? Procedure: VIDEO BRONCHOSCOPY WITH ENDOBRONCHIAL ULTRASOUND WITH BIOPSIES;  Surgeon: Melrose Nakayama, MD;  Location: [facility];  Service: Thoracic;  Laterality: N/A;  ? ? ?FAMILY HISTORY:  ?Family History  ?  Problem Relation Age of Onset  ? Cancer - Lung Father   ? ? ?SOCIAL HISTORY:  ?Social History  ? ?Tobacco Use  ? Smoking status: Former  ?  Packs/day:  1.00  ?  Years: 52.00  ?  Pack years: 52.00  ?  Types: Cigarettes  ?  Quit date: 10/15/2021  ?  Years since quitting: 0.0  ?  Passive exposure: Never  ? Smokeless tobacco: Never  ?Vaping Use  ? Vaping Use: Never used  ?Substance Use Topics  ? Alcohol use: Yes  ?  Alcohol/week: 14.0 standard drinks  ?  Types: 6 Cans of beer, 8 Shots of liquor per week  ? Drug use: Yes  ?  Frequency: 1.0 times per week  ?  Types: Marijuana  ?  Comment: last time 10/11/21  ? ? ?ALLERGIES:  ?Allergies  ?Allergen Reactions  ? Penicillamine Other (See Comments)  ?  Unknown to patient   ? Penicillins   ?  Unknown childhood allergy   ? Leflunomide Diarrhea  ?  Intolerance  ? ? ?MEDICATIONS:  ?Current Outpatient Medications  ?Medication Sig Dispense Refill  ? acetaminophen (TYLENOL) 650 MG CR tablet Take 1,300 mg by mouth daily.    ? albuterol (VENTOLIN HFA) 108 (90 Base) MCG/ACT inhaler Inhale 2 puffs into the lungs every 6 (six) hours as needed for wheezing or shortness of breath.    ? folic acid (FOLVITE) 1 MG tablet Take 1 mg by mouth daily.    ? predniSONE (DELTASONE) 5 MG tablet Take 5 mg by mouth daily as needed (arthritis flare).    ? chlorproMAZINE (THORAZINE) 25 MG tablet Take 1 tablet (25 mg total) by mouth 3 (three) times daily. (Patient not taking: Reported on 11/12/2021) 30 tablet 1  ? fexofenadine-pseudoephedrine (ALLEGRA-D) 60-120 MG 12 hr tablet Take 1 tablet by mouth 2 (two) times daily as needed (allergies). (Patient not taking: Reported on 10/30/2021)    ? methotrexate 50 MG/2ML injection Inject 15 mg into the vein every Wednesday. (Patient not taking: Reported on 11/12/2021)    ? prochlorperazine (COMPAZINE) 10 MG tablet Take 1 tablet (10 mg total) by mouth every 6 (six) hours as needed for nausea or vomiting. (Patient not taking: Reported on 11/12/2021) 30 tablet 0  ? ?No current facility-administered medications for this encounter.  ? ? ?REVIEW OF SYSTEMS:  A 10+ POINT REVIEW OF SYSTEMS WAS OBTAINED including neurology,  dermatology, psychiatry, cardiac, respiratory, lymph, extremities, GI, GU, musculoskeletal, constitutional, reproductive, HEENT.  He reports having some mild hoarseness since his biopsy procedure.  He denies any aspiration problems.  He will be seeing ear nose and throat physician in the near future concerning this issue as well as a left ear problem. ?  ?PHYSICAL EXAM:  weight is 165 lb 8 oz (75.1 kg). His temporal temperature is 97.5 ?F (36.4 ?C) (abnormal). His blood pressure is 136/79 and his pulse is 83. His respiration is 18 and oxygen saturation is 100%.   ?General: Alert and oriented, in no acute distress ?HEENT: Head is normocephalic. Extraocular movements are intact.  ?Neck: Neck is supple, no palpable cervical or supraclavicular lymphadenopathy. ?Heart: Regular in rate and rhythm with no murmurs, rubs, or gallops. ?Chest: Clear to auscultation bilaterally, with no rhonchi, wheezes, or rales. ?Abdomen: Soft, nontender, nondistended, with no rigidity or guarding. ?Extremities: No cyanosis or edema. ?Lymphatics: see Neck Exam ?Skin: No concerning lesions. ?Musculoskeletal: symmetric strength and muscle tone throughout. ?Neurologic: Cranial nerves II through XII are grossly intact. No obvious  focalities. Speech is fluent. Coordination is intact. ?Psychiatric: Judgment and insight are intact. Affect is appropriate. ? ? ?ECOG = 1 ? ?0 - Asymptomatic (Fully active, able to carry on all predisease activities without restriction) ? ?1 - Symptomatic but completely ambulatory (Restricted in physically strenuous activity but ambulatory and able to carry out work of a light or sedentary nature. For example, light housework, office work) ? ?2 - Symptomatic, <50% in bed during the day (Ambulatory and capable of all self care but unable to carry out any work activities. Up and about more than 50% of waking hours) ? ?3 - Symptomatic, >50% in bed, but not bedbound (Capable of only limited self-care, confined to bed or  chair 50% or more of waking hours) ? ?4 - Bedbound (Completely disabled. Cannot carry on any self-care. Totally confined to bed or chair) ? ?5 - Death ? ? Oken MM, Creech RH, Tormey DC, et al. [phone]). "Toxicity a

## 2021-11-11 NOTE — Progress Notes (Incomplete)
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 ?Patient presented with symptoms of: The patient mentioned that he was on the CT screening program for the last 5 years.  He missed last year but the CT screening performed this year on September 01, 2021 showed a large pulmonary nodule in the central aspect of the left upper lobe. The scan also showed multiple enlarged lymph nodes most evident in the lower left paratracheal nodal station measuring 1.6 cm in short axis and AP window nodal station 2.0 cm in short axis ? ?PET Scan ?10/09/2021 ?--IMPRESSION: ?Central left upper lobe/hilar mass with intense hypermetabolic activity consistent with bronchogenic carcinoma with hypermetabolic AP window and left hilar nodal metastases. No evidence of distant metastatic disease. ?No suspicious peripheral lung lesions identified. ?Coronary and aortic atherosclerosis (ICD10-I70.0). Emphysema (ICD10-J43.9). ? ?Biopsies revealed:  ?10/20/2021 ?FINAL MICROSCOPIC DIAGNOSIS:  ?A. LYMPH NODE, 4L, FINE NEEDLE ASPIRATION:  ?- Malignant cells present, see comment  ?COMMENT:  ?The cytomorphology suspicious for small cell carcinoma. There are no malignant cells on the cellblock for confirmatory immunohistochemistry.  ? ?FINAL MICROSCOPIC DIAGNOSIS:  ?A. LYMPH NODE, 4L; EXCISION:  ?-  No carcinoma identified  ?B. LYMPH NODE, 4L # 4; EXCISION:  ?-  Metastatic small cell carcinoma  ?-  See comment  ?C. LYMPH NODE, 4L #2; EXCISION:  ?-  No carcinoma identified  ?D. LYMPH NODE, 4L #3;  EXCISION:  ?-  Metastatic small cell carcinoma  ?-  See comment  ?COMMENT:  ?B and D.  The biopsies show high-grade malignancy admixed with benign appear lymphocytes. By immunohistochemistry, the neoplastic cells are positive for cytokeratin AE1/3, chromogranin (weak), synaptophysin and TTF-1 but negative for Napsin A.  Highlight the background nodal tissue.  ? ?Tobacco/Marijuana/Snuff/ETOH use:  *** ? ?Past/Anticipated interventions by cardiothoracic surgery, if any:  ?10/20/2021 ?--Dr. Modesto Charon ?VIDEO BRONCHOSCOPY WITH ENDOBRONCHIAL ULTRASOUND WITH BIOPSIES ?MEDIASTINOSCOPY ? ?Past/Anticipated interventions by medical oncology, if any:  ?Under care of Dr. Curt Bears ?10/30/2021 ?I recommended for the patient to complete the staging work-up and having MRI of the brain performed to rule out any brain metastasis. ?I explained to the patient that he has a curable condition if there is no evidence of metastatic disease to the brain. ?I recommended for the patient a course of systemic chemotherapy with cisplatin 75 Mg/M2 on day 1 and etoposide 100 Mg/M2 on days 1, 2 and 3 every 3 weeks for 4 cycles followed by prophylactic cranial irradiation if he has no evidence for metastatic disease to the brain. ?He is expected to start the first cycle of the systemic chemotherapy on 11/10/2021. ?I will refer the patient to radiation oncology for discussion of the radiotherapy option. ?The patient will have a chemotherapy education class before the first dose of his treatment ?He will come back for follow-up visit 1 week after his treatment for evaluation and management of any adverse effect of his chemotherapy. ?I strongly advised the patient to continue with the smoking cessation and to cut on the alcohol drinking. ?He was also advised to call immediately if he has any other concerning symptoms in the interval. ? ?Signs/Symptoms ?Weight changes, if any: *** ?Respiratory complaints, if any: *** ?Hemoptysis, if any: *** ?Pain issues, if any:  *** ? ?SAFETY ISSUES: ?Prior radiation? *** ?Pacemaker/ICD? ***  ?Possible current pregnancy? N/A ?Is the patient on methotrexate? *** ? ?Current Complaints / other details:  *** ?MRI Brain w/ & w/o Contrast ?11/08/2021 ?--  IMPRESSION: ?No evidence of metastatic disease in the brain. No acute ?intracranial process. ?

## 2021-11-11 NOTE — Telephone Encounter (Signed)
-----   Message from Daphane Shepherd, RN sent at 11/10/2021  5:09 PM EDT ----- ?Regarding: First time Cisplatin / Etoposide ?Pt of Dr Julien Nordmann, first time cisplatin and etoposide. He did great no signs of distress at the time of discharge.  ? ?

## 2021-11-11 NOTE — Telephone Encounter (Signed)
LM for chemo follow up. To come in today and Wednesday for etoposide. ?

## 2021-11-11 NOTE — Patient Instructions (Signed)
Jonathan Castro  Discharge Instructions: ?Thank you for choosing [facility] to provide your oncology and hematology care.  ? ?If you have a lab appointment with the [facility], please go directly to the [facility] and check in at the registration area. ?  ?Wear comfortable clothing and clothing appropriate for easy access to any Portacath or PICC line.  ? ?We strive to give you quality time with your provider. You may need to reschedule your appointment if you arrive late (15 or more minutes).  Arriving late affects you and other patients whose appointments are after yours.  Also, if you miss three or more appointments without notifying the office, you may be dismissed from the clinic at the provider?s discretion.    ?  ?For prescription refill requests, have your pharmacy contact our office and allow 72 hours for refills to be completed.   ? ?Today you received the following chemotherapy and/or immunotherapy agents Etoposide    ?  ?To help prevent nausea and vomiting after your treatment, we encourage you to take your nausea medication as directed. ? ?BELOW ARE SYMPTOMS THAT SHOULD BE REPORTED IMMEDIATELY: ?*FEVER GREATER THAN 100.4 F (38 ?C) OR HIGHER ?*CHILLS OR SWEATING ?*NAUSEA AND VOMITING THAT IS NOT CONTROLLED WITH YOUR NAUSEA MEDICATION ?*UNUSUAL SHORTNESS OF BREATH ?*UNUSUAL BRUISING OR BLEEDING ?*URINARY PROBLEMS (pain or burning when urinating, or frequent urination) ?*BOWEL PROBLEMS (unusual diarrhea, constipation, pain near the anus) ?TENDERNESS IN MOUTH AND THROAT WITH OR WITHOUT PRESENCE OF ULCERS (sore throat, sores in mouth, or a toothache) ?UNUSUAL RASH, SWELLING OR PAIN  ?UNUSUAL VAGINAL DISCHARGE OR ITCHING  ? ?Items with * indicate a potential emergency and should be followed up as soon as possible or go to the Emergency Department if any problems should occur. ? ?Please show the CHEMOTHERAPY ALERT CARD or IMMUNOTHERAPY ALERT CARD at check-in to  the Emergency Department and triage nurse. ? ?Should you have questions after your visit or need to cancel or reschedule your appointment, please contact [facility]  Dept: [phone]  and follow the prompts.  Office hours are 8:00 a.m. to 4:30 p.m. Monday - Friday. Please note that voicemails left after 4:00 p.m. may not be returned until the following business day.  We are closed weekends and major holidays. You have access to a nurse at all times for urgent questions. Please call the main number to the clinic Dept: [phone] and follow the prompts. ? ? ?For any non-urgent questions, you may also contact your provider using MyChart. We now offer e-Visits for anyone 67 and older to request care online for non-urgent symptoms. For details visit [email or web address]. ?  ?Also download the MyChart app! Go to the app store, search "MyChart", open the app, select [facility], and log in with your MyChart username and password. ? ?Due to Covid, a mask is required upon entering the hospital/clinic. If you do not have a mask, one will be given to you upon arrival. For doctor visits, patients may have 1 support Jonathan Castro aged 67 or older with them. For treatment visits, patients cannot have anyone with them due to current Covid guidelines and our immunocompromised population.  ? ?

## 2021-11-12 ENCOUNTER — Ambulatory Visit
Admission: RE | Admit: 2021-11-12 | Discharge: 2021-11-12 | Disposition: A | Discharge status: Home / Self Care | Payer: PPO | Source: Ambulatory Visit | Attending: Radiation Oncology | Admitting: Radiation Oncology

## 2021-11-12 ENCOUNTER — Encounter: Payer: Self-pay | Admitting: Radiation Oncology

## 2021-11-12 ENCOUNTER — Other Ambulatory Visit: Payer: Self-pay

## 2021-11-12 ENCOUNTER — Other Ambulatory Visit: Payer: Self-pay | Admitting: *Deleted

## 2021-11-12 ENCOUNTER — Inpatient Hospital Stay: Payer: PPO

## 2021-11-12 VITALS — BP 136/79 | HR 83 | Temp 97.5°F | Resp 18 | Ht 67.0 in | Wt 165.8 lb

## 2021-11-12 VITALS — BP 136/79 | HR 83 | Temp 97.5°F | Resp 18 | Wt 165.5 lb

## 2021-11-12 VITALS — BP 143/78 | HR 70 | Temp 97.7°F | Resp 18 | Wt 164.5 lb

## 2021-11-12 DIAGNOSIS — F17210 Nicotine dependence, cigarettes, uncomplicated: Secondary | ICD-10-CM | POA: Insufficient documentation

## 2021-11-12 DIAGNOSIS — C3412 Malignant neoplasm of upper lobe, left bronchus or lung: Secondary | ICD-10-CM

## 2021-11-12 DIAGNOSIS — Z7952 Long term (current) use of systemic steroids: Secondary | ICD-10-CM | POA: Insufficient documentation

## 2021-11-12 DIAGNOSIS — J432 Centrilobular emphysema: Secondary | ICD-10-CM | POA: Insufficient documentation

## 2021-11-12 DIAGNOSIS — Z87891 Personal history of nicotine dependence: Secondary | ICD-10-CM | POA: Diagnosis not present

## 2021-11-12 DIAGNOSIS — Z5111 Encounter for antineoplastic chemotherapy: Secondary | ICD-10-CM | POA: Diagnosis not present

## 2021-11-12 DIAGNOSIS — Z801 Family history of malignant neoplasm of trachea, bronchus and lung: Secondary | ICD-10-CM | POA: Insufficient documentation

## 2021-11-12 MED ORDER — CHLORPROMAZINE HCL 25 MG PO TABS
25.0000 mg | ORAL_TABLET | Freq: Three times a day (TID) | ORAL | 1 refills | Status: DC
Start: 2021-11-12 — End: 2021-12-02

## 2021-11-12 MED ORDER — SODIUM CHLORIDE 0.9 % IV SOLN
10.0000 mg | Freq: Once | INTRAVENOUS | Status: AC
  Administered 2021-11-12: 10 mg via INTRAVENOUS
  Filled 2021-11-12: qty 10

## 2021-11-12 MED ORDER — SODIUM CHLORIDE 0.9 % IV SOLN: Freq: Once | INTRAVENOUS | Status: AC

## 2021-11-12 MED ORDER — SODIUM CHLORIDE 0.9 % IV SOLN
100.0000 mg/m2 | Freq: Once | INTRAVENOUS | Status: AC
  Administered 2021-11-12: 190 mg via INTRAVENOUS
  Filled 2021-11-12: qty 9.5

## 2021-11-12 NOTE — Progress Notes (Addendum)
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 ?Patient presented with symptoms of: The patient mentioned that he was on the CT screening program for the last 5 years.  He missed last year but the CT screening performed this year on September 01, 2021 showed a large pulmonary nodule in the central aspect of the left upper lobe. The scan also showed multiple enlarged lymph nodes most evident in the lower left paratracheal nodal station measuring 1.6 cm in short axis and AP window nodal station 2.0 cm in short axis ? ?PET Scan ?10/09/2021 ?--IMPRESSION: ?Central left upper lobe/hilar mass with intense hypermetabolic activity consistent with bronchogenic carcinoma with hypermetabolic AP window and left hilar nodal metastases. No evidence of distant metastatic disease. ?No suspicious peripheral lung lesions identified. ?Coronary and aortic atherosclerosis (ICD10-I70.0). Emphysema (ICD10-J43.9). ? ?Biopsies revealed:  ?10/20/2021 ?FINAL MICROSCOPIC DIAGNOSIS:  ?A. LYMPH NODE, 4L, FINE NEEDLE ASPIRATION:  ?- Malignant cells present, see comment  ?COMMENT:  ?The cytomorphology suspicious for small cell carcinoma. There are no malignant cells on the cellblock for confirmatory immunohistochemistry.  ? ?FINAL MICROSCOPIC DIAGNOSIS:  ?A. LYMPH NODE, 4L; EXCISION:  ?-  No carcinoma identified  ?B. LYMPH NODE, 4L # 4; EXCISION:  ?-  Metastatic small cell carcinoma  ?-  See comment  ?C. LYMPH NODE, 4L #2; EXCISION:  ?-  No carcinoma identified  ?D. LYMPH NODE, 4L #3;  EXCISION:  ?-  Metastatic small cell carcinoma  ?-  See comment  ?COMMENT:  ?B and D.  The biopsies show high-grade malignancy admixed with benign appear lymphocytes. By immunohistochemistry, the neoplastic cells are positive for cytokeratin AE1/3, chromogranin (weak), synaptophysin and TTF-1 but negative for Napsin A.  Highlight the background nodal tissue.  ? ?Tobacco/Marijuana/Snuff/ETOH use: Quit smoker 27 Days   ago. .States he smokes marjuana ? ?Past/Anticipated interventions by cardiothoracic surgery, if any:  ?10/20/2021 ?--Dr. Modesto Charon ?VIDEO BRONCHOSCOPY WITH ENDOBRONCHIAL ULTRASOUND WITH BIOPSIES ?MEDIASTINOSCOPY ? ?Past/Anticipated interventions by medical oncology, if any:  ?Under care of Dr. Curt Bears ?10/30/2021 ?I recommended for the patient to complete the staging work-up and having MRI of the brain performed to rule out any brain metastasis. ?I explained to the patient that he has a curable condition if there is no evidence of metastatic disease to the brain. ?I recommended for the patient a course of systemic chemotherapy with cisplatin 75 Mg/M2 on day 1 and etoposide 100 Mg/M2 on days 1, 2 and 3 every 3 weeks for 4 cycles followed by prophylactic cranial irradiation if he has no evidence for metastatic disease to the brain. ?He is expected to start the first cycle of the systemic chemotherapy on 11/10/2021. ?I will refer the patient to radiation oncology for discussion of the radiotherapy option. ?The patient will have a chemotherapy education class before the first dose of his treatment ?He will come back for follow-up visit 1 week after his treatment for evaluation and management of any adverse effect of his chemotherapy. ?I strongly advised the patient to continue with the smoking cessation and to cut on the alcohol drinking. ?He was also advised to call immediately if he has any other concerning symptoms in the interval. ? ?Signs/Symptoms ?Weight changes, if any: gained 9 to 10 pounds ?Respiratory complaints, if any: None ?Hemoptysis, if any: no ?Pain issues, if any:   none ? ?SAFETY ISSUES: ?Prior radiation? none ?Pacemaker/ICD? none  ?Possible current pregnancy? N/A ?Is the patient on methotrexate? States that  he was taking it ,but was told to stop during chemotherapy. ? ?Current Complaints / other details:   ?MRI Brain w/ & w/o Contrast ?11/08/2021 ?--IMPRESSION: ?No evidence of  metastatic disease in the brain. No acute ?intracranial process. ?Vitals:  ? 11/12/21 1325  ?BP: 136/79  ?Pulse: 83  ?Resp: 18  ?Temp: (!) 97.5 ?F (36.4 ?C)  ?TempSrc: Temporal  ?SpO2: 100%  ?Weight: 75.1 kg  ? ? ? ?

## 2021-11-14 ENCOUNTER — Other Ambulatory Visit: Payer: Self-pay

## 2021-11-14 ENCOUNTER — Ambulatory Visit
Admission: RE | Admit: 2021-11-14 | Discharge: 2021-11-14 | Disposition: A | Discharge status: Home / Self Care | Payer: PPO | Source: Ambulatory Visit | Attending: Radiation Oncology | Admitting: Radiation Oncology

## 2021-11-14 DIAGNOSIS — C3412 Malignant neoplasm of upper lobe, left bronchus or lung: Secondary | ICD-10-CM | POA: Insufficient documentation

## 2021-11-14 DIAGNOSIS — Z87891 Personal history of nicotine dependence: Secondary | ICD-10-CM | POA: Insufficient documentation

## 2021-11-14 DIAGNOSIS — Z510 Encounter for antineoplastic radiation therapy: Secondary | ICD-10-CM | POA: Insufficient documentation

## 2021-11-14 DIAGNOSIS — Z5111 Encounter for antineoplastic chemotherapy: Secondary | ICD-10-CM | POA: Diagnosis not present

## 2021-11-17 ENCOUNTER — Inpatient Hospital Stay: Payer: PPO

## 2021-11-17 ENCOUNTER — Encounter: Payer: Self-pay | Admitting: Internal Medicine

## 2021-11-17 ENCOUNTER — Inpatient Hospital Stay (HOSPITAL_BASED_OUTPATIENT_CLINIC_OR_DEPARTMENT_OTHER): Payer: PPO | Admitting: Internal Medicine

## 2021-11-17 ENCOUNTER — Other Ambulatory Visit: Payer: Self-pay

## 2021-11-17 VITALS — BP 117/75 | HR 88 | Temp 96.8°F | Resp 16 | Wt 161.6 lb

## 2021-11-17 DIAGNOSIS — C3412 Malignant neoplasm of upper lobe, left bronchus or lung: Secondary | ICD-10-CM

## 2021-11-17 DIAGNOSIS — Z5111 Encounter for antineoplastic chemotherapy: Secondary | ICD-10-CM | POA: Diagnosis not present

## 2021-11-17 LAB — CMP (CANCER CENTER ONLY)
ALT: 24 U/L (ref 0–44)
AST: 18 U/L (ref 15–41)
Albumin: 3.7 g/dL (ref 3.5–5.0)
Alkaline Phosphatase: 60 U/L (ref 38–126)
Anion gap: 4 — ABNORMAL LOW (ref 5–15)
BUN: 18 mg/dL (ref 8–23)
CO2: 31 mmol/L (ref 22–32)
Calcium: 9.1 mg/dL (ref 8.9–10.3)
Chloride: 98 mmol/L (ref 98–111)
Creatinine: 0.97 mg/dL (ref 0.61–1.24)
GFR, Estimated: >60 mL/min (ref >60)
Glucose, Bld: 104 mg/dL — ABNORMAL HIGH (ref 70–99)
Potassium: 3.9 mmol/L (ref 3.5–5.1)
Sodium: 133 mmol/L — ABNORMAL LOW (ref 135–145)
Total Bilirubin: 0.9 mg/dL (ref 0.3–1.2)
Total Protein: 6.4 g/dL — ABNORMAL LOW (ref 6.5–8.1)

## 2021-11-17 LAB — CBC WITH DIFFERENTIAL (CANCER CENTER ONLY)
Abs Immature Granulocytes: 0.11 10*3/uL — ABNORMAL HIGH (ref 0.00–0.07)
Basophils Absolute: 0 10*3/uL (ref 0.0–0.1)
Basophils Relative: 1 %
Eosinophils Absolute: 0 10*3/uL (ref 0.0–0.5)
Eosinophils Relative: 1 %
HCT: 42.2 % (ref 39.0–52.0)
Hemoglobin: 14.9 g/dL (ref 13.0–17.0)
Immature Granulocytes: 2 %
Lymphocytes Relative: 32 %
Lymphs Abs: 1.7 10*3/uL (ref 0.7–4.0)
MCH: 33 pg (ref 26.0–34.0)
MCHC: 35.3 g/dL (ref 30.0–36.0)
MCV: 93.4 fL (ref 80.0–100.0)
Monocytes Absolute: 0 10*3/uL — ABNORMAL LOW (ref 0.1–1.0)
Monocytes Relative: 1 %
Neutro Abs: 3.4 10*3/uL (ref 1.7–7.7)
Neutrophils Relative %: 63 %
Platelet Count: 126 10*3/uL — ABNORMAL LOW (ref 150–400)
RBC: 4.52 MIL/uL (ref 4.22–5.81)
RDW: 12.1 % (ref 11.5–15.5)
Smear Review: NORMAL
WBC Count: 5.3 10*3/uL (ref 4.0–10.5)
nRBC: 0 % (ref 0.0–0.2)

## 2021-11-17 LAB — MAGNESIUM: Magnesium: 1.9 mg/dL (ref 1.7–2.4)

## 2021-11-17 NOTE — Progress Notes (Signed)
[facility] [phone]   [phone]  OFFICE PROGRESS NOTE  [name], MD [address] [place] [address]  DIAGNOSIS: Limited stage (T1c, N2, M0) small cell lung cancer presented with left upper lobe lung mass in addition to left hilar and mediastinal lymphadenopathy diagnosed in April 2023.  PRIOR THERAPY: None  CURRENT THERAPY: Systemic chemotherapy with cisplatin 75 Mg/M2 on day 1 and 2 etoposide 100 Mg/M2 on days 1, 2 and 3 every 3 weeks.  First cycle 11/10/2021.  This is concurrent with radiotherapy.  INTERVAL HISTORY: Jonathan Castro 66 y.o. male returns to the clinic today for follow-up visit.  The patient is feeling fine today with no concerning complaints except for the persistent hoarseness of his voice.  He also has some pain in the right arm from the infusion site of the chemotherapy.  He denied having any current chest pain, shortness of breath, cough or hemoptysis.  He has no nausea, vomiting, diarrhea or constipation.  He has no headache or visual changes.  He tolerated the first week of his treatment with systemic chemotherapy fairly well.  He is expected to start radiotherapy next week.  MEDICAL HISTORY: Past Medical History:  Diagnosis Date   Arthritis    COPD (chronic obstructive pulmonary disease) (HCC)     ALLERGIES:  is allergic to penicillamine, penicillins, and leflunomide.  MEDICATIONS:  Current Outpatient Medications  Medication Sig Dispense Refill   acetaminophen (TYLENOL) 650 MG CR tablet Take 1,300 mg by mouth daily.     chlorproMAZINE (THORAZINE) 25 MG tablet Take 1 tablet (25 mg total) by mouth 3 (three) times daily. 30 tablet 1   folic acid (FOLVITE) 1 MG tablet Take 1 mg by mouth daily.     albuterol (VENTOLIN HFA) 108 (90 Base) MCG/ACT inhaler Inhale 2 puffs into the lungs every 6 (six) hours as needed for wheezing or shortness of breath. (Patient not taking: Reported on 11/17/2021)      fexofenadine-pseudoephedrine (ALLEGRA-D) 60-120 MG 12 hr tablet Take 1 tablet by mouth 2 (two) times daily as needed (allergies). (Patient not taking: Reported on 10/30/2021)     methotrexate 50 MG/2ML injection Inject 15 mg into the vein every Wednesday. (Patient not taking: Reported on 11/17/2021)     predniSONE (DELTASONE) 5 MG tablet Take 5 mg by mouth daily as needed (arthritis flare). (Patient not taking: Reported on 11/17/2021)     prochlorperazine (COMPAZINE) 10 MG tablet Take 1 tablet (10 mg total) by mouth every 6 (six) hours as needed for nausea or vomiting. (Patient not taking: Reported on 11/12/2021) 30 tablet 0   No current facility-administered medications for this visit.    SURGICAL HISTORY:  Past Surgical History:  Procedure Laterality Date   MEDIASTINOSCOPY N/A 10/20/2021   Procedure: MEDIASTINOSCOPY;  Surgeon: Melrose Nakayama, MD;  Location: [facility];  Service: Thoracic;  Laterality: N/A;   ROTATOR CUFF REPAIR Left    VIDEO BRONCHOSCOPY WITH ENDOBRONCHIAL ULTRASOUND N/A 10/20/2021   Procedure: VIDEO BRONCHOSCOPY WITH ENDOBRONCHIAL ULTRASOUND WITH BIOPSIES;  Surgeon: Melrose Nakayama, MD;  Location: [facility];  Service: Thoracic;  Laterality: N/A;    REVIEW OF SYSTEMS:  A comprehensive review of systems was negative except for: Ears, nose, mouth, throat, and face: positive for hoarseness Musculoskeletal: positive for neck pain   PHYSICAL EXAMINATION: General appearance: alert, cooperative, and no distress Head: Normocephalic, without obvious abnormality, atraumatic Neck: no adenopathy, no JVD, supple, symmetrical, trachea midline, and thyroid not  enlarged, symmetric, no tenderness/mass/nodules Lymph nodes: Cervical, supraclavicular, and axillary nodes normal. Resp: clear to auscultation bilaterally Back: symmetric, no curvature. ROM normal. No CVA tenderness. Cardio: regular rate and rhythm, S1, S2 normal, no murmur, click, rub or gallop GI: soft, non-tender; bowel sounds  normal; no masses,  no organomegaly Extremities: extremities normal, atraumatic, no cyanosis or edema  ECOG PERFORMANCE STATUS: 1 - Symptomatic but completely ambulatory  Blood pressure 117/75, pulse 88, temperature (!) 96.8 F (36 C), temperature source Tympanic, resp. rate 16, weight 161 lb 9 oz (73.3 kg), SpO2 99 %.  LABORATORY DATA: Lab Results  Component Value Date   WBC 5.3 11/17/2021   HGB 14.9 11/17/2021   HCT 42.2 11/17/2021   MCV 93.4 11/17/2021   PLT 126 (L) 11/17/2021      Chemistry      Component Value Date/Time   NA 133 (L) 11/17/2021 1008   K 3.9 11/17/2021 1008   CL 98 11/17/2021 1008   CO2 31 11/17/2021 1008   BUN 18 11/17/2021 1008   CREATININE 0.97 11/17/2021 1008      Component Value Date/Time   CALCIUM 9.1 11/17/2021 1008   ALKPHOS 60 11/17/2021 1008   AST 18 11/17/2021 1008   ALT 24 11/17/2021 1008   BILITOT 0.9 11/17/2021 1008       RADIOGRAPHIC STUDIES: MR Brain W Wo Contrast  Result Date: 11/10/2021 CLINICAL DATA:  Metastatic disease evaluation, lung nodule EXAM: MRI HEAD WITHOUT AND WITH CONTRAST TECHNIQUE: Multiplanar, multiecho pulse sequences of the brain and surrounding structures were obtained without and with intravenous contrast. CONTRAST:  8 mL vueway COMPARISON:  None. FINDINGS: Brain: No restricted diffusion to suggest acute or subacute infarct. No acute hemorrhage, mass, mass effect, or midline shift. No hydrocephalus or extra-axial collection. No hemosiderin deposition to suggest remote hemorrhage. Scattered T2 hyperintense signal in the periventricular white matter, likely the sequela of mild chronic small vessel ischemic disease. No abnormal parenchymal or meningeal enhancement. Vascular: Normal arterial flow voids. Venous sinuses are patent on postcontrast imaging. Skull and upper cervical spine: Normal marrow signal. Sinuses/Orbits: Minimal mucosal thickening in the ethmoid air cells. The orbits are unremarkable. Fluid in the  left-greater-than-right mastoid air cells. Other: None. IMPRESSION: No evidence of metastatic disease in the brain. No acute intracranial process. Electronically Signed   By: Merilyn Baba M.D.   On: 11/10/2021 17:07    ASSESSMENT AND PLAN: This is a very pleasant 66 years old white male recently diagnosed with limited stage (T1c, N2, M0) small cell lung cancer presented with left upper lobe lung mass in addition to left hilar and mediastinal lymphadenopathy diagnosed in April 2023. The patient is currently undergoing systemic chemotherapy with cisplatin 75 Mg/M2 on day 1 and oh etoposide 100 Mg/M2 on days 1, 2 and 3 every 3 weeks.  Status post 1 cycle.  He is expected to start concurrent radiotherapy next week. The patient tolerated the first week of his treatment fairly well with no concerning adverse effects. I recommended for him to continue with his treatment plan and he is expected to start cycle #2 in around 2 weeks for now. For the right arm pain this is likely secondary to infiltration of the infusion site and he was advised to apply warm compresses to this area. The patient will come back for follow-up visit in 2 weeks for evaluation before starting cycle #2. He was advised to call immediately if he has any other concerning symptoms in the interval. The patient voices understanding  of current disease status and treatment options and is in agreement with the current care plan.  All questions were answered. The patient knows to call the clinic with any problems, questions or concerns. We can certainly see the patient much sooner if necessary.  The total time spent in the appointment was 20 minutes.  Disclaimer: This note was dictated with voice recognition software. Similar sounding words can inadvertently be transcribed and may not be corrected upon review.

## 2021-11-18 DIAGNOSIS — Z5111 Encounter for antineoplastic chemotherapy: Secondary | ICD-10-CM | POA: Diagnosis not present

## 2021-11-18 DIAGNOSIS — C3412 Malignant neoplasm of upper lobe, left bronchus or lung: Secondary | ICD-10-CM | POA: Diagnosis not present

## 2021-11-18 DIAGNOSIS — Z87891 Personal history of nicotine dependence: Secondary | ICD-10-CM | POA: Diagnosis not present

## 2021-11-19 ENCOUNTER — Encounter: Payer: Self-pay | Admitting: *Deleted

## 2021-11-19 NOTE — Progress Notes (Signed)
Oncology Nurse Navigator Documentation     11/19/2021    1:00 PM 10/31/2021    9:00 AM 10/30/2021   12:00 PM 10/21/2021    7:00 AM  Oncology Nurse Navigator Flowsheets  Abnormal Finding Date   09/02/2021   Confirmed Diagnosis Date   10/20/2021   Diagnosis Status   Confirmed Diagnosis Complete   Planned Course of Treatment Chemo/Radiation Concurrent  Chemo/Radiation Concurrent   Phase of Treatment Radiation     Chemotherapy Actual Start Date: 11/10/2021     Radiation Actual Start Date: 11/25/2021     Navigator Follow Up Date: 12/02/2021 11/03/2021 10/31/2021 10/30/2021  Navigator Follow Up Reason: Follow-up Appointment Appointment Review Appointment Review New Patient Appointment  Navigator Location [facility]  Referral Date to RadOnc/MedOnc    10/21/2021  Navigator Encounter Type Appt/Treatment Plan Review Appt/Treatment Plan Review Clinic/MDC;Initial MedOnc Other:  Treatment Initiated Date 11/10/2021     Patient Visit Type Other Other Initial;MedOnc Other  Treatment Phase Treatment Pre-Tx/Tx Discussion Pre-Tx/Tx Discussion Abnormal Scans  Barriers/Navigation Needs Coordination of Care/I followed up on Mr. Budnick plan of care. He has completed 1st cycle and radiation will begin next week.  He is set up for his plan of care with follow up appts.  Coordination of Care Coordination of Care;Education Coordination of Care  Education   Newly Diagnosed Cancer Education;Other;Understanding Cancer/ Treatment Options   Interventions Coordination of Care Coordination of Care Coordination of Care;Psycho-Social Support;Education Coordination of Care  Acuity Level 2-Minimal Needs (1-2 Barriers Identified) Level 2-Minimal Needs (1-2 Barriers Identified) Level 3-Moderate Needs (3-4 Barriers Identified) Level 2-Minimal Needs (1-2 Barriers Identified)  Coordination of Care Other Other Other Other  Education Method   Verbal;Other   Time Spent with Patient [phone]

## 2021-11-23 ENCOUNTER — Encounter: Payer: Self-pay | Admitting: Internal Medicine

## 2021-11-25 ENCOUNTER — Other Ambulatory Visit: Payer: Self-pay

## 2021-11-25 ENCOUNTER — Other Ambulatory Visit: Payer: Self-pay | Admitting: Pharmacist

## 2021-11-25 ENCOUNTER — Ambulatory Visit
Admission: RE | Admit: 2021-11-25 | Discharge: 2021-11-25 | Disposition: A | Discharge status: Home / Self Care | Payer: PPO | Source: Ambulatory Visit | Attending: Radiation Oncology | Admitting: Radiation Oncology

## 2021-11-25 ENCOUNTER — Inpatient Hospital Stay: Payer: PPO

## 2021-11-25 ENCOUNTER — Telehealth: Payer: Self-pay | Admitting: *Deleted

## 2021-11-25 ENCOUNTER — Telehealth: Payer: Self-pay | Admitting: Medical Oncology

## 2021-11-25 VITALS — BP 126/68 | HR 65 | Temp 97.7°F | Resp 17

## 2021-11-25 DIAGNOSIS — C3412 Malignant neoplasm of upper lobe, left bronchus or lung: Secondary | ICD-10-CM | POA: Diagnosis not present

## 2021-11-25 DIAGNOSIS — Z5111 Encounter for antineoplastic chemotherapy: Secondary | ICD-10-CM | POA: Diagnosis not present

## 2021-11-25 DIAGNOSIS — Z87891 Personal history of nicotine dependence: Secondary | ICD-10-CM | POA: Diagnosis not present

## 2021-11-25 DIAGNOSIS — Z510 Encounter for antineoplastic radiation therapy: Secondary | ICD-10-CM | POA: Diagnosis not present

## 2021-11-25 LAB — CBC WITH DIFFERENTIAL (CANCER CENTER ONLY)
Abs Immature Granulocytes: 0.01 10*3/uL (ref 0.00–0.07)
Basophils Absolute: 0 10*3/uL (ref 0.0–0.1)
Basophils Relative: 0 %
Eosinophils Absolute: 0.1 10*3/uL (ref 0.0–0.5)
Eosinophils Relative: 5 %
HCT: 39.9 % (ref 39.0–52.0)
Hemoglobin: 14.2 g/dL (ref 13.0–17.0)
Immature Granulocytes: 1 %
Lymphocytes Relative: 66 %
Lymphs Abs: 1.2 10*3/uL (ref 0.7–4.0)
MCH: 33 pg (ref 26.0–34.0)
MCHC: 35.6 g/dL (ref 30.0–36.0)
MCV: 92.8 fL (ref 80.0–100.0)
Monocytes Absolute: 0.4 10*3/uL (ref 0.1–1.0)
Monocytes Relative: 24 %
Neutro Abs: 0.1 10*3/uL — CL (ref 1.7–7.7)
Neutrophils Relative %: 4 %
Platelet Count: 230 10*3/uL (ref 150–400)
RBC: 4.3 MIL/uL (ref 4.22–5.81)
RDW: 12.5 % (ref 11.5–15.5)
Smear Review: NORMAL
WBC Count: 1.7 10*3/uL — ABNORMAL LOW (ref 4.0–10.5)
nRBC: 0 % (ref 0.0–0.2)

## 2021-11-25 LAB — RAD ONC ARIA SESSION SUMMARY
Course Elapsed Days: 0
Plan Fractions Treated to Date: 1
Plan Prescribed Dose Per Fraction: 2 Gy
Plan Total Fractions Prescribed: 30
Plan Total Prescribed Dose: 60 Gy
Reference Point Dosage Given to Date: 2 Gy
Reference Point Session Dosage Given: 2 Gy
Session Number: 1

## 2021-11-25 LAB — CMP (CANCER CENTER ONLY)
ALT: 107 U/L — ABNORMAL HIGH (ref 0–44)
AST: 36 U/L (ref 15–41)
Albumin: 3.9 g/dL (ref 3.5–5.0)
Alkaline Phosphatase: 106 U/L (ref 38–126)
Anion gap: 4 — ABNORMAL LOW (ref 5–15)
BUN: 13 mg/dL (ref 8–23)
CO2: 29 mmol/L (ref 22–32)
Calcium: 9.4 mg/dL (ref 8.9–10.3)
Chloride: 100 mmol/L (ref 98–111)
Creatinine: 0.91 mg/dL (ref 0.61–1.24)
GFR, Estimated: >60 mL/min (ref >60)
Glucose, Bld: 106 mg/dL — ABNORMAL HIGH (ref 70–99)
Potassium: 3.9 mmol/L (ref 3.5–5.1)
Sodium: 133 mmol/L — ABNORMAL LOW (ref 135–145)
Total Bilirubin: 0.5 mg/dL (ref 0.3–1.2)
Total Protein: 6.6 g/dL (ref 6.5–8.1)

## 2021-11-25 MED ORDER — FILGRASTIM-SNDZ 300 MCG/0.5ML IJ SOSY
300.0000 ug | PREFILLED_SYRINGE | Freq: Once | INTRAMUSCULAR | Status: AC
  Administered 2021-11-25: 300 ug via SUBCUTANEOUS
  Filled 2021-11-25: qty 0.5

## 2021-11-25 NOTE — Telephone Encounter (Signed)
CRITICAL VALUE STICKER  CRITICAL VALUE: ANC 0.1  RECEIVER (on-site recipient of call): Shelle Iron, RN  DATE & TIME NOTIFIED: 11/25/2021 @ 11:42  MESSENGER (representative from lab): Valerie Salts  MD NOTIFIED: Julien Nordmann & his nurse  TIME OF NOTIFICATION: 11:43am  RESPONSE: aware.

## 2021-11-25 NOTE — Telephone Encounter (Signed)
Neutropenia - pt notified of immune compromised state and is coming back for injection today . Schedule message sent.

## 2021-11-26 ENCOUNTER — Ambulatory Visit
Admission: RE | Admit: 2021-11-26 | Discharge: 2021-11-26 | Disposition: A | Discharge status: Home / Self Care | Payer: PPO | Source: Ambulatory Visit | Attending: Radiation Oncology | Admitting: Radiation Oncology

## 2021-11-26 ENCOUNTER — Inpatient Hospital Stay: Payer: PPO

## 2021-11-26 ENCOUNTER — Other Ambulatory Visit: Payer: Self-pay

## 2021-11-26 VITALS — BP 120/75 | HR 71 | Temp 97.7°F | Resp 18

## 2021-11-26 DIAGNOSIS — C3412 Malignant neoplasm of upper lobe, left bronchus or lung: Secondary | ICD-10-CM

## 2021-11-26 DIAGNOSIS — Z87891 Personal history of nicotine dependence: Secondary | ICD-10-CM | POA: Diagnosis not present

## 2021-11-26 DIAGNOSIS — Z923 Personal history of irradiation: Secondary | ICD-10-CM | POA: Diagnosis not present

## 2021-11-26 DIAGNOSIS — Z510 Encounter for antineoplastic radiation therapy: Secondary | ICD-10-CM | POA: Diagnosis not present

## 2021-11-26 DIAGNOSIS — C3490 Malignant neoplasm of unspecified part of unspecified bronchus or lung: Secondary | ICD-10-CM | POA: Diagnosis not present

## 2021-11-26 DIAGNOSIS — Z5111 Encounter for antineoplastic chemotherapy: Secondary | ICD-10-CM | POA: Diagnosis not present

## 2021-11-26 DIAGNOSIS — J3801 Paralysis of vocal cords and larynx, unilateral: Secondary | ICD-10-CM | POA: Diagnosis not present

## 2021-11-26 DIAGNOSIS — Z9221 Personal history of antineoplastic chemotherapy: Secondary | ICD-10-CM | POA: Diagnosis not present

## 2021-11-26 LAB — RAD ONC ARIA SESSION SUMMARY
Course Elapsed Days: 1
Plan Fractions Treated to Date: 2
Plan Prescribed Dose Per Fraction: 2 Gy
Plan Total Fractions Prescribed: 30
Plan Total Prescribed Dose: 60 Gy
Reference Point Dosage Given to Date: 4 Gy
Reference Point Session Dosage Given: 2 Gy
Session Number: 2

## 2021-11-26 MED ORDER — FILGRASTIM-SNDZ 300 MCG/0.5ML IJ SOSY
300.0000 ug | PREFILLED_SYRINGE | Freq: Once | INTRAMUSCULAR | Status: AC
  Administered 2021-11-26: 300 ug via SUBCUTANEOUS
  Filled 2021-11-26: qty 0.5

## 2021-11-27 ENCOUNTER — Other Ambulatory Visit: Payer: Self-pay

## 2021-11-27 ENCOUNTER — Other Ambulatory Visit: Payer: Self-pay | Admitting: Internal Medicine

## 2021-11-27 ENCOUNTER — Inpatient Hospital Stay: Payer: PPO

## 2021-11-27 ENCOUNTER — Ambulatory Visit: Payer: PPO

## 2021-11-27 ENCOUNTER — Ambulatory Visit
Admission: RE | Admit: 2021-11-27 | Discharge: 2021-11-27 | Disposition: A | Discharge status: Home / Self Care | Payer: PPO | Source: Ambulatory Visit | Attending: Radiation Oncology | Admitting: Radiation Oncology

## 2021-11-27 VITALS — BP 129/71 | HR 87 | Temp 97.7°F | Resp 18

## 2021-11-27 DIAGNOSIS — C3412 Malignant neoplasm of upper lobe, left bronchus or lung: Secondary | ICD-10-CM | POA: Insufficient documentation

## 2021-11-27 DIAGNOSIS — Z510 Encounter for antineoplastic radiation therapy: Secondary | ICD-10-CM | POA: Insufficient documentation

## 2021-11-27 DIAGNOSIS — Z5111 Encounter for antineoplastic chemotherapy: Secondary | ICD-10-CM | POA: Insufficient documentation

## 2021-11-27 DIAGNOSIS — D701 Agranulocytosis secondary to cancer chemotherapy: Secondary | ICD-10-CM | POA: Insufficient documentation

## 2021-11-27 DIAGNOSIS — Z5189 Encounter for other specified aftercare: Secondary | ICD-10-CM | POA: Insufficient documentation

## 2021-11-27 DIAGNOSIS — R490 Dysphonia: Secondary | ICD-10-CM | POA: Insufficient documentation

## 2021-11-27 DIAGNOSIS — Z87891 Personal history of nicotine dependence: Secondary | ICD-10-CM | POA: Diagnosis not present

## 2021-11-27 DIAGNOSIS — J449 Chronic obstructive pulmonary disease, unspecified: Secondary | ICD-10-CM | POA: Insufficient documentation

## 2021-11-27 DIAGNOSIS — R1310 Dysphagia, unspecified: Secondary | ICD-10-CM | POA: Insufficient documentation

## 2021-11-27 LAB — RAD ONC ARIA SESSION SUMMARY
Course Elapsed Days: 2
Plan Fractions Treated to Date: 3
Plan Prescribed Dose Per Fraction: 2 Gy
Plan Total Fractions Prescribed: 30
Plan Total Prescribed Dose: 60 Gy
Reference Point Dosage Given to Date: 6 Gy
Reference Point Session Dosage Given: 2 Gy
Session Number: 3

## 2021-11-27 MED ORDER — OXYCODONE-ACETAMINOPHEN 5-325 MG PO TABS: 1.0000 | ORAL_TABLET | Freq: Three times a day (TID) | ORAL | 0 refills | Status: DC | PRN

## 2021-11-27 MED ORDER — FILGRASTIM-SNDZ 300 MCG/0.5ML IJ SOSY
300.0000 ug | PREFILLED_SYRINGE | Freq: Once | INTRAMUSCULAR | Status: AC
  Administered 2021-11-27: 300 ug via SUBCUTANEOUS
  Filled 2021-11-27: qty 0.5

## 2021-11-28 ENCOUNTER — Ambulatory Visit
Admission: RE | Admit: 2021-11-28 | Discharge: 2021-11-28 | Disposition: A | Discharge status: Home / Self Care | Payer: PPO | Source: Ambulatory Visit | Attending: Radiation Oncology | Admitting: Radiation Oncology

## 2021-11-28 ENCOUNTER — Other Ambulatory Visit: Payer: Self-pay

## 2021-11-28 DIAGNOSIS — C3412 Malignant neoplasm of upper lobe, left bronchus or lung: Secondary | ICD-10-CM | POA: Diagnosis not present

## 2021-11-28 DIAGNOSIS — Z510 Encounter for antineoplastic radiation therapy: Secondary | ICD-10-CM | POA: Diagnosis not present

## 2021-11-28 DIAGNOSIS — Z87891 Personal history of nicotine dependence: Secondary | ICD-10-CM | POA: Diagnosis not present

## 2021-11-28 DIAGNOSIS — Z5111 Encounter for antineoplastic chemotherapy: Secondary | ICD-10-CM | POA: Diagnosis not present

## 2021-11-28 LAB — RAD ONC ARIA SESSION SUMMARY
Course Elapsed Days: 3
Plan Fractions Treated to Date: 4
Plan Prescribed Dose Per Fraction: 2 Gy
Plan Total Fractions Prescribed: 30
Plan Total Prescribed Dose: 60 Gy
Reference Point Dosage Given to Date: 8 Gy
Reference Point Session Dosage Given: 2 Gy
Session Number: 4

## 2021-12-01 ENCOUNTER — Other Ambulatory Visit: Payer: Self-pay

## 2021-12-01 ENCOUNTER — Ambulatory Visit: Payer: PPO

## 2021-12-01 ENCOUNTER — Ambulatory Visit
Admission: RE | Admit: 2021-12-01 | Discharge: 2021-12-01 | Disposition: A | Discharge status: Home / Self Care | Payer: PPO | Source: Ambulatory Visit | Attending: Radiation Oncology | Admitting: Radiation Oncology

## 2021-12-01 DIAGNOSIS — C3412 Malignant neoplasm of upper lobe, left bronchus or lung: Secondary | ICD-10-CM | POA: Diagnosis not present

## 2021-12-01 DIAGNOSIS — Z510 Encounter for antineoplastic radiation therapy: Secondary | ICD-10-CM | POA: Diagnosis not present

## 2021-12-01 DIAGNOSIS — Z5111 Encounter for antineoplastic chemotherapy: Secondary | ICD-10-CM | POA: Diagnosis not present

## 2021-12-01 DIAGNOSIS — Z87891 Personal history of nicotine dependence: Secondary | ICD-10-CM | POA: Diagnosis not present

## 2021-12-01 LAB — RAD ONC ARIA SESSION SUMMARY
Course Elapsed Days: 6
Plan Fractions Treated to Date: 5
Plan Prescribed Dose Per Fraction: 2 Gy
Plan Total Fractions Prescribed: 30
Plan Total Prescribed Dose: 60 Gy
Reference Point Dosage Given to Date: 10 Gy
Reference Point Session Dosage Given: 2 Gy
Session Number: 5

## 2021-12-01 MED ORDER — SONAFINE EX EMUL
1.0000 "application " | Freq: Once | CUTANEOUS | Status: AC
  Administered 2021-12-01: 1 via TOPICAL

## 2021-12-01 MED FILL — Dexamethasone Sodium Phosphate Inj 100 MG/10ML: INTRAMUSCULAR | Qty: 1 | Status: AC

## 2021-12-01 MED FILL — Fosaprepitant Dimeglumine For IV Infusion 150 MG (Base Eq): INTRAVENOUS | Qty: 5 | Status: AC

## 2021-12-01 NOTE — Progress Notes (Signed)
Pt here for patient teaching.    Pt given Radiation and You booklet, skin care instructions, and Sonafine.    Reviewed areas of pertinence such as fatigue, hair loss in treatment field, skin changes, throat changes, cough, and shortness of breath .   Pt able to give teach back of use unscented/gentle soap and drink plenty of water,apply Sonafine bid and avoid applying anything to skin within 4 hours of treatment.   Pt verbalizes understanding of information given and will contact nursing with any questions or concerns.

## 2021-12-02 ENCOUNTER — Encounter: Payer: Self-pay | Admitting: *Deleted

## 2021-12-02 ENCOUNTER — Encounter: Payer: Self-pay | Admitting: Internal Medicine

## 2021-12-02 ENCOUNTER — Inpatient Hospital Stay: Payer: PPO

## 2021-12-02 ENCOUNTER — Inpatient Hospital Stay (HOSPITAL_BASED_OUTPATIENT_CLINIC_OR_DEPARTMENT_OTHER): Payer: PPO | Admitting: Internal Medicine

## 2021-12-02 ENCOUNTER — Ambulatory Visit
Admission: RE | Admit: 2021-12-02 | Discharge: 2021-12-02 | Disposition: A | Discharge status: Home / Self Care | Payer: PPO | Source: Ambulatory Visit | Attending: Radiation Oncology | Admitting: Radiation Oncology

## 2021-12-02 ENCOUNTER — Other Ambulatory Visit: Payer: Self-pay

## 2021-12-02 VITALS — BP 123/75 | HR 63 | Temp 97.9°F | Resp 18 | Ht 67.0 in | Wt 161.1 lb

## 2021-12-02 DIAGNOSIS — C3412 Malignant neoplasm of upper lobe, left bronchus or lung: Secondary | ICD-10-CM

## 2021-12-02 DIAGNOSIS — Z5111 Encounter for antineoplastic chemotherapy: Secondary | ICD-10-CM

## 2021-12-02 DIAGNOSIS — Z510 Encounter for antineoplastic radiation therapy: Secondary | ICD-10-CM | POA: Diagnosis not present

## 2021-12-02 DIAGNOSIS — Z87891 Personal history of nicotine dependence: Secondary | ICD-10-CM | POA: Diagnosis not present

## 2021-12-02 LAB — CMP (CANCER CENTER ONLY)
ALT: 24 U/L (ref 0–44)
AST: 17 U/L (ref 15–41)
Albumin: 3.9 g/dL (ref 3.5–5.0)
Alkaline Phosphatase: 88 U/L (ref 38–126)
Anion gap: 7 (ref 5–15)
BUN: 16 mg/dL (ref 8–23)
CO2: 25 mmol/L (ref 22–32)
Calcium: 9 mg/dL (ref 8.9–10.3)
Chloride: 102 mmol/L (ref 98–111)
Creatinine: 0.83 mg/dL (ref 0.61–1.24)
GFR, Estimated: >60 mL/min (ref >60)
Glucose, Bld: 97 mg/dL (ref 70–99)
Potassium: 4.1 mmol/L (ref 3.5–5.1)
Sodium: 134 mmol/L — ABNORMAL LOW (ref 135–145)
Total Bilirubin: 0.4 mg/dL (ref 0.3–1.2)
Total Protein: 7 g/dL (ref 6.5–8.1)

## 2021-12-02 LAB — RAD ONC ARIA SESSION SUMMARY
Course Elapsed Days: 7
Plan Fractions Treated to Date: 6
Plan Prescribed Dose Per Fraction: 2 Gy
Plan Total Fractions Prescribed: 30
Plan Total Prescribed Dose: 60 Gy
Reference Point Dosage Given to Date: 12 Gy
Reference Point Session Dosage Given: 2 Gy
Session Number: 6

## 2021-12-02 LAB — CBC WITH DIFFERENTIAL (CANCER CENTER ONLY)
Abs Immature Granulocytes: 1.55 10*3/uL — ABNORMAL HIGH (ref 0.00–0.07)
Basophils Absolute: 0.4 10*3/uL — ABNORMAL HIGH (ref 0.0–0.1)
Basophils Relative: 4 %
Eosinophils Absolute: 0 10*3/uL (ref 0.0–0.5)
Eosinophils Relative: 0 %
HCT: 43 % (ref 39.0–52.0)
Hemoglobin: 15.1 g/dL (ref 13.0–17.0)
Immature Granulocytes: 15 %
Lymphocytes Relative: 28 %
Lymphs Abs: 2.9 10*3/uL (ref 0.7–4.0)
MCH: 32.5 pg (ref 26.0–34.0)
MCHC: 35.1 g/dL (ref 30.0–36.0)
MCV: 92.7 fL (ref 80.0–100.0)
Monocytes Absolute: 1.7 10*3/uL — ABNORMAL HIGH (ref 0.1–1.0)
Monocytes Relative: 16 %
Neutro Abs: 3.7 10*3/uL (ref 1.7–7.7)
Neutrophils Relative %: 37 %
Platelet Count: 333 10*3/uL (ref 150–400)
RBC: 4.64 MIL/uL (ref 4.22–5.81)
RDW: 12.8 % (ref 11.5–15.5)
WBC Count: 10.2 10*3/uL (ref 4.0–10.5)
nRBC: 0.2 % (ref 0.0–0.2)

## 2021-12-02 LAB — MAGNESIUM: Magnesium: 2.1 mg/dL (ref 1.7–2.4)

## 2021-12-02 MED ORDER — MAGNESIUM SULFATE 2 GM/50ML IV SOLN
2.0000 g | Freq: Once | INTRAVENOUS | Status: AC
  Administered 2021-12-02: 2 g via INTRAVENOUS
  Filled 2021-12-02: qty 50

## 2021-12-02 MED ORDER — POTASSIUM CHLORIDE IN NACL 20-0.9 MEQ/L-% IV SOLN
Freq: Once | INTRAVENOUS | Status: AC
  Filled 2021-12-02: qty 1000

## 2021-12-02 MED ORDER — SODIUM CHLORIDE 0.9 % IV SOLN
75.0000 mg/m2 | Freq: Once | INTRAVENOUS | Status: AC
  Administered 2021-12-02: 139 mg via INTRAVENOUS
  Filled 2021-12-02: qty 139

## 2021-12-02 MED ORDER — SODIUM CHLORIDE 0.9 % IV SOLN
150.0000 mg | Freq: Once | INTRAVENOUS | Status: AC
  Administered 2021-12-02: 150 mg via INTRAVENOUS
  Filled 2021-12-02: qty 150

## 2021-12-02 MED ORDER — SODIUM CHLORIDE 0.9 % IV SOLN: Freq: Once | INTRAVENOUS | Status: AC

## 2021-12-02 MED ORDER — PALONOSETRON HCL INJECTION 0.25 MG/5ML
0.2500 mg | Freq: Once | INTRAVENOUS | Status: AC
  Administered 2021-12-02: 0.25 mg via INTRAVENOUS
  Filled 2021-12-02: qty 5

## 2021-12-02 MED ORDER — SODIUM CHLORIDE 0.9 % IV SOLN
10.0000 mg | Freq: Once | INTRAVENOUS | Status: AC
  Administered 2021-12-02: 10 mg via INTRAVENOUS
  Filled 2021-12-02: qty 10

## 2021-12-02 MED ORDER — SODIUM CHLORIDE 0.9 % IV SOLN
100.0000 mg/m2 | Freq: Once | INTRAVENOUS | Status: AC
  Administered 2021-12-02: 190 mg via INTRAVENOUS
  Filled 2021-12-02: qty 9.5

## 2021-12-02 MED FILL — Dexamethasone Sodium Phosphate Inj 100 MG/10ML: INTRAMUSCULAR | Qty: 1 | Status: AC

## 2021-12-02 NOTE — Progress Notes (Signed)
Oncology Nurse Navigator Documentation     12/02/2021    9:00 AM 11/19/2021    1:00 PM 10/31/2021    9:00 AM 10/30/2021   12:00 PM 10/21/2021    7:00 AM  Oncology Nurse Navigator Flowsheets  Abnormal Finding Date    09/02/2021   Confirmed Diagnosis Date    10/20/2021   Diagnosis Status    Confirmed Diagnosis Complete   Planned Course of Treatment  Chemo/Radiation Concurrent  Chemo/Radiation Concurrent   Phase of Treatment  Radiation     Chemotherapy Actual Start Date:  11/10/2021     Radiation Actual Start Date:  11/25/2021     Navigator Follow Up Date:  12/02/2021 11/03/2021 10/31/2021 10/30/2021  Navigator Follow Up Reason:  Follow-up Appointment Appointment Review Appointment Review New Patient Appointment  Navigator Location [facility]  Referral Date to RadOnc/MedOnc     10/21/2021  Navigator Encounter Type Clinic/MDC Appt/Treatment Plan Review Appt/Treatment Plan Review Clinic/MDC;Initial MedOnc Other:  Treatment Initiated Date  11/10/2021     Patient Visit Type MedOnc;Follow-up Other Other Initial;MedOnc Other  Treatment Phase Treatment Treatment Pre-Tx/Tx Discussion Pre-Tx/Tx Discussion Abnormal Scans  Barriers/Navigation Needs  Coordination of Care Coordination of Care Coordination of Care;Education Coordination of Care  Education    Newly Diagnosed Cancer Education;Other;Understanding Cancer/ Treatment Options   Interventions Psycho-Social Support/spoke with Jonathan Castro today at his follow up appt with Dr. Julien Nordmann. No navigation barriers identified offered support and encouragement.  Coordination of Care Coordination of Care Coordination of Care;Psycho-Social Support;Education Coordination of Care  Acuity Level 2-Minimal Needs (1-2 Barriers Identified) Level 2-Minimal Needs (1-2 Barriers Identified) Level 2-Minimal Needs (1-2 Barriers Identified) Level 3-Moderate Needs (3-4 Barriers Identified) Level 2-Minimal Needs (1-2 Barriers  Identified)  Coordination of Care  Other Other Other Other  Education Method    Verbal;Other   Time Spent with Patient 15 30 15  45 30

## 2021-12-02 NOTE — Progress Notes (Signed)
[name] [phone]   Fax:([phone]  OFFICE PROGRESS NOTE  Jonathan Noble, MD [address] [place] [address]  DIAGNOSIS: Limited stage (T1c, N2, M0) small cell lung cancer presented with left upper lobe lung mass in addition to left hilar and mediastinal lymphadenopathy diagnosed in April 2023.  PRIOR THERAPY: None  CURRENT THERAPY: Systemic chemotherapy with cisplatin 75 Mg/M2 on day 1 and 2 etoposide 100 Mg/M2 on days 1, 2 and 3 every 3 weeks.  First cycle 11/10/2021.  This is concurrent with radiotherapy.  Status post 1 cycle.  INTERVAL HISTORY: Jonathan Castro 66 y.o. male returns to the clinic today for follow-up visit.  The patient is feeling fine today with no concerning complaints.  He had significant pain after his filgrastim injection for the neutropenia.  He denied having any current chest pain, shortness of breath, cough or hemoptysis.  He denied having any fever or chills.  He has no nausea, vomiting, diarrhea or constipation.  He has no headache or visual changes.  He is here today for evaluation before starting cycle #2.  MEDICAL HISTORY: Past Medical History:  Diagnosis Date   Arthritis    COPD (chronic obstructive pulmonary disease) (HCC)     ALLERGIES:  is allergic to penicillamine, penicillins, and leflunomide.  MEDICATIONS:  Current Outpatient Medications  Medication Sig Dispense Refill   acetaminophen (TYLENOL) 650 MG CR tablet Take 1,300 mg by mouth daily.     albuterol (VENTOLIN HFA) 108 (90 Base) MCG/ACT inhaler Inhale 2 puffs into the lungs every 6 (six) hours as needed for wheezing or shortness of breath. (Patient not taking: Reported on 11/17/2021)     chlorproMAZINE (THORAZINE) 25 MG tablet Take 1 tablet (25 mg total) by mouth 3 (three) times daily. 30 tablet 1   fexofenadine-pseudoephedrine (ALLEGRA-D) 60-120 MG 12 hr tablet Take 1 tablet by mouth 2 (two) times daily as needed (allergies). (Patient not  taking: Reported on 11/28/9507)     folic acid (FOLVITE) 1 MG tablet Take 1 mg by mouth daily.     methotrexate 50 MG/2ML injection Inject 15 mg into the vein every Wednesday. (Patient not taking: Reported on 11/17/2021)     oxyCODONE-acetaminophen (PERCOCET/ROXICET) 5-325 MG tablet Take 1 tablet by mouth every 8 (eight) hours as needed for severe pain. 30 tablet 0   predniSONE (DELTASONE) 5 MG tablet Take 5 mg by mouth daily as needed (arthritis flare). (Patient not taking: Reported on 11/17/2021)     prochlorperazine (COMPAZINE) 10 MG tablet Take 1 tablet (10 mg total) by mouth every 6 (six) hours as needed for nausea or vomiting. (Patient not taking: Reported on 11/12/2021) 30 tablet 0   No current facility-administered medications for this visit.    SURGICAL HISTORY:  Past Surgical History:  Procedure Laterality Date   MEDIASTINOSCOPY N/A 10/20/2021   Procedure: MEDIASTINOSCOPY;  Surgeon: Melrose Nakayama, MD;  Location: [facility] OR;  Service: Thoracic;  Laterality: N/A;   ROTATOR CUFF REPAIR Left    VIDEO BRONCHOSCOPY WITH ENDOBRONCHIAL ULTRASOUND N/A 10/20/2021   Procedure: VIDEO BRONCHOSCOPY WITH ENDOBRONCHIAL ULTRASOUND WITH BIOPSIES;  Surgeon: Melrose Nakayama, MD;  Location: [place] [facility];  Service: Thoracic;  Laterality: N/A;    REVIEW OF SYSTEMS:  A comprehensive review of systems was negative.   PHYSICAL EXAMINATION: General appearance: alert, cooperative, and no distress Head: Normocephalic, without obvious abnormality, atraumatic Neck: no adenopathy, no JVD, supple, symmetrical, trachea midline, and thyroid not enlarged, symmetric, no tenderness/mass/nodules  Lymph nodes: Cervical, supraclavicular, and axillary nodes normal. Resp: clear to auscultation bilaterally Back: symmetric, no curvature. ROM normal. No CVA tenderness. Cardio: regular rate and rhythm, S1, S2 normal, no murmur, click, rub or gallop GI: soft, non-tender; bowel sounds normal; no masses,  no  organomegaly Extremities: extremities normal, atraumatic, no cyanosis or edema  ECOG PERFORMANCE STATUS: 1 - Symptomatic but completely ambulatory  Blood pressure 123/75, pulse 63, temperature 97.9 F (36.6 C), temperature source Oral, resp. rate 18, height 5\' 7"  (1.702 m), weight 161 lb 1.6 oz (73.1 kg), SpO2 98 %.  LABORATORY DATA: Lab Results  Component Value Date   WBC 10.2 12/02/2021   HGB 15.1 12/02/2021   HCT 43.0 12/02/2021   MCV 92.7 12/02/2021   PLT 333 12/02/2021      Chemistry      Component Value Date/Time   NA 134 (L) 12/02/2021 0747   K 4.1 12/02/2021 0747   CL 102 12/02/2021 0747   CO2 25 12/02/2021 0747   BUN 16 12/02/2021 0747   CREATININE 0.83 12/02/2021 0747      Component Value Date/Time   CALCIUM 9.0 12/02/2021 0747   ALKPHOS 88 12/02/2021 0747   AST 17 12/02/2021 0747   ALT 24 12/02/2021 0747   BILITOT 0.4 12/02/2021 0747       RADIOGRAPHIC STUDIES: MR Brain W Wo Contrast  Result Date: 11/10/2021 CLINICAL DATA:  Metastatic disease evaluation, lung nodule EXAM: MRI HEAD WITHOUT AND WITH CONTRAST TECHNIQUE: Multiplanar, multiecho pulse sequences of the brain and surrounding structures were obtained without and with intravenous contrast. CONTRAST:  8 mL vueway COMPARISON:  None. FINDINGS: Brain: No restricted diffusion to suggest acute or subacute infarct. No acute hemorrhage, mass, mass effect, or midline shift. No hydrocephalus or extra-axial collection. No hemosiderin deposition to suggest remote hemorrhage. Scattered T2 hyperintense signal in the periventricular white matter, likely the sequela of mild chronic small vessel ischemic disease. No abnormal parenchymal or meningeal enhancement. Vascular: Normal arterial flow voids. Venous sinuses are patent on postcontrast imaging. Skull and upper cervical spine: Normal marrow signal. Sinuses/Orbits: Minimal mucosal thickening in the ethmoid air cells. The orbits are unremarkable. Fluid in the  left-greater-than-right mastoid air cells. Other: None. IMPRESSION: No evidence of metastatic disease in the brain. No acute intracranial process. Electronically Signed   By: Merilyn Baba M.D.   On: 11/10/2021 17:07    ASSESSMENT AND PLAN: This is a very pleasant 66 years old white male recently diagnosed with limited stage (T1c, N2, M0) small cell lung cancer presented with left upper lobe lung mass in addition to left hilar and mediastinal lymphadenopathy diagnosed in April 2023. The patient is currently undergoing systemic chemotherapy with cisplatin 75 Mg/M2 on day 1 and oh etoposide 100 Mg/M2 on days 1, 2 and 3 every 3 weeks.  Status post 1 cycle.   The patient tolerated the first cycle of his treatment fairly well except for the aching pain and fatigue after the filgrastim injection for the chemotherapy-induced neutropenia. He is feeling much better today and ready to start cycle #2. I recommended for the patient to proceed with the treatment as planned. I will see him back for follow-up visit in 3 weeks for evaluation before the next cycle of his treatment. The patient voices understanding of current disease status and treatment options and is in agreement with the current care plan.  All questions were answered. The patient knows to call the clinic with any problems, questions or concerns. We can certainly see  the patient much sooner if necessary.  The total time spent in the appointment was 20 minutes.  Disclaimer: This note was dictated with voice recognition software. Similar sounding words can inadvertently be transcribed and may not be corrected upon review.

## 2021-12-03 ENCOUNTER — Ambulatory Visit
Admission: RE | Admit: 2021-12-03 | Discharge: 2021-12-03 | Disposition: A | Discharge status: Home / Self Care | Payer: PPO | Source: Ambulatory Visit | Attending: Radiation Oncology | Admitting: Radiation Oncology

## 2021-12-03 ENCOUNTER — Other Ambulatory Visit: Payer: Self-pay

## 2021-12-03 ENCOUNTER — Inpatient Hospital Stay: Payer: PPO

## 2021-12-03 VITALS — BP 148/81 | HR 67 | Temp 97.7°F | Resp 18

## 2021-12-03 DIAGNOSIS — Z510 Encounter for antineoplastic radiation therapy: Secondary | ICD-10-CM | POA: Diagnosis not present

## 2021-12-03 DIAGNOSIS — Z5111 Encounter for antineoplastic chemotherapy: Secondary | ICD-10-CM | POA: Diagnosis not present

## 2021-12-03 DIAGNOSIS — C3412 Malignant neoplasm of upper lobe, left bronchus or lung: Secondary | ICD-10-CM | POA: Diagnosis not present

## 2021-12-03 DIAGNOSIS — Z87891 Personal history of nicotine dependence: Secondary | ICD-10-CM | POA: Diagnosis not present

## 2021-12-03 LAB — RAD ONC ARIA SESSION SUMMARY
Course Elapsed Days: 8
Plan Fractions Treated to Date: 7
Plan Prescribed Dose Per Fraction: 2 Gy
Plan Total Fractions Prescribed: 30
Plan Total Prescribed Dose: 60 Gy
Reference Point Dosage Given to Date: 14 Gy
Reference Point Session Dosage Given: 2 Gy
Session Number: 7

## 2021-12-03 MED ORDER — SODIUM CHLORIDE 0.9 % IV SOLN: Freq: Once | INTRAVENOUS | Status: AC

## 2021-12-03 MED ORDER — SODIUM CHLORIDE 0.9 % IV SOLN
100.0000 mg/m2 | Freq: Once | INTRAVENOUS | Status: AC
  Administered 2021-12-03: 190 mg via INTRAVENOUS
  Filled 2021-12-03: qty 9.5

## 2021-12-03 MED ORDER — SODIUM CHLORIDE 0.9 % IV SOLN
10.0000 mg | Freq: Once | INTRAVENOUS | Status: AC
  Administered 2021-12-03: 10 mg via INTRAVENOUS
  Filled 2021-12-03: qty 10

## 2021-12-03 MED FILL — Dexamethasone Sodium Phosphate Inj 100 MG/10ML: INTRAMUSCULAR | Qty: 1 | Status: AC

## 2021-12-04 ENCOUNTER — Inpatient Hospital Stay: Payer: PPO

## 2021-12-04 ENCOUNTER — Other Ambulatory Visit: Payer: Self-pay

## 2021-12-04 ENCOUNTER — Ambulatory Visit
Admission: RE | Admit: 2021-12-04 | Discharge: 2021-12-04 | Disposition: A | Discharge status: Home / Self Care | Payer: PPO | Source: Ambulatory Visit | Attending: Radiation Oncology | Admitting: Radiation Oncology

## 2021-12-04 VITALS — BP 135/79 | HR 63 | Temp 97.6°F | Resp 18

## 2021-12-04 DIAGNOSIS — Z87891 Personal history of nicotine dependence: Secondary | ICD-10-CM | POA: Diagnosis not present

## 2021-12-04 DIAGNOSIS — C3412 Malignant neoplasm of upper lobe, left bronchus or lung: Secondary | ICD-10-CM

## 2021-12-04 DIAGNOSIS — Z5111 Encounter for antineoplastic chemotherapy: Secondary | ICD-10-CM | POA: Diagnosis not present

## 2021-12-04 DIAGNOSIS — Z510 Encounter for antineoplastic radiation therapy: Secondary | ICD-10-CM | POA: Diagnosis not present

## 2021-12-04 LAB — RAD ONC ARIA SESSION SUMMARY
Course Elapsed Days: 9
Plan Fractions Treated to Date: 8
Plan Prescribed Dose Per Fraction: 2 Gy
Plan Total Fractions Prescribed: 30
Plan Total Prescribed Dose: 60 Gy
Reference Point Dosage Given to Date: 16 Gy
Reference Point Session Dosage Given: 2 Gy
Session Number: 8

## 2021-12-04 MED ORDER — SODIUM CHLORIDE 0.9 % IV SOLN: Freq: Once | INTRAVENOUS | Status: AC

## 2021-12-04 MED ORDER — SODIUM CHLORIDE 0.9 % IV SOLN
100.0000 mg/m2 | Freq: Once | INTRAVENOUS | Status: AC
  Administered 2021-12-04: 190 mg via INTRAVENOUS
  Filled 2021-12-04: qty 9.5

## 2021-12-04 MED ORDER — SODIUM CHLORIDE 0.9 % IV SOLN
10.0000 mg | Freq: Once | INTRAVENOUS | Status: AC
  Administered 2021-12-04: 10 mg via INTRAVENOUS
  Filled 2021-12-04: qty 10

## 2021-12-05 ENCOUNTER — Encounter: Payer: Self-pay | Admitting: *Deleted

## 2021-12-05 ENCOUNTER — Other Ambulatory Visit: Payer: Self-pay

## 2021-12-05 ENCOUNTER — Ambulatory Visit
Admission: RE | Admit: 2021-12-05 | Discharge: 2021-12-05 | Disposition: A | Discharge status: Home / Self Care | Payer: PPO | Source: Ambulatory Visit | Attending: Radiation Oncology | Admitting: Radiation Oncology

## 2021-12-05 DIAGNOSIS — Z510 Encounter for antineoplastic radiation therapy: Secondary | ICD-10-CM | POA: Diagnosis not present

## 2021-12-05 DIAGNOSIS — C3412 Malignant neoplasm of upper lobe, left bronchus or lung: Secondary | ICD-10-CM | POA: Diagnosis not present

## 2021-12-05 DIAGNOSIS — Z5111 Encounter for antineoplastic chemotherapy: Secondary | ICD-10-CM | POA: Diagnosis not present

## 2021-12-05 DIAGNOSIS — Z87891 Personal history of nicotine dependence: Secondary | ICD-10-CM | POA: Diagnosis not present

## 2021-12-05 LAB — RAD ONC ARIA SESSION SUMMARY
Course Elapsed Days: 10
Plan Fractions Treated to Date: 9
Plan Prescribed Dose Per Fraction: 2 Gy
Plan Total Fractions Prescribed: 30
Plan Total Prescribed Dose: 60 Gy
Reference Point Dosage Given to Date: 18 Gy
Reference Point Session Dosage Given: 2 Gy
Session Number: 9

## 2021-12-08 ENCOUNTER — Other Ambulatory Visit: Payer: Self-pay

## 2021-12-08 ENCOUNTER — Ambulatory Visit: Payer: PPO

## 2021-12-08 ENCOUNTER — Other Ambulatory Visit: Payer: Self-pay | Admitting: Radiation Oncology

## 2021-12-08 ENCOUNTER — Ambulatory Visit
Admission: RE | Admit: 2021-12-08 | Discharge: 2021-12-08 | Disposition: A | Discharge status: Home / Self Care | Payer: PPO | Source: Ambulatory Visit | Attending: Radiation Oncology | Admitting: Radiation Oncology

## 2021-12-08 ENCOUNTER — Inpatient Hospital Stay: Payer: PPO

## 2021-12-08 DIAGNOSIS — C3412 Malignant neoplasm of upper lobe, left bronchus or lung: Secondary | ICD-10-CM | POA: Diagnosis not present

## 2021-12-08 DIAGNOSIS — Z5111 Encounter for antineoplastic chemotherapy: Secondary | ICD-10-CM | POA: Diagnosis not present

## 2021-12-08 DIAGNOSIS — Z510 Encounter for antineoplastic radiation therapy: Secondary | ICD-10-CM | POA: Diagnosis not present

## 2021-12-08 DIAGNOSIS — Z87891 Personal history of nicotine dependence: Secondary | ICD-10-CM | POA: Diagnosis not present

## 2021-12-08 LAB — CMP (CANCER CENTER ONLY)
ALT: 20 U/L (ref 0–44)
AST: 13 U/L — ABNORMAL LOW (ref 15–41)
Albumin: 3.8 g/dL (ref 3.5–5.0)
Alkaline Phosphatase: 64 U/L (ref 38–126)
Anion gap: 6 (ref 5–15)
BUN: 22 mg/dL (ref 8–23)
CO2: 24 mmol/L (ref 22–32)
Calcium: 9.1 mg/dL (ref 8.9–10.3)
Chloride: 99 mmol/L (ref 98–111)
Creatinine: 0.79 mg/dL (ref 0.61–1.24)
GFR, Estimated: >60 mL/min (ref >60)
Glucose, Bld: 155 mg/dL — ABNORMAL HIGH (ref 70–99)
Potassium: 4.2 mmol/L (ref 3.5–5.1)
Sodium: 129 mmol/L — ABNORMAL LOW (ref 135–145)
Total Bilirubin: 1 mg/dL (ref 0.3–1.2)
Total Protein: 6.2 g/dL — ABNORMAL LOW (ref 6.5–8.1)

## 2021-12-08 LAB — RAD ONC ARIA SESSION SUMMARY
Course Elapsed Days: 13
Plan Fractions Treated to Date: 10
Plan Prescribed Dose Per Fraction: 2 Gy
Plan Total Fractions Prescribed: 30
Plan Total Prescribed Dose: 60 Gy
Reference Point Dosage Given to Date: 20 Gy
Reference Point Session Dosage Given: 2 Gy
Session Number: 10

## 2021-12-08 LAB — CBC WITH DIFFERENTIAL (CANCER CENTER ONLY)
Abs Immature Granulocytes: 0.04 10*3/uL (ref 0.00–0.07)
Basophils Absolute: 0 10*3/uL (ref 0.0–0.1)
Basophils Relative: 1 %
Eosinophils Absolute: 0 10*3/uL (ref 0.0–0.5)
Eosinophils Relative: 0 %
HCT: 39.4 % (ref 39.0–52.0)
Hemoglobin: 14.1 g/dL (ref 13.0–17.0)
Immature Granulocytes: 1 %
Lymphocytes Relative: 13 %
Lymphs Abs: 0.7 10*3/uL (ref 0.7–4.0)
MCH: 32.8 pg (ref 26.0–34.0)
MCHC: 35.8 g/dL (ref 30.0–36.0)
MCV: 91.6 fL (ref 80.0–100.0)
Monocytes Absolute: 0.1 10*3/uL (ref 0.1–1.0)
Monocytes Relative: 1 %
Neutro Abs: 4.7 10*3/uL (ref 1.7–7.7)
Neutrophils Relative %: 84 %
Platelet Count: 165 10*3/uL (ref 150–400)
RBC: 4.3 MIL/uL (ref 4.22–5.81)
RDW: 12.5 % (ref 11.5–15.5)
WBC Count: 5.6 10*3/uL (ref 4.0–10.5)
nRBC: 0 % (ref 0.0–0.2)

## 2021-12-08 LAB — MAGNESIUM: Magnesium: 2 mg/dL (ref 1.7–2.4)

## 2021-12-08 MED ORDER — SUCRALFATE 1 G PO TABS: 1.0000 g | ORAL_TABLET | Freq: Three times a day (TID) | ORAL | 1 refills | Status: DC

## 2021-12-09 ENCOUNTER — Other Ambulatory Visit: Payer: Self-pay

## 2021-12-09 ENCOUNTER — Ambulatory Visit
Admission: RE | Admit: 2021-12-09 | Discharge: 2021-12-09 | Disposition: A | Discharge status: Home / Self Care | Payer: PPO | Source: Ambulatory Visit | Attending: Radiation Oncology | Admitting: Radiation Oncology

## 2021-12-09 DIAGNOSIS — Z5111 Encounter for antineoplastic chemotherapy: Secondary | ICD-10-CM | POA: Diagnosis not present

## 2021-12-09 DIAGNOSIS — Z87891 Personal history of nicotine dependence: Secondary | ICD-10-CM | POA: Diagnosis not present

## 2021-12-09 DIAGNOSIS — Z510 Encounter for antineoplastic radiation therapy: Secondary | ICD-10-CM | POA: Diagnosis not present

## 2021-12-09 DIAGNOSIS — C3412 Malignant neoplasm of upper lobe, left bronchus or lung: Secondary | ICD-10-CM | POA: Diagnosis not present

## 2021-12-09 LAB — RAD ONC ARIA SESSION SUMMARY
Course Elapsed Days: 14
Plan Fractions Treated to Date: 11
Plan Prescribed Dose Per Fraction: 2 Gy
Plan Total Fractions Prescribed: 30
Plan Total Prescribed Dose: 60 Gy
Reference Point Dosage Given to Date: 22 Gy
Reference Point Session Dosage Given: 2 Gy
Session Number: 11

## 2021-12-10 ENCOUNTER — Ambulatory Visit
Admission: RE | Admit: 2021-12-10 | Discharge: 2021-12-10 | Disposition: A | Discharge status: Home / Self Care | Payer: PPO | Source: Ambulatory Visit | Attending: Radiation Oncology | Admitting: Radiation Oncology

## 2021-12-10 ENCOUNTER — Other Ambulatory Visit: Payer: Self-pay

## 2021-12-10 DIAGNOSIS — Z87891 Personal history of nicotine dependence: Secondary | ICD-10-CM | POA: Diagnosis not present

## 2021-12-10 DIAGNOSIS — Z510 Encounter for antineoplastic radiation therapy: Secondary | ICD-10-CM | POA: Diagnosis not present

## 2021-12-10 DIAGNOSIS — Z5111 Encounter for antineoplastic chemotherapy: Secondary | ICD-10-CM | POA: Diagnosis not present

## 2021-12-10 DIAGNOSIS — C3412 Malignant neoplasm of upper lobe, left bronchus or lung: Secondary | ICD-10-CM | POA: Diagnosis not present

## 2021-12-10 LAB — RAD ONC ARIA SESSION SUMMARY
Course Elapsed Days: 15
Plan Fractions Treated to Date: 12
Plan Prescribed Dose Per Fraction: 2 Gy
Plan Total Fractions Prescribed: 30
Plan Total Prescribed Dose: 60 Gy
Reference Point Dosage Given to Date: 24 Gy
Reference Point Session Dosage Given: 2 Gy
Session Number: 12

## 2021-12-11 ENCOUNTER — Other Ambulatory Visit: Payer: Self-pay

## 2021-12-11 ENCOUNTER — Ambulatory Visit
Admission: RE | Admit: 2021-12-11 | Discharge: 2021-12-11 | Disposition: A | Discharge status: Home / Self Care | Payer: PPO | Source: Ambulatory Visit | Attending: Radiation Oncology | Admitting: Radiation Oncology

## 2021-12-11 DIAGNOSIS — Z510 Encounter for antineoplastic radiation therapy: Secondary | ICD-10-CM | POA: Diagnosis not present

## 2021-12-11 DIAGNOSIS — Z87891 Personal history of nicotine dependence: Secondary | ICD-10-CM | POA: Diagnosis not present

## 2021-12-11 DIAGNOSIS — Z5111 Encounter for antineoplastic chemotherapy: Secondary | ICD-10-CM | POA: Diagnosis not present

## 2021-12-11 DIAGNOSIS — C3412 Malignant neoplasm of upper lobe, left bronchus or lung: Secondary | ICD-10-CM | POA: Diagnosis not present

## 2021-12-11 LAB — RAD ONC ARIA SESSION SUMMARY
Course Elapsed Days: 16
Plan Fractions Treated to Date: 13
Plan Prescribed Dose Per Fraction: 2 Gy
Plan Total Fractions Prescribed: 30
Plan Total Prescribed Dose: 60 Gy
Reference Point Dosage Given to Date: 26 Gy
Reference Point Session Dosage Given: 2 Gy
Session Number: 13

## 2021-12-12 ENCOUNTER — Ambulatory Visit
Admission: RE | Admit: 2021-12-12 | Discharge: 2021-12-12 | Disposition: A | Discharge status: Home / Self Care | Payer: PPO | Source: Ambulatory Visit | Attending: Radiation Oncology | Admitting: Radiation Oncology

## 2021-12-12 ENCOUNTER — Other Ambulatory Visit: Payer: Self-pay

## 2021-12-12 DIAGNOSIS — C3412 Malignant neoplasm of upper lobe, left bronchus or lung: Secondary | ICD-10-CM | POA: Diagnosis not present

## 2021-12-12 DIAGNOSIS — Z5111 Encounter for antineoplastic chemotherapy: Secondary | ICD-10-CM | POA: Diagnosis not present

## 2021-12-12 DIAGNOSIS — Z510 Encounter for antineoplastic radiation therapy: Secondary | ICD-10-CM | POA: Diagnosis not present

## 2021-12-12 DIAGNOSIS — Z87891 Personal history of nicotine dependence: Secondary | ICD-10-CM | POA: Diagnosis not present

## 2021-12-12 LAB — RAD ONC ARIA SESSION SUMMARY
Course Elapsed Days: 17
Plan Fractions Treated to Date: 14
Plan Prescribed Dose Per Fraction: 2 Gy
Plan Total Fractions Prescribed: 30
Plan Total Prescribed Dose: 60 Gy
Reference Point Dosage Given to Date: 28 Gy
Reference Point Session Dosage Given: 2 Gy
Session Number: 14

## 2021-12-15 ENCOUNTER — Telehealth: Payer: Self-pay | Admitting: Oncology

## 2021-12-15 ENCOUNTER — Other Ambulatory Visit: Payer: Self-pay

## 2021-12-15 ENCOUNTER — Inpatient Hospital Stay: Payer: PPO

## 2021-12-15 ENCOUNTER — Ambulatory Visit
Admission: RE | Admit: 2021-12-15 | Discharge: 2021-12-15 | Disposition: A | Discharge status: Home / Self Care | Payer: PPO | Source: Ambulatory Visit | Attending: Radiation Oncology | Admitting: Radiation Oncology

## 2021-12-15 ENCOUNTER — Other Ambulatory Visit: Payer: Self-pay | Admitting: Radiation Oncology

## 2021-12-15 DIAGNOSIS — C3412 Malignant neoplasm of upper lobe, left bronchus or lung: Secondary | ICD-10-CM

## 2021-12-15 DIAGNOSIS — Z510 Encounter for antineoplastic radiation therapy: Secondary | ICD-10-CM | POA: Diagnosis not present

## 2021-12-15 DIAGNOSIS — Z87891 Personal history of nicotine dependence: Secondary | ICD-10-CM | POA: Diagnosis not present

## 2021-12-15 DIAGNOSIS — Z5111 Encounter for antineoplastic chemotherapy: Secondary | ICD-10-CM | POA: Diagnosis not present

## 2021-12-15 LAB — RAD ONC ARIA SESSION SUMMARY
Course Elapsed Days: 20
Plan Fractions Treated to Date: 15
Plan Prescribed Dose Per Fraction: 2 Gy
Plan Total Fractions Prescribed: 30
Plan Total Prescribed Dose: 60 Gy
Reference Point Dosage Given to Date: 30 Gy
Reference Point Session Dosage Given: 2 Gy
Session Number: 15

## 2021-12-15 LAB — CMP (CANCER CENTER ONLY)
ALT: 13 U/L (ref 0–44)
AST: 11 U/L — ABNORMAL LOW (ref 15–41)
Albumin: 3.7 g/dL (ref 3.5–5.0)
Alkaline Phosphatase: 69 U/L (ref 38–126)
Anion gap: 3 — ABNORMAL LOW (ref 5–15)
BUN: 10 mg/dL (ref 8–23)
CO2: 29 mmol/L (ref 22–32)
Calcium: 9.1 mg/dL (ref 8.9–10.3)
Chloride: 98 mmol/L (ref 98–111)
Creatinine: 0.79 mg/dL (ref 0.61–1.24)
GFR, Estimated: >60 mL/min (ref >60)
Glucose, Bld: 138 mg/dL — ABNORMAL HIGH (ref 70–99)
Potassium: 4.2 mmol/L (ref 3.5–5.1)
Sodium: 130 mmol/L — ABNORMAL LOW (ref 135–145)
Total Bilirubin: 0.8 mg/dL (ref 0.3–1.2)
Total Protein: 6.1 g/dL — ABNORMAL LOW (ref 6.5–8.1)

## 2021-12-15 LAB — CBC WITH DIFFERENTIAL (CANCER CENTER ONLY)
Abs Immature Granulocytes: 0.01 10*3/uL (ref 0.00–0.07)
Basophils Absolute: 0 10*3/uL (ref 0.0–0.1)
Basophils Relative: 1 %
Eosinophils Absolute: 0.1 10*3/uL (ref 0.0–0.5)
Eosinophils Relative: 3 %
HCT: 31.9 % — ABNORMAL LOW (ref 39.0–52.0)
Hemoglobin: 11.4 g/dL — ABNORMAL LOW (ref 13.0–17.0)
Immature Granulocytes: 1 %
Lymphocytes Relative: 32 %
Lymphs Abs: 0.7 10*3/uL (ref 0.7–4.0)
MCH: 32.5 pg (ref 26.0–34.0)
MCHC: 35.7 g/dL (ref 30.0–36.0)
MCV: 90.9 fL (ref 80.0–100.0)
Monocytes Absolute: 0.1 10*3/uL (ref 0.1–1.0)
Monocytes Relative: 5 %
Neutro Abs: 1.3 10*3/uL — ABNORMAL LOW (ref 1.7–7.7)
Neutrophils Relative %: 58 %
Platelet Count: 43 10*3/uL — ABNORMAL LOW (ref 150–400)
RBC: 3.51 MIL/uL — ABNORMAL LOW (ref 4.22–5.81)
RDW: 12.1 % (ref 11.5–15.5)
WBC Count: 2.1 10*3/uL — ABNORMAL LOW (ref 4.0–10.5)
nRBC: 0 % (ref 0.0–0.2)

## 2021-12-15 LAB — MAGNESIUM: Magnesium: 1.9 mg/dL (ref 1.7–2.4)

## 2021-12-15 MED ORDER — LIDOCAINE VISCOUS HCL 2 % MT SOLN: 15.0000 mL | OROMUCOSAL | 2 refills | Status: DC | PRN

## 2021-12-15 NOTE — Telephone Encounter (Signed)
Jonathan Castro called and said his pharmacy does not carry lidocaine.  He is wondering if it can be sent to another pharmacy.  Advised that we will check to see if [facility] has it and will call him back.

## 2021-12-15 NOTE — Telephone Encounter (Signed)
New prescription for Lidocaine 2% called in to [facility] on [address] in [place] Patient made aware.

## 2021-12-15 NOTE — Addendum Note (Signed)
Addended by: Elmo Putt R on: 12/15/2021 01:39 PM   Modules accepted: Orders

## 2021-12-16 ENCOUNTER — Encounter: Payer: Self-pay | Admitting: Internal Medicine

## 2021-12-16 ENCOUNTER — Ambulatory Visit
Admission: RE | Admit: 2021-12-16 | Discharge: 2021-12-16 | Disposition: A | Discharge status: Home / Self Care | Payer: PPO | Source: Ambulatory Visit | Attending: Radiation Oncology | Admitting: Radiation Oncology

## 2021-12-16 ENCOUNTER — Other Ambulatory Visit: Payer: Self-pay

## 2021-12-16 DIAGNOSIS — Z510 Encounter for antineoplastic radiation therapy: Secondary | ICD-10-CM | POA: Diagnosis not present

## 2021-12-16 DIAGNOSIS — Z87891 Personal history of nicotine dependence: Secondary | ICD-10-CM | POA: Diagnosis not present

## 2021-12-16 DIAGNOSIS — C3412 Malignant neoplasm of upper lobe, left bronchus or lung: Secondary | ICD-10-CM | POA: Diagnosis not present

## 2021-12-16 DIAGNOSIS — Z5111 Encounter for antineoplastic chemotherapy: Secondary | ICD-10-CM | POA: Diagnosis not present

## 2021-12-16 LAB — RAD ONC ARIA SESSION SUMMARY
Course Elapsed Days: 21
Plan Fractions Treated to Date: 16
Plan Prescribed Dose Per Fraction: 2 Gy
Plan Total Fractions Prescribed: 30
Plan Total Prescribed Dose: 60 Gy
Reference Point Dosage Given to Date: 32 Gy
Reference Point Session Dosage Given: 2 Gy
Session Number: 16

## 2021-12-17 ENCOUNTER — Other Ambulatory Visit: Payer: Self-pay

## 2021-12-17 ENCOUNTER — Ambulatory Visit
Admission: RE | Admit: 2021-12-17 | Discharge: 2021-12-17 | Disposition: A | Discharge status: Home / Self Care | Payer: PPO | Source: Ambulatory Visit | Attending: Radiation Oncology | Admitting: Radiation Oncology

## 2021-12-17 ENCOUNTER — Encounter: Payer: Self-pay | Admitting: Medical Oncology

## 2021-12-17 DIAGNOSIS — Z5111 Encounter for antineoplastic chemotherapy: Secondary | ICD-10-CM | POA: Diagnosis not present

## 2021-12-17 DIAGNOSIS — Z87891 Personal history of nicotine dependence: Secondary | ICD-10-CM | POA: Diagnosis not present

## 2021-12-17 DIAGNOSIS — Z510 Encounter for antineoplastic radiation therapy: Secondary | ICD-10-CM | POA: Diagnosis not present

## 2021-12-17 DIAGNOSIS — C3412 Malignant neoplasm of upper lobe, left bronchus or lung: Secondary | ICD-10-CM | POA: Diagnosis not present

## 2021-12-17 LAB — RAD ONC ARIA SESSION SUMMARY
Course Elapsed Days: 22
Plan Fractions Treated to Date: 17
Plan Prescribed Dose Per Fraction: 2 Gy
Plan Total Fractions Prescribed: 30
Plan Total Prescribed Dose: 60 Gy
Reference Point Dosage Given to Date: 34 Gy
Reference Point Session Dosage Given: 2 Gy
Session Number: 17

## 2021-12-18 ENCOUNTER — Other Ambulatory Visit: Payer: Self-pay

## 2021-12-18 ENCOUNTER — Ambulatory Visit
Admission: RE | Admit: 2021-12-18 | Discharge: 2021-12-18 | Disposition: A | Discharge status: Home / Self Care | Payer: PPO | Source: Ambulatory Visit | Attending: Radiation Oncology | Admitting: Radiation Oncology

## 2021-12-18 DIAGNOSIS — Z510 Encounter for antineoplastic radiation therapy: Secondary | ICD-10-CM | POA: Diagnosis not present

## 2021-12-18 DIAGNOSIS — Z87891 Personal history of nicotine dependence: Secondary | ICD-10-CM | POA: Diagnosis not present

## 2021-12-18 DIAGNOSIS — C3412 Malignant neoplasm of upper lobe, left bronchus or lung: Secondary | ICD-10-CM | POA: Diagnosis not present

## 2021-12-18 DIAGNOSIS — Z5111 Encounter for antineoplastic chemotherapy: Secondary | ICD-10-CM | POA: Diagnosis not present

## 2021-12-18 LAB — RAD ONC ARIA SESSION SUMMARY
Course Elapsed Days: 23
Plan Fractions Treated to Date: 18
Plan Prescribed Dose Per Fraction: 2 Gy
Plan Total Fractions Prescribed: 30
Plan Total Prescribed Dose: 60 Gy
Reference Point Dosage Given to Date: 36 Gy
Reference Point Session Dosage Given: 2 Gy
Session Number: 18

## 2021-12-19 ENCOUNTER — Other Ambulatory Visit: Payer: Self-pay

## 2021-12-19 ENCOUNTER — Ambulatory Visit
Admission: RE | Admit: 2021-12-19 | Discharge: 2021-12-19 | Disposition: A | Discharge status: Home / Self Care | Payer: PPO | Source: Ambulatory Visit | Attending: Radiation Oncology | Admitting: Radiation Oncology

## 2021-12-19 DIAGNOSIS — Z510 Encounter for antineoplastic radiation therapy: Secondary | ICD-10-CM | POA: Diagnosis not present

## 2021-12-19 DIAGNOSIS — Z5111 Encounter for antineoplastic chemotherapy: Secondary | ICD-10-CM | POA: Diagnosis not present

## 2021-12-19 DIAGNOSIS — Z87891 Personal history of nicotine dependence: Secondary | ICD-10-CM | POA: Diagnosis not present

## 2021-12-19 DIAGNOSIS — C3412 Malignant neoplasm of upper lobe, left bronchus or lung: Secondary | ICD-10-CM | POA: Diagnosis not present

## 2021-12-19 LAB — RAD ONC ARIA SESSION SUMMARY
Course Elapsed Days: 24
Plan Fractions Treated to Date: 19
Plan Prescribed Dose Per Fraction: 2 Gy
Plan Total Fractions Prescribed: 30
Plan Total Prescribed Dose: 60 Gy
Reference Point Dosage Given to Date: 38 Gy
Reference Point Session Dosage Given: 2 Gy
Session Number: 19

## 2021-12-19 MED FILL — Fosaprepitant Dimeglumine For IV Infusion 150 MG (Base Eq): INTRAVENOUS | Qty: 5 | Status: AC

## 2021-12-19 MED FILL — Dexamethasone Sodium Phosphate Inj 100 MG/10ML: INTRAMUSCULAR | Qty: 1 | Status: AC

## 2021-12-22 ENCOUNTER — Ambulatory Visit
Admission: RE | Admit: 2021-12-22 | Discharge: 2021-12-22 | Disposition: A | Discharge status: Home / Self Care | Payer: PPO | Source: Ambulatory Visit | Attending: Radiation Oncology | Admitting: Radiation Oncology

## 2021-12-22 ENCOUNTER — Inpatient Hospital Stay: Payer: PPO

## 2021-12-22 ENCOUNTER — Inpatient Hospital Stay (HOSPITAL_BASED_OUTPATIENT_CLINIC_OR_DEPARTMENT_OTHER): Payer: PPO | Admitting: Internal Medicine

## 2021-12-22 ENCOUNTER — Other Ambulatory Visit: Payer: Self-pay

## 2021-12-22 VITALS — BP 101/70 | HR 75 | Temp 97.1°F | Resp 18 | Wt 158.1 lb

## 2021-12-22 DIAGNOSIS — C3412 Malignant neoplasm of upper lobe, left bronchus or lung: Secondary | ICD-10-CM

## 2021-12-22 DIAGNOSIS — Z87891 Personal history of nicotine dependence: Secondary | ICD-10-CM | POA: Diagnosis not present

## 2021-12-22 DIAGNOSIS — Z5111 Encounter for antineoplastic chemotherapy: Secondary | ICD-10-CM | POA: Diagnosis not present

## 2021-12-22 DIAGNOSIS — Z510 Encounter for antineoplastic radiation therapy: Secondary | ICD-10-CM | POA: Diagnosis not present

## 2021-12-22 LAB — CBC WITH DIFFERENTIAL (CANCER CENTER ONLY)
Abs Immature Granulocytes: 0.01 10*3/uL (ref 0.00–0.07)
Basophils Absolute: 0 10*3/uL (ref 0.0–0.1)
Basophils Relative: 1 %
Eosinophils Absolute: 0.1 10*3/uL (ref 0.0–0.5)
Eosinophils Relative: 3 %
HCT: 32.2 % — ABNORMAL LOW (ref 39.0–52.0)
Hemoglobin: 11.4 g/dL — ABNORMAL LOW (ref 13.0–17.0)
Immature Granulocytes: 0 %
Lymphocytes Relative: 24 %
Lymphs Abs: 0.6 10*3/uL — ABNORMAL LOW (ref 0.7–4.0)
MCH: 32.9 pg (ref 26.0–34.0)
MCHC: 35.4 g/dL (ref 30.0–36.0)
MCV: 93.1 fL (ref 80.0–100.0)
Monocytes Absolute: 0.7 10*3/uL (ref 0.1–1.0)
Monocytes Relative: 31 %
Neutro Abs: 1 10*3/uL — ABNORMAL LOW (ref 1.7–7.7)
Neutrophils Relative %: 41 %
Platelet Count: 162 10*3/uL (ref 150–400)
RBC: 3.46 MIL/uL — ABNORMAL LOW (ref 4.22–5.81)
RDW: 13.6 % (ref 11.5–15.5)
WBC Count: 2.4 10*3/uL — ABNORMAL LOW (ref 4.0–10.5)
nRBC: 0 % (ref 0.0–0.2)

## 2021-12-22 LAB — RAD ONC ARIA SESSION SUMMARY
Course Elapsed Days: 27
Plan Fractions Treated to Date: 20
Plan Prescribed Dose Per Fraction: 2 Gy
Plan Total Fractions Prescribed: 30
Plan Total Prescribed Dose: 60 Gy
Reference Point Dosage Given to Date: 40 Gy
Reference Point Session Dosage Given: 2 Gy
Session Number: 20

## 2021-12-22 LAB — CMP (CANCER CENTER ONLY)
ALT: 12 U/L (ref 0–44)
AST: 12 U/L — ABNORMAL LOW (ref 15–41)
Albumin: 3.8 g/dL (ref 3.5–5.0)
Alkaline Phosphatase: 75 U/L (ref 38–126)
Anion gap: 5 (ref 5–15)
BUN: 5 mg/dL — ABNORMAL LOW (ref 8–23)
CO2: 27 mmol/L (ref 22–32)
Calcium: 9.2 mg/dL (ref 8.9–10.3)
Chloride: 98 mmol/L (ref 98–111)
Creatinine: 0.83 mg/dL (ref 0.61–1.24)
GFR, Estimated: >60 mL/min (ref >60)
Glucose, Bld: 99 mg/dL (ref 70–99)
Potassium: 4.3 mmol/L (ref 3.5–5.1)
Sodium: 130 mmol/L — ABNORMAL LOW (ref 135–145)
Total Bilirubin: 0.4 mg/dL (ref 0.3–1.2)
Total Protein: 6.2 g/dL — ABNORMAL LOW (ref 6.5–8.1)

## 2021-12-22 LAB — MAGNESIUM: Magnesium: 1.8 mg/dL (ref 1.7–2.4)

## 2021-12-22 MED ORDER — MAGNESIUM SULFATE 2 GM/50ML IV SOLN
2.0000 g | Freq: Once | INTRAVENOUS | Status: AC
  Administered 2021-12-22: 2 g via INTRAVENOUS
  Filled 2021-12-22: qty 50

## 2021-12-22 MED ORDER — POTASSIUM CHLORIDE IN NACL 20-0.9 MEQ/L-% IV SOLN
Freq: Once | INTRAVENOUS | Status: AC
  Filled 2021-12-22: qty 1000

## 2021-12-22 MED ORDER — SODIUM CHLORIDE 0.9% FLUSH: 10.0000 mL | INTRAVENOUS | Status: DC | PRN

## 2021-12-22 MED ORDER — HEPARIN SOD (PORK) LOCK FLUSH 100 UNIT/ML IV SOLN: 500.0000 [IU] | Freq: Once | INTRAVENOUS | Status: DC | PRN

## 2021-12-22 MED ORDER — SODIUM CHLORIDE 0.9 % IV SOLN
10.0000 mg | Freq: Once | INTRAVENOUS | Status: AC
  Administered 2021-12-22: 10 mg via INTRAVENOUS
  Filled 2021-12-22: qty 10

## 2021-12-22 MED ORDER — SODIUM CHLORIDE 0.9 % IV SOLN: Freq: Once | INTRAVENOUS | Status: AC

## 2021-12-22 MED ORDER — SODIUM CHLORIDE 0.9 % IV SOLN
60.0000 mg/m2 | Freq: Once | INTRAVENOUS | Status: AC
  Administered 2021-12-22: 111 mg via INTRAVENOUS
  Filled 2021-12-22: qty 111

## 2021-12-22 MED ORDER — PALONOSETRON HCL INJECTION 0.25 MG/5ML
0.2500 mg | Freq: Once | INTRAVENOUS | Status: AC
  Administered 2021-12-22: 0.25 mg via INTRAVENOUS
  Filled 2021-12-22: qty 5

## 2021-12-22 MED ORDER — SODIUM CHLORIDE 0.9 % IV SOLN
150.0000 mg | Freq: Once | INTRAVENOUS | Status: AC
  Administered 2021-12-22: 150 mg via INTRAVENOUS
  Filled 2021-12-22: qty 150

## 2021-12-22 MED ORDER — SODIUM CHLORIDE 0.9 % IV SOLN
80.0000 mg/m2 | Freq: Once | INTRAVENOUS | Status: AC
  Administered 2021-12-22: 150 mg via INTRAVENOUS
  Filled 2021-12-22: qty 7.5

## 2021-12-22 NOTE — Progress Notes (Signed)
[facility] [phone]   Fax:(336) [phone]  OFFICE PROGRESS NOTE  Jonathan Perches, MD [address] [place] [address]  DIAGNOSIS: Limited stage (T1c, N2, M0) small cell lung cancer presented with left upper lobe lung mass in addition to left hilar and mediastinal lymphadenopathy diagnosed in April 2023.  PRIOR THERAPY: None  CURRENT THERAPY: Systemic chemotherapy with cisplatin 75 Mg/M2 on day 1 and 2 etoposide 100 Mg/M2 on days 1, 2 and 3 every 3 weeks.  First cycle 11/10/2021.  This is concurrent with radiotherapy.  Status post 2 cycles.  INTERVAL HISTORY: Jonathan Castro 66 y.o. male returns to the clinic today for follow-up visit.  The patient is feeling fine today with no concerning complaints except for the hoarseness of his voice as well as soreness in his throat from the radiotherapy.  He denied having any current chest pain, shortness of breath, cough or hemoptysis.  He has no nausea, vomiting, diarrhea or constipation.  He has no headache or visual changes.  He has no recent weight loss or night sweats.  He has been tolerating his treatment with systemic chemotherapy fairly well.  The patient is here today for evaluation before starting cycle #3.  MEDICAL HISTORY: Past Medical History:  Diagnosis Date   Arthritis    Cancer (HCC)    COPD (chronic obstructive pulmonary disease) (HCC)     ALLERGIES:  is allergic to penicillamine, penicillins, and leflunomide.  MEDICATIONS:  Current Outpatient Medications  Medication Sig Dispense Refill   acetaminophen (TYLENOL) 650 MG CR tablet Take 1,300 mg by mouth daily.     albuterol (VENTOLIN HFA) 108 (90 Base) MCG/ACT inhaler Inhale 2 puffs into the lungs every 6 (six) hours as needed for wheezing or shortness of breath. (Patient not taking: Reported on 11/17/2021)     fexofenadine-pseudoephedrine (ALLEGRA-D) 60-120 MG 12 hr tablet Take 1 tablet by mouth 2 (two) times daily as needed (allergies).      folic acid (FOLVITE) 1 MG tablet Take 1 mg by mouth daily.     lidocaine (XYLOCAINE) 2 % solution Use as directed 15 mLs in the mouth or throat as needed for mouth pain. [place] per instructions given by nursing, swallow 20 min prior to meals and bedtime 100 mL 2   methotrexate 50 MG/2ML injection Inject 15 mg into the vein every Wednesday. (Patient not taking: Reported on 11/17/2021)     oxyCODONE-acetaminophen (PERCOCET/ROXICET) 5-325 MG tablet Take 1 tablet by mouth every 8 (eight) hours as needed for severe pain. 30 tablet 0   predniSONE (DELTASONE) 5 MG tablet Take 5 mg by mouth daily as needed (arthritis flare). (Patient not taking: Reported on 11/17/2021)     prochlorperazine (COMPAZINE) 10 MG tablet Take 1 tablet (10 mg total) by mouth every 6 (six) hours as needed for nausea or vomiting. (Patient not taking: Reported on 11/12/2021) 30 tablet 0   sucralfate (CARAFATE) 1 g tablet Take 1 tablet (1 g total) by mouth 4 (four) times daily -  with meals and at bedtime. Crush and dissolve in 10 mL of warm water prior to swallowing 120 tablet 1   No current facility-administered medications for this visit.    SURGICAL HISTORY:  Past Surgical History:  Procedure Laterality Date   MEDIASTINOSCOPY N/A 10/20/2021   Procedure: MEDIASTINOSCOPY;  Surgeon: Jonathan Slot, MD;  Location: [facility] OR;  Service: Thoracic;  Laterality: N/A;   ROTATOR CUFF REPAIR Left    VIDEO BRONCHOSCOPY WITH  ENDOBRONCHIAL ULTRASOUND N/A 10/20/2021   Procedure: VIDEO BRONCHOSCOPY WITH ENDOBRONCHIAL ULTRASOUND WITH BIOPSIES;  Surgeon: Jonathan Slot, MD;  Location: [facility] OR;  Service: Thoracic;  Laterality: N/A;    REVIEW OF SYSTEMS:  Constitutional: positive for fatigue Eyes: negative Ears, nose, mouth, throat, and face: positive for hoarseness Respiratory: negative Cardiovascular: negative Gastrointestinal: positive for odynophagia Genitourinary:negative Integument/breast: negative Hematologic/lymphatic:  negative Musculoskeletal:negative Neurological: negative Behavioral/Psych: negative Endocrine: negative Allergic/Immunologic: negative   PHYSICAL EXAMINATION: General appearance: alert, cooperative, fatigued, and no distress Head: Normocephalic, without obvious abnormality, atraumatic Neck: no adenopathy, no JVD, supple, symmetrical, trachea midline, and thyroid not enlarged, symmetric, no tenderness/mass/nodules Lymph nodes: Cervical, supraclavicular, and axillary nodes normal. Resp: clear to auscultation bilaterally Back: symmetric, no curvature. ROM normal. No CVA tenderness. Cardio: regular rate and rhythm, S1, S2 normal, no murmur, click, rub or gallop GI: soft, non-tender; bowel sounds normal; no masses,  no organomegaly Extremities: extremities normal, atraumatic, no cyanosis or edema Neurologic: Alert and oriented X 3, normal strength and tone. Normal symmetric reflexes. Normal coordination and gait  ECOG PERFORMANCE STATUS: 1 - Symptomatic but completely ambulatory  Blood pressure 101/70, pulse 75, temperature (!) 97.1 F (36.2 C), temperature source Tympanic, resp. rate 18, weight 158 lb 1 oz (71.7 kg), SpO2 99 %.  LABORATORY DATA: Lab Results  Component Value Date   WBC 2.4 (L) 12/22/2021   HGB 11.4 (L) 12/22/2021   HCT 32.2 (L) 12/22/2021   MCV 93.1 12/22/2021   PLT 162 12/22/2021      Chemistry      Component Value Date/Time   NA 130 (L) 12/22/2021 0912   K 4.3 12/22/2021 0912   CL 98 12/22/2021 0912   CO2 27 12/22/2021 0912   BUN 5 (L) 12/22/2021 0912   CREATININE 0.83 12/22/2021 0912      Component Value Date/Time   CALCIUM 9.2 12/22/2021 0912   ALKPHOS 75 12/22/2021 0912   AST 12 (L) 12/22/2021 0912   ALT 12 12/22/2021 0912   BILITOT 0.4 12/22/2021 0912       RADIOGRAPHIC STUDIES: No results found.  ASSESSMENT AND PLAN: This is a very pleasant 66 years old white male recently diagnosed with limited stage (T1c, N2, M0) small cell lung cancer  presented with left upper lobe lung mass in addition to left hilar and mediastinal lymphadenopathy diagnosed in April 2023. The patient is currently undergoing systemic chemotherapy with cisplatin 75 Mg/M2 on day 1 and oh etoposide 100 Mg/M2 on days 1, 2 and 3 every 3 weeks.  Status post 2 cycles.   The patient has been tolerating this treatment fairly well except for fatigue and also persistent hoarseness of his voice and odynophagia from radiation. His absolute neutrophil count is low today at 1000. I discussed with the patient the option of delaying his treatment until next week versus proceeding with the treatment today with reduced dose cisplatin at 60 Mg/M2 and the etoposide to 80 Mg/M2 for cycle #3 and close monitoring of his blood work. The patient would like to proceed with the treatment as planned as it may be hard to reschedule his treatment next week because of 4 July holiday. We will proceed with cycle #3 as planned. I will see him back for follow-up visit in 3 weeks for evaluation with repeat blood work but the patient was advised to go immediately to the emergency department if he develop any fever or chills after his treatment.  He was also advised to avoid any contact with sick people.  The patient voices understanding of current disease status and treatment options and is in agreement with the current care plan.  All questions were answered. The patient knows to call the clinic with any problems, questions or concerns. We can certainly see the patient much sooner if necessary.  The total time spent in the appointment was 30 minutes.  Disclaimer: This note was dictated with voice recognition software. Similar sounding words can inadvertently be transcribed and may not be corrected upon review.

## 2021-12-22 NOTE — Progress Notes (Signed)
Ok to treat with ANC of 1.0 & to increase post hydration fluid rate per Dr. Arbutus Ped.

## 2021-12-23 ENCOUNTER — Ambulatory Visit: Payer: PPO

## 2021-12-23 ENCOUNTER — Inpatient Hospital Stay: Payer: PPO

## 2021-12-23 ENCOUNTER — Ambulatory Visit
Admission: RE | Admit: 2021-12-23 | Discharge: 2021-12-23 | Disposition: A | Discharge status: Home / Self Care | Payer: PPO | Source: Ambulatory Visit | Attending: Radiation Oncology | Admitting: Radiation Oncology

## 2021-12-23 ENCOUNTER — Other Ambulatory Visit: Payer: Self-pay

## 2021-12-23 VITALS — BP 136/65 | HR 73 | Temp 97.8°F | Resp 18

## 2021-12-23 DIAGNOSIS — Z5111 Encounter for antineoplastic chemotherapy: Secondary | ICD-10-CM | POA: Diagnosis not present

## 2021-12-23 DIAGNOSIS — C3412 Malignant neoplasm of upper lobe, left bronchus or lung: Secondary | ICD-10-CM

## 2021-12-23 DIAGNOSIS — Z510 Encounter for antineoplastic radiation therapy: Secondary | ICD-10-CM | POA: Diagnosis not present

## 2021-12-23 DIAGNOSIS — Z87891 Personal history of nicotine dependence: Secondary | ICD-10-CM | POA: Diagnosis not present

## 2021-12-23 LAB — RAD ONC ARIA SESSION SUMMARY
Course Elapsed Days: 28
Plan Fractions Treated to Date: 21
Plan Prescribed Dose Per Fraction: 2 Gy
Plan Total Fractions Prescribed: 30
Plan Total Prescribed Dose: 60 Gy
Reference Point Dosage Given to Date: 42 Gy
Reference Point Session Dosage Given: 2 Gy
Session Number: 21

## 2021-12-23 MED ORDER — SODIUM CHLORIDE 0.9 % IV SOLN
10.0000 mg | Freq: Once | INTRAVENOUS | Status: AC
  Administered 2021-12-23: 10 mg via INTRAVENOUS
  Filled 2021-12-23: qty 10

## 2021-12-23 MED ORDER — SODIUM CHLORIDE 0.9 % IV SOLN: Freq: Once | INTRAVENOUS | Status: AC

## 2021-12-23 MED ORDER — SODIUM CHLORIDE 0.9 % IV SOLN
80.0000 mg/m2 | Freq: Once | INTRAVENOUS | Status: AC
  Administered 2021-12-23: 150 mg via INTRAVENOUS
  Filled 2021-12-23: qty 7.5

## 2021-12-23 MED FILL — Dexamethasone Sodium Phosphate Inj 100 MG/10ML: INTRAMUSCULAR | Qty: 1 | Status: AC

## 2021-12-24 ENCOUNTER — Inpatient Hospital Stay: Payer: PPO

## 2021-12-24 ENCOUNTER — Other Ambulatory Visit: Payer: Self-pay

## 2021-12-24 ENCOUNTER — Ambulatory Visit
Admission: RE | Admit: 2021-12-24 | Discharge: 2021-12-24 | Disposition: A | Discharge status: Home / Self Care | Payer: PPO | Source: Ambulatory Visit | Attending: Radiation Oncology | Admitting: Radiation Oncology

## 2021-12-24 VITALS — BP 120/67 | HR 69 | Temp 97.8°F

## 2021-12-24 DIAGNOSIS — C3412 Malignant neoplasm of upper lobe, left bronchus or lung: Secondary | ICD-10-CM | POA: Diagnosis not present

## 2021-12-24 DIAGNOSIS — Z510 Encounter for antineoplastic radiation therapy: Secondary | ICD-10-CM | POA: Diagnosis not present

## 2021-12-24 DIAGNOSIS — Z87891 Personal history of nicotine dependence: Secondary | ICD-10-CM | POA: Diagnosis not present

## 2021-12-24 DIAGNOSIS — Z5111 Encounter for antineoplastic chemotherapy: Secondary | ICD-10-CM | POA: Diagnosis not present

## 2021-12-24 LAB — RAD ONC ARIA SESSION SUMMARY
Course Elapsed Days: 29
Plan Fractions Treated to Date: 22
Plan Prescribed Dose Per Fraction: 2 Gy
Plan Total Fractions Prescribed: 30
Plan Total Prescribed Dose: 60 Gy
Reference Point Dosage Given to Date: 44 Gy
Reference Point Session Dosage Given: 2 Gy
Session Number: 22

## 2021-12-24 MED ORDER — SODIUM CHLORIDE 0.9 % IV SOLN
80.0000 mg/m2 | Freq: Once | INTRAVENOUS | Status: AC
  Administered 2021-12-24: 150 mg via INTRAVENOUS
  Filled 2021-12-24: qty 7.5

## 2021-12-24 MED ORDER — SODIUM CHLORIDE 0.9 % IV SOLN: Freq: Once | INTRAVENOUS | Status: AC

## 2021-12-24 MED ORDER — SODIUM CHLORIDE 0.9 % IV SOLN
10.0000 mg | Freq: Once | INTRAVENOUS | Status: AC
  Administered 2021-12-24: 10 mg via INTRAVENOUS
  Filled 2021-12-24: qty 10

## 2021-12-25 ENCOUNTER — Ambulatory Visit
Admission: RE | Admit: 2021-12-25 | Discharge: 2021-12-25 | Disposition: A | Discharge status: Home / Self Care | Payer: PPO | Source: Ambulatory Visit | Attending: Radiation Oncology | Admitting: Radiation Oncology

## 2021-12-25 ENCOUNTER — Other Ambulatory Visit: Payer: Self-pay

## 2021-12-25 DIAGNOSIS — Z87891 Personal history of nicotine dependence: Secondary | ICD-10-CM | POA: Diagnosis not present

## 2021-12-25 DIAGNOSIS — Z5111 Encounter for antineoplastic chemotherapy: Secondary | ICD-10-CM | POA: Diagnosis not present

## 2021-12-25 DIAGNOSIS — C3412 Malignant neoplasm of upper lobe, left bronchus or lung: Secondary | ICD-10-CM | POA: Diagnosis not present

## 2021-12-25 DIAGNOSIS — Z510 Encounter for antineoplastic radiation therapy: Secondary | ICD-10-CM | POA: Diagnosis not present

## 2021-12-25 LAB — RAD ONC ARIA SESSION SUMMARY
Course Elapsed Days: 30
Plan Fractions Treated to Date: 23
Plan Prescribed Dose Per Fraction: 2 Gy
Plan Total Fractions Prescribed: 30
Plan Total Prescribed Dose: 60 Gy
Reference Point Dosage Given to Date: 46 Gy
Reference Point Session Dosage Given: 2 Gy
Session Number: 23

## 2021-12-26 ENCOUNTER — Ambulatory Visit
Admission: RE | Admit: 2021-12-26 | Discharge: 2021-12-26 | Disposition: A | Discharge status: Home / Self Care | Payer: PPO | Source: Ambulatory Visit | Attending: Radiation Oncology | Admitting: Radiation Oncology

## 2021-12-26 ENCOUNTER — Other Ambulatory Visit: Payer: Self-pay | Admitting: Internal Medicine

## 2021-12-26 ENCOUNTER — Other Ambulatory Visit: Payer: Self-pay

## 2021-12-26 DIAGNOSIS — Z87891 Personal history of nicotine dependence: Secondary | ICD-10-CM | POA: Diagnosis not present

## 2021-12-26 DIAGNOSIS — C3412 Malignant neoplasm of upper lobe, left bronchus or lung: Secondary | ICD-10-CM | POA: Diagnosis not present

## 2021-12-26 DIAGNOSIS — Z5111 Encounter for antineoplastic chemotherapy: Secondary | ICD-10-CM | POA: Diagnosis not present

## 2021-12-26 DIAGNOSIS — Z510 Encounter for antineoplastic radiation therapy: Secondary | ICD-10-CM | POA: Diagnosis not present

## 2021-12-26 LAB — RAD ONC ARIA SESSION SUMMARY
Course Elapsed Days: 31
Plan Fractions Treated to Date: 24
Plan Prescribed Dose Per Fraction: 2 Gy
Plan Total Fractions Prescribed: 30
Plan Total Prescribed Dose: 60 Gy
Reference Point Dosage Given to Date: 48 Gy
Reference Point Session Dosage Given: 2 Gy
Session Number: 24

## 2021-12-29 ENCOUNTER — Ambulatory Visit
Admission: RE | Admit: 2021-12-29 | Discharge: 2021-12-29 | Disposition: A | Discharge status: Home / Self Care | Payer: PPO | Source: Ambulatory Visit | Attending: Radiation Oncology | Admitting: Radiation Oncology

## 2021-12-29 ENCOUNTER — Ambulatory Visit: Payer: PPO

## 2021-12-29 ENCOUNTER — Other Ambulatory Visit: Payer: Self-pay

## 2021-12-29 ENCOUNTER — Inpatient Hospital Stay: Payer: PPO | Attending: Internal Medicine

## 2021-12-29 DIAGNOSIS — Z923 Personal history of irradiation: Secondary | ICD-10-CM | POA: Insufficient documentation

## 2021-12-29 DIAGNOSIS — K208 Other esophagitis without bleeding: Secondary | ICD-10-CM | POA: Insufficient documentation

## 2021-12-29 DIAGNOSIS — C3412 Malignant neoplasm of upper lobe, left bronchus or lung: Secondary | ICD-10-CM | POA: Diagnosis not present

## 2021-12-29 DIAGNOSIS — Z87891 Personal history of nicotine dependence: Secondary | ICD-10-CM | POA: Diagnosis not present

## 2021-12-29 DIAGNOSIS — Y842 Radiological procedure and radiotherapy as the cause of abnormal reaction of the patient, or of later complication, without mention of misadventure at the time of the procedure: Secondary | ICD-10-CM | POA: Insufficient documentation

## 2021-12-29 DIAGNOSIS — Z510 Encounter for antineoplastic radiation therapy: Secondary | ICD-10-CM | POA: Diagnosis not present

## 2021-12-29 DIAGNOSIS — D709 Neutropenia, unspecified: Secondary | ICD-10-CM | POA: Insufficient documentation

## 2021-12-29 DIAGNOSIS — Z5111 Encounter for antineoplastic chemotherapy: Secondary | ICD-10-CM | POA: Insufficient documentation

## 2021-12-29 LAB — CBC WITH DIFFERENTIAL (CANCER CENTER ONLY)
Abs Immature Granulocytes: 0.15 10*3/uL — ABNORMAL HIGH (ref 0.00–0.07)
Basophils Absolute: 0 10*3/uL (ref 0.0–0.1)
Basophils Relative: 1 %
Eosinophils Absolute: 0 10*3/uL (ref 0.0–0.5)
Eosinophils Relative: 0 %
HCT: 26.9 % — ABNORMAL LOW (ref 39.0–52.0)
Hemoglobin: 9.7 g/dL — ABNORMAL LOW (ref 13.0–17.0)
Immature Granulocytes: 5 %
Lymphocytes Relative: 16 %
Lymphs Abs: 0.5 10*3/uL — ABNORMAL LOW (ref 0.7–4.0)
MCH: 33.1 pg (ref 26.0–34.0)
MCHC: 36.1 g/dL — ABNORMAL HIGH (ref 30.0–36.0)
MCV: 91.8 fL (ref 80.0–100.0)
Monocytes Absolute: 0.1 10*3/uL (ref 0.1–1.0)
Monocytes Relative: 3 %
Neutro Abs: 2.3 10*3/uL (ref 1.7–7.7)
Neutrophils Relative %: 75 %
Platelet Count: 109 10*3/uL — ABNORMAL LOW (ref 150–400)
RBC: 2.93 MIL/uL — ABNORMAL LOW (ref 4.22–5.81)
RDW: 13.5 % (ref 11.5–15.5)
WBC Count: 3.1 10*3/uL — ABNORMAL LOW (ref 4.0–10.5)
nRBC: 0 % (ref 0.0–0.2)

## 2021-12-29 LAB — CMP (CANCER CENTER ONLY)
ALT: 11 U/L (ref 0–44)
AST: 12 U/L — ABNORMAL LOW (ref 15–41)
Albumin: 3.5 g/dL (ref 3.5–5.0)
Alkaline Phosphatase: 62 U/L (ref 38–126)
Anion gap: 6 (ref 5–15)
BUN: 13 mg/dL (ref 8–23)
CO2: 25 mmol/L (ref 22–32)
Calcium: 8.8 mg/dL — ABNORMAL LOW (ref 8.9–10.3)
Chloride: 99 mmol/L (ref 98–111)
Creatinine: 0.76 mg/dL (ref 0.61–1.24)
GFR, Estimated: >60 mL/min (ref >60)
Glucose, Bld: 161 mg/dL — ABNORMAL HIGH (ref 70–99)
Potassium: 3.8 mmol/L (ref 3.5–5.1)
Sodium: 130 mmol/L — ABNORMAL LOW (ref 135–145)
Total Bilirubin: 0.5 mg/dL (ref 0.3–1.2)
Total Protein: 5.8 g/dL — ABNORMAL LOW (ref 6.5–8.1)

## 2021-12-29 LAB — RAD ONC ARIA SESSION SUMMARY
Course Elapsed Days: 34
Plan Fractions Treated to Date: 25
Plan Prescribed Dose Per Fraction: 2 Gy
Plan Total Fractions Prescribed: 30
Plan Total Prescribed Dose: 60 Gy
Reference Point Dosage Given to Date: 50 Gy
Reference Point Session Dosage Given: 2 Gy
Session Number: 25

## 2021-12-31 ENCOUNTER — Other Ambulatory Visit: Payer: Self-pay

## 2021-12-31 ENCOUNTER — Ambulatory Visit
Admission: RE | Admit: 2021-12-31 | Discharge: 2021-12-31 | Disposition: A | Discharge status: Home / Self Care | Payer: PPO | Source: Ambulatory Visit | Attending: Radiation Oncology | Admitting: Radiation Oncology

## 2021-12-31 DIAGNOSIS — C3412 Malignant neoplasm of upper lobe, left bronchus or lung: Secondary | ICD-10-CM | POA: Diagnosis not present

## 2021-12-31 DIAGNOSIS — Z510 Encounter for antineoplastic radiation therapy: Secondary | ICD-10-CM | POA: Diagnosis not present

## 2021-12-31 DIAGNOSIS — Z87891 Personal history of nicotine dependence: Secondary | ICD-10-CM | POA: Diagnosis not present

## 2021-12-31 LAB — RAD ONC ARIA SESSION SUMMARY
Course Elapsed Days: 36
Plan Fractions Treated to Date: 26
Plan Prescribed Dose Per Fraction: 2 Gy
Plan Total Fractions Prescribed: 30
Plan Total Prescribed Dose: 60 Gy
Reference Point Dosage Given to Date: 52 Gy
Reference Point Session Dosage Given: 2 Gy
Session Number: 26

## 2022-01-01 ENCOUNTER — Ambulatory Visit
Admission: RE | Admit: 2022-01-01 | Discharge: 2022-01-01 | Disposition: A | Discharge status: Home / Self Care | Payer: PPO | Source: Ambulatory Visit | Attending: Radiation Oncology | Admitting: Radiation Oncology

## 2022-01-01 ENCOUNTER — Other Ambulatory Visit: Payer: Self-pay

## 2022-01-01 DIAGNOSIS — Z510 Encounter for antineoplastic radiation therapy: Secondary | ICD-10-CM | POA: Diagnosis not present

## 2022-01-01 DIAGNOSIS — Z87891 Personal history of nicotine dependence: Secondary | ICD-10-CM | POA: Diagnosis not present

## 2022-01-01 DIAGNOSIS — C3412 Malignant neoplasm of upper lobe, left bronchus or lung: Secondary | ICD-10-CM | POA: Diagnosis not present

## 2022-01-01 LAB — RAD ONC ARIA SESSION SUMMARY
Course Elapsed Days: 37
Plan Fractions Treated to Date: 27
Plan Prescribed Dose Per Fraction: 2 Gy
Plan Total Fractions Prescribed: 30
Plan Total Prescribed Dose: 60 Gy
Reference Point Dosage Given to Date: 54 Gy
Reference Point Session Dosage Given: 2 Gy
Session Number: 27

## 2022-01-02 ENCOUNTER — Other Ambulatory Visit: Payer: Self-pay | Admitting: Internal Medicine

## 2022-01-02 ENCOUNTER — Ambulatory Visit
Admission: RE | Admit: 2022-01-02 | Discharge: 2022-01-02 | Disposition: A | Discharge status: Home / Self Care | Payer: PPO | Source: Ambulatory Visit | Attending: Radiation Oncology | Admitting: Radiation Oncology

## 2022-01-02 ENCOUNTER — Other Ambulatory Visit: Payer: Self-pay

## 2022-01-02 DIAGNOSIS — C3412 Malignant neoplasm of upper lobe, left bronchus or lung: Secondary | ICD-10-CM | POA: Diagnosis not present

## 2022-01-02 DIAGNOSIS — Z87891 Personal history of nicotine dependence: Secondary | ICD-10-CM | POA: Diagnosis not present

## 2022-01-02 DIAGNOSIS — Z510 Encounter for antineoplastic radiation therapy: Secondary | ICD-10-CM | POA: Diagnosis not present

## 2022-01-02 LAB — RAD ONC ARIA SESSION SUMMARY
Course Elapsed Days: 38
Plan Fractions Treated to Date: 28
Plan Prescribed Dose Per Fraction: 2 Gy
Plan Total Fractions Prescribed: 30
Plan Total Prescribed Dose: 60 Gy
Reference Point Dosage Given to Date: 56 Gy
Reference Point Session Dosage Given: 2 Gy
Session Number: 28

## 2022-01-05 ENCOUNTER — Inpatient Hospital Stay: Payer: PPO

## 2022-01-05 ENCOUNTER — Other Ambulatory Visit: Payer: Self-pay

## 2022-01-05 ENCOUNTER — Ambulatory Visit
Admission: RE | Admit: 2022-01-05 | Discharge: 2022-01-05 | Disposition: A | Discharge status: Home / Self Care | Payer: PPO | Source: Ambulatory Visit | Attending: Radiation Oncology | Admitting: Radiation Oncology

## 2022-01-05 DIAGNOSIS — Z510 Encounter for antineoplastic radiation therapy: Secondary | ICD-10-CM | POA: Diagnosis not present

## 2022-01-05 DIAGNOSIS — C3412 Malignant neoplasm of upper lobe, left bronchus or lung: Secondary | ICD-10-CM

## 2022-01-05 DIAGNOSIS — Z87891 Personal history of nicotine dependence: Secondary | ICD-10-CM | POA: Diagnosis not present

## 2022-01-05 LAB — CMP (CANCER CENTER ONLY)
ALT: 10 U/L (ref 0–44)
AST: 12 U/L — ABNORMAL LOW (ref 15–41)
Albumin: 3.7 g/dL (ref 3.5–5.0)
Alkaline Phosphatase: 68 U/L (ref 38–126)
Anion gap: 5 (ref 5–15)
BUN: 9 mg/dL (ref 8–23)
CO2: 27 mmol/L (ref 22–32)
Calcium: 8.9 mg/dL (ref 8.9–10.3)
Chloride: 100 mmol/L (ref 98–111)
Creatinine: 0.88 mg/dL (ref 0.61–1.24)
GFR, Estimated: >60 mL/min (ref >60)
Glucose, Bld: 143 mg/dL — ABNORMAL HIGH (ref 70–99)
Potassium: 3.9 mmol/L (ref 3.5–5.1)
Sodium: 132 mmol/L — ABNORMAL LOW (ref 135–145)
Total Bilirubin: 0.8 mg/dL (ref 0.3–1.2)
Total Protein: 6.1 g/dL — ABNORMAL LOW (ref 6.5–8.1)

## 2022-01-05 LAB — CBC WITH DIFFERENTIAL (CANCER CENTER ONLY)
Abs Immature Granulocytes: 0 10*3/uL (ref 0.00–0.07)
Basophils Absolute: 0 10*3/uL (ref 0.0–0.1)
Basophils Relative: 1 %
Eosinophils Absolute: 0 10*3/uL (ref 0.0–0.5)
Eosinophils Relative: 1 %
HCT: 26.8 % — ABNORMAL LOW (ref 39.0–52.0)
Hemoglobin: 9.7 g/dL — ABNORMAL LOW (ref 13.0–17.0)
Immature Granulocytes: 0 %
Lymphocytes Relative: 33 %
Lymphs Abs: 0.3 10*3/uL — ABNORMAL LOW (ref 0.7–4.0)
MCH: 32.9 pg (ref 26.0–34.0)
MCHC: 36.2 g/dL — ABNORMAL HIGH (ref 30.0–36.0)
MCV: 90.8 fL (ref 80.0–100.0)
Monocytes Absolute: 0.2 10*3/uL (ref 0.1–1.0)
Monocytes Relative: 19 %
Neutro Abs: 0.5 10*3/uL — ABNORMAL LOW (ref 1.7–7.7)
Neutrophils Relative %: 46 %
Platelet Count: 86 10*3/uL — ABNORMAL LOW (ref 150–400)
RBC: 2.95 MIL/uL — ABNORMAL LOW (ref 4.22–5.81)
RDW: 14.7 % (ref 11.5–15.5)
Smear Review: NORMAL
WBC Count: 1 10*3/uL — ABNORMAL LOW (ref 4.0–10.5)
nRBC: 0 % (ref 0.0–0.2)

## 2022-01-05 LAB — RAD ONC ARIA SESSION SUMMARY
Course Elapsed Days: 41
Plan Fractions Treated to Date: 29
Plan Prescribed Dose Per Fraction: 2 Gy
Plan Total Fractions Prescribed: 30
Plan Total Prescribed Dose: 60 Gy
Reference Point Dosage Given to Date: 58 Gy
Reference Point Session Dosage Given: 2 Gy
Session Number: 29

## 2022-01-05 LAB — MAGNESIUM: Magnesium: 1.8 mg/dL (ref 1.7–2.4)

## 2022-01-06 ENCOUNTER — Ambulatory Visit
Admission: RE | Admit: 2022-01-06 | Discharge: 2022-01-06 | Disposition: A | Discharge status: Home / Self Care | Payer: PPO | Source: Ambulatory Visit | Attending: Radiation Oncology | Admitting: Radiation Oncology

## 2022-01-06 ENCOUNTER — Other Ambulatory Visit: Payer: Self-pay

## 2022-01-06 ENCOUNTER — Ambulatory Visit: Payer: PPO

## 2022-01-06 ENCOUNTER — Encounter: Payer: Self-pay | Admitting: Radiation Oncology

## 2022-01-06 DIAGNOSIS — Z87891 Personal history of nicotine dependence: Secondary | ICD-10-CM | POA: Diagnosis not present

## 2022-01-06 DIAGNOSIS — Z510 Encounter for antineoplastic radiation therapy: Secondary | ICD-10-CM | POA: Diagnosis not present

## 2022-01-06 DIAGNOSIS — C3412 Malignant neoplasm of upper lobe, left bronchus or lung: Secondary | ICD-10-CM | POA: Diagnosis not present

## 2022-01-06 LAB — RAD ONC ARIA SESSION SUMMARY
Course Elapsed Days: 42
Plan Fractions Treated to Date: 30
Plan Prescribed Dose Per Fraction: 2 Gy
Plan Total Fractions Prescribed: 30
Plan Total Prescribed Dose: 60 Gy
Reference Point Dosage Given to Date: 60 Gy
Reference Point Session Dosage Given: 2 Gy
Session Number: 30

## 2022-01-07 ENCOUNTER — Encounter: Payer: Self-pay | Admitting: Internal Medicine

## 2022-01-07 NOTE — [facility] (Signed)
[facility] OFFICE PROGRESS NOTE  Jonathan Noble, MD [address] [place] [address]  DIAGNOSIS:  Limited stage (T1c, N2, M0) small cell lung cancer presented with left upper lobe lung mass in addition to left hilar and mediastinal lymphadenopathy diagnosed in April 2023.  PRIOR THERAPY: None  CURRENT THERAPY:  Systemic chemotherapy with cisplatin 75 Mg/M2 on day 1 and 2 etoposide 100 Mg/M2 on days 1, 2 and 3 every 3 weeks.  First cycle 11/10/2021.  This is concurrent with radiotherapy.  Status post 3 cycles.  INTERVAL HISTORY: Jonathan Castro 66 y.o. male returns to the clinic today for a follow-up visit.  The patient is currently undergoing treatment with concurrent chemoradiation.  He completed his course of radiation.  He had some radiation induced esophagitis from his radiation therapy which is slowly improving.  He has started back eating more a few days ago.  He still lost 3 pounds.  He drinks approximately 1 protein shake per day. He is status post 3 cycles of chemotherapy.  He has tolerated it very well except for neutropenia and some fatigue/weakness compared to his baseline.  He had some neutropenia on weekly labs last week. He denies any signs and symptoms of infection including fever, chills, ear infections, nasal congestion, sore throat, skin infections, abdominal pain, or dysuria.  The patient denies any night sweats. Denies any chest pain, hemoptysis, shortness of breath, or cough.  The patient denies any nausea, vomiting, diarrhea, or constipation.  Denies any headache or visual changes.  Denies any peripheral neuropathy.  The patient is here today for evaluation and repeat blood work before undergoing his last cycle of treatment. Marland Kitchen    MEDICAL HISTORY: Past Medical History:  Diagnosis Date   Arthritis    Cancer ([facility])    COPD (chronic obstructive pulmonary disease) ([facility])     ALLERGIES:  is allergic to penicillamine, penicillins, and  leflunomide.  MEDICATIONS:  Current Outpatient Medications  Medication Sig Dispense Refill   acetaminophen (TYLENOL) 650 MG CR tablet Take 1,300 mg by mouth daily.     albuterol (VENTOLIN HFA) 108 (90 Base) MCG/ACT inhaler Inhale 2 puffs into the lungs every 6 (six) hours as needed for wheezing or shortness of breath.     chlorproMAZINE (THORAZINE) 25 MG tablet TAKE ONE TABLET (25MG  TOTAL) BY MOUTH THREE TIMES DAILY 30 tablet 1   fexofenadine-pseudoephedrine (ALLEGRA-D) 60-120 MG 12 hr tablet Take 1 tablet by mouth 2 (two) times daily as needed (allergies).     folic acid (FOLVITE) 1 MG tablet Take 1 mg by mouth daily.     lidocaine (XYLOCAINE) 2 % solution Use as directed 15 mLs in the mouth or throat as needed for mouth pain. [place] per instructions given by nursing, swallow 20 min prior to meals and bedtime 100 mL 2   oxyCODONE-acetaminophen (PERCOCET/ROXICET) 5-325 MG tablet Take 1 tablet by mouth every 8 (eight) hours as needed for severe pain. 30 tablet 0   predniSONE (DELTASONE) 5 MG tablet Take 5 mg by mouth daily as needed (arthritis flare).     prochlorperazine (COMPAZINE) 10 MG tablet Take 1 tablet (10 mg total) by mouth every 6 (six) hours as needed for nausea or vomiting. 30 tablet 0   sucralfate (CARAFATE) 1 g tablet Take 1 tablet (1 g total) by mouth 4 (four) times daily -  with meals and at bedtime. Crush and dissolve in 10 mL of warm water prior to swallowing 120 tablet 1   methotrexate  50 MG/2ML injection Inject 15 mg into the vein every Wednesday. (Patient not taking: Reported on 11/17/2021)     No current facility-administered medications for this visit.    SURGICAL HISTORY:  Past Surgical History:  Procedure Laterality Date   MEDIASTINOSCOPY N/A 10/20/2021   Procedure: MEDIASTINOSCOPY;  Surgeon: Melrose Nakayama, MD;  Location[facility];  Service: Thoracic;  Laterality: N/A;   ROTATOR CUFF REPAIR Left    VIDEO BRONCHOSCOPY WITH ENDOBRONCHIAL ULTRASOUND N/A 10/20/2021    Procedure: VIDEO BRONCHOSCOPY WITH ENDOBRONCHIAL ULTRASOUND WITH BIOPSIES;  Surgeon: Melrose Nakayama, MD;  Location: [facility];  Service: Thoracic;  Laterality: N/A;    REVIEW OF SYSTEMS:   Review of Systems  Constitutional: Positive for improving but persistent decreased appetite and weight loss.  Positive for fatigue and generalized weakness.  Negative for chills and fever.  HENT: Positive for hoarseness and improving but persistent radiation esophagitis.  Negative for mouth sores and nosebleeds.   Eyes: Negative for eye problems and icterus.  Respiratory: Negative for cough, hemoptysis, shortness of breath and wheezing.   Cardiovascular: Negative for chest pain and leg swelling.  Gastrointestinal: Negative for abdominal pain, constipation, diarrhea, nausea and vomiting.  Genitourinary: Negative for bladder incontinence, difficulty urinating, dysuria, frequency and hematuria.   Musculoskeletal: Negative for back pain, gait problem, neck pain and neck stiffness.  Skin: Negative for itching and rash.  Neurological: Negative for dizziness, extremity weakness, gait problem, headaches, light-headedness and seizures.  Hematological: Negative for adenopathy. Does not bruise/bleed easily.  Psychiatric/Behavioral: Negative for confusion, depression and sleep disturbance. The patient is not nervous/anxious.     PHYSICAL EXAMINATION:  Blood pressure 115/67, pulse 73, temperature 97.6 F (36.4 C), temperature source Oral, resp. rate 15, height 5\' 7"  (1.702 m), weight 155 lb 8 oz (70.5 kg), SpO2 99 %.  ECOG PERFORMANCE STATUS: 1  Physical Exam  Constitutional: Oriented to person, place, and time and well-developed, well-nourished, and in no distress.  HENT:  Head: Normocephalic and atraumatic.  Mouth/Throat: Positive for hoarseness.  Oropharynx is clear and moist. No oropharyngeal exudate.  Eyes: Conjunctivae are normal. Right eye exhibits no discharge. Left eye exhibits no discharge. No scleral  icterus.  Neck: Normal range of motion. Neck supple.  Cardiovascular: Normal rate, regular rhythm, normal heart sounds and intact distal pulses.   Pulmonary/Chest: Effort normal.  Quiet breath sounds in all lung fields.  No respiratory distress. No wheezes. No rales.  Abdominal: Soft. Bowel sounds are normal. Exhibits no distension and no mass. There is no tenderness.  Musculoskeletal: Normal range of motion. Exhibits no edema.  Lymphadenopathy:    No cervical adenopathy.  Neurological: Alert and oriented to person, place, and time. Exhibits normal muscle tone. Gait normal. Coordination normal.  Skin: Skin is warm and dry. No rash noted. Not diaphoretic. No erythema. No pallor.  Psychiatric: Mood, memory and judgment normal.  Vitals reviewed.  LABORATORY DATA: Lab Results  Component Value Date   WBC 6.1 01/13/2022   HGB 9.6 (L) 01/13/2022   HCT 27.4 (L) 01/13/2022   MCV 93.8 01/13/2022   PLT 236 01/13/2022      Chemistry      Component Value Date/Time   NA 135 01/13/2022 0841   K 3.4 (L) 01/13/2022 0841   CL 102 01/13/2022 0841   CO2 29 01/13/2022 0841   BUN 7 (L) 01/13/2022 0841   CREATININE 0.85 01/13/2022 0841      Component Value Date/Time   CALCIUM 9.0 01/13/2022 0841   ALKPHOS 59  01/13/2022 0841   AST 15 01/13/2022 0841   ALT 17 01/13/2022 0841   BILITOT 0.4 01/13/2022 0841       RADIOGRAPHIC STUDIES:  No results found.   ASSESSMENT/PLAN:  This is a very pleasant 66 year old Caucasian male diagnosed with limited stage (T1c, N2, M0) small cell lung cancer.  The patient presented with a left upper lobe lung mass in addition to a left hilar and mediastinal lymphadenopathy.  He was diagnosed in April 2023.  The patient is currently undergoing a course of systemic chemotherapy with cisplatin 75 mg per metered squared on day 1 and etoposide 100 mg per metered squared on days 1, 2, and 3 IV every 3 weeks.  He is status post 3 cycles.  The patient completed  concurrent radiation.  He has 1 month follow-up visit with Dr. Sondra Come on 02/09/2022.  The patient has been tolerating treatment very well except for neutropenia and fatigue/weakness.     Labs were reviewed.  Recommend that he proceed with his last cycle of treatment today as scheduled.   I will arrange for restaging CT scan of the chest prior to his next appointment.  We will see him back for follow-up visit in 3 weeks for evaluation to review his scan results and for more detailed discussion about his current condition.  We will continue to monitor his labs closely on a weekly basis and arrange for G-CSF injection if needed.  He did receive G-CSF injections a few weeks ago.  Discussed that it is always an option if he would like to wait 10 minutes in the lobby for his results before knowing if he is okay to leave or if he is requiring an injection.  I just asked that he notify staff member if he elects to stay.   Discussed with the patient and his daughter that we will likely discuss PCI at his next appointment.  His daughter is also wondering when it safe to resume methotrexate.  Discussed that we can review this in more depth at his next appointment.  Encouraged the patient to continue eating and drinking plenty of fluid and protein drinks.  His potassium is slightly low at 3.4.  He was given a handout of potassium rich food and encouraged to increase his citrate intake of potassium rich food.  His swallowing from his radiation is slowly improving and he was able to eat more solid foods starting from a couple days ago.   The patient was advised to call immediately if she has any concerning symptoms in the interval. The patient voices understanding of current disease status and treatment options and is in agreement with the current care plan. All questions were answered. The patient knows to call the clinic with any problems, questions or concerns. We can certainly see the patient much sooner if  necessary       Orders Placed This Encounter  Procedures   CT Chest W Contrast    Standing Status:   Future    Standing Expiration Date:   01/13/2023    Order Specific Question:   If indicated for the ordered procedure, I authorize the administration of contrast media per Radiology protocol    Answer:   Yes    Order Specific Question:   Preferred imaging location?    Answer:   [facility]     The total time spent in the appointment was 20-29 minutes.   Gianni Mihalik L Naveen Clardy, PA-C 01/13/22

## 2022-01-09 ENCOUNTER — Encounter: Payer: Self-pay | Admitting: Internal Medicine

## 2022-01-12 ENCOUNTER — Other Ambulatory Visit: Payer: Self-pay | Admitting: Physician Assistant

## 2022-01-12 DIAGNOSIS — C3412 Malignant neoplasm of upper lobe, left bronchus or lung: Secondary | ICD-10-CM

## 2022-01-12 MED FILL — Dexamethasone Sodium Phosphate Inj 100 MG/10ML: INTRAMUSCULAR | Qty: 1 | Status: AC

## 2022-01-12 MED FILL — Fosaprepitant Dimeglumine For IV Infusion 150 MG (Base Eq): INTRAVENOUS | Qty: 5 | Status: AC

## 2022-01-13 ENCOUNTER — Other Ambulatory Visit: Payer: Self-pay

## 2022-01-13 ENCOUNTER — Inpatient Hospital Stay: Payer: PPO

## 2022-01-13 ENCOUNTER — Inpatient Hospital Stay (HOSPITAL_BASED_OUTPATIENT_CLINIC_OR_DEPARTMENT_OTHER): Payer: PPO | Admitting: Physician Assistant

## 2022-01-13 VITALS — BP 115/67 | HR 73 | Temp 97.6°F | Resp 15 | Ht 67.0 in | Wt 155.5 lb

## 2022-01-13 DIAGNOSIS — Z923 Personal history of irradiation: Secondary | ICD-10-CM | POA: Diagnosis not present

## 2022-01-13 DIAGNOSIS — C3412 Malignant neoplasm of upper lobe, left bronchus or lung: Secondary | ICD-10-CM

## 2022-01-13 DIAGNOSIS — Z5111 Encounter for antineoplastic chemotherapy: Secondary | ICD-10-CM | POA: Insufficient documentation

## 2022-01-13 DIAGNOSIS — D709 Neutropenia, unspecified: Secondary | ICD-10-CM | POA: Diagnosis not present

## 2022-01-13 DIAGNOSIS — Y842 Radiological procedure and radiotherapy as the cause of abnormal reaction of the patient, or of later complication, without mention of misadventure at the time of the procedure: Secondary | ICD-10-CM | POA: Diagnosis not present

## 2022-01-13 DIAGNOSIS — K208 Other esophagitis without bleeding: Secondary | ICD-10-CM | POA: Diagnosis not present

## 2022-01-13 LAB — CMP (CANCER CENTER ONLY)
ALT: 17 U/L (ref 0–44)
AST: 15 U/L (ref 15–41)
Albumin: 3.7 g/dL (ref 3.5–5.0)
Alkaline Phosphatase: 59 U/L (ref 38–126)
Anion gap: 4 — ABNORMAL LOW (ref 5–15)
BUN: 7 mg/dL — ABNORMAL LOW (ref 8–23)
CO2: 29 mmol/L (ref 22–32)
Calcium: 9 mg/dL (ref 8.9–10.3)
Chloride: 102 mmol/L (ref 98–111)
Creatinine: 0.85 mg/dL (ref 0.61–1.24)
GFR, Estimated: >60 mL/min (ref >60)
Glucose, Bld: 104 mg/dL — ABNORMAL HIGH (ref 70–99)
Potassium: 3.4 mmol/L — ABNORMAL LOW (ref 3.5–5.1)
Sodium: 135 mmol/L (ref 135–145)
Total Bilirubin: 0.4 mg/dL (ref 0.3–1.2)
Total Protein: 5.8 g/dL — ABNORMAL LOW (ref 6.5–8.1)

## 2022-01-13 LAB — CBC WITH DIFFERENTIAL (CANCER CENTER ONLY)
Abs Immature Granulocytes: 0.05 10*3/uL (ref 0.00–0.07)
Basophils Absolute: 0 10*3/uL (ref 0.0–0.1)
Basophils Relative: 0 %
Eosinophils Absolute: 0 10*3/uL (ref 0.0–0.5)
Eosinophils Relative: 0 %
HCT: 27.4 % — ABNORMAL LOW (ref 39.0–52.0)
Hemoglobin: 9.6 g/dL — ABNORMAL LOW (ref 13.0–17.0)
Immature Granulocytes: 1 %
Lymphocytes Relative: 7 %
Lymphs Abs: 0.5 10*3/uL — ABNORMAL LOW (ref 0.7–4.0)
MCH: 32.9 pg (ref 26.0–34.0)
MCHC: 35 g/dL (ref 30.0–36.0)
MCV: 93.8 fL (ref 80.0–100.0)
Monocytes Absolute: 0.7 10*3/uL (ref 0.1–1.0)
Monocytes Relative: 12 %
Neutro Abs: 4.8 10*3/uL (ref 1.7–7.7)
Neutrophils Relative %: 80 %
Platelet Count: 236 10*3/uL (ref 150–400)
RBC: 2.92 MIL/uL — ABNORMAL LOW (ref 4.22–5.81)
RDW: 16.5 % — ABNORMAL HIGH (ref 11.5–15.5)
WBC Count: 6.1 10*3/uL (ref 4.0–10.5)
nRBC: 0 % (ref 0.0–0.2)

## 2022-01-13 LAB — MAGNESIUM: Magnesium: 1.8 mg/dL (ref 1.7–2.4)

## 2022-01-13 MED ORDER — MAGNESIUM SULFATE 2 GM/50ML IV SOLN
2.0000 g | Freq: Once | INTRAVENOUS | Status: AC
  Administered 2022-01-13: 2 g via INTRAVENOUS
  Filled 2022-01-13: qty 50

## 2022-01-13 MED ORDER — SODIUM CHLORIDE 0.9 % IV SOLN
75.0000 mg/m2 | Freq: Once | INTRAVENOUS | Status: AC
  Administered 2022-01-13: 139 mg via INTRAVENOUS
  Filled 2022-01-13: qty 139

## 2022-01-13 MED ORDER — POTASSIUM CHLORIDE IN NACL 20-0.9 MEQ/L-% IV SOLN
Freq: Once | INTRAVENOUS | Status: AC
  Filled 2022-01-13: qty 1000

## 2022-01-13 MED ORDER — SODIUM CHLORIDE 0.9 % IV SOLN
100.0000 mg/m2 | Freq: Once | INTRAVENOUS | Status: AC
  Administered 2022-01-13: 190 mg via INTRAVENOUS
  Filled 2022-01-13: qty 9.5

## 2022-01-13 MED ORDER — SODIUM CHLORIDE 0.9 % IV SOLN: Freq: Once | INTRAVENOUS | Status: AC

## 2022-01-13 MED ORDER — PALONOSETRON HCL INJECTION 0.25 MG/5ML
0.2500 mg | Freq: Once | INTRAVENOUS | Status: AC
  Administered 2022-01-13: 0.25 mg via INTRAVENOUS
  Filled 2022-01-13: qty 5

## 2022-01-13 MED ORDER — SODIUM CHLORIDE 0.9 % IV SOLN
10.0000 mg | Freq: Once | INTRAVENOUS | Status: AC
  Administered 2022-01-13: 10 mg via INTRAVENOUS
  Filled 2022-01-13: qty 10

## 2022-01-13 MED ORDER — SODIUM CHLORIDE 0.9 % IV SOLN
150.0000 mg | Freq: Once | INTRAVENOUS | Status: AC
  Administered 2022-01-13: 150 mg via INTRAVENOUS
  Filled 2022-01-13: qty 150

## 2022-01-13 MED FILL — Dexamethasone Sodium Phosphate Inj 100 MG/10ML: INTRAMUSCULAR | Qty: 1 | Status: AC

## 2022-01-13 NOTE — Patient Instructions (Signed)
[facility] ONCOLOGY  Discharge Instructions: Thank you for choosing [facility] to provide your oncology and hematology care.   If you have a lab appointment with the [facility], please go directly to the [facility] and check in at the registration area.   Wear comfortable clothing and clothing appropriate for easy access to any Portacath or PICC line.   We strive to give you quality time with your provider. You may need to reschedule your appointment if you arrive late (15 or more minutes).  Arriving late affects you and other patients whose appointments are after yours.  Also, if you miss three or more appointments without notifying the office, you may be dismissed from the clinic at the provider's discretion.      For prescription refill requests, have your pharmacy contact our office and allow 72 hours for refills to be completed.    Today you received the following chemotherapy and/or immunotherapy agents: Etoposide, Cisplatin      To help prevent nausea and vomiting after your treatment, we encourage you to take your nausea medication as directed.  BELOW ARE SYMPTOMS THAT SHOULD BE REPORTED IMMEDIATELY: *FEVER GREATER THAN 100.4 F (38 C) OR HIGHER *CHILLS OR SWEATING *NAUSEA AND VOMITING THAT IS NOT CONTROLLED WITH YOUR NAUSEA MEDICATION *UNUSUAL SHORTNESS OF BREATH *UNUSUAL BRUISING OR BLEEDING *URINARY PROBLEMS (pain or burning when urinating, or frequent urination) *BOWEL PROBLEMS (unusual diarrhea, constipation, pain near the anus) TENDERNESS IN MOUTH AND THROAT WITH OR WITHOUT PRESENCE OF ULCERS (sore throat, sores in mouth, or a toothache) UNUSUAL RASH, SWELLING OR PAIN  UNUSUAL VAGINAL DISCHARGE OR ITCHING   Items with * indicate a potential emergency and should be followed up as soon as possible or go to the Emergency Department if any problems should occur.  Please show the CHEMOTHERAPY ALERT CARD or IMMUNOTHERAPY ALERT CARD at  check-in to the Emergency Department and triage nurse.  Should you have questions after your visit or need to cancel or reschedule your appointment, please contact [facility]  Dept: [phone]  and follow the prompts.  Office hours are 8:00 a.m. to 4:30 p.m. Monday - Friday. Please note that voicemails left after 4:00 p.m. may not be returned until the following business day.  We are closed weekends and major holidays. You have access to a nurse at all times for urgent questions. Please call the main number to the clinic Dept: [phone] and follow the prompts.   For any non-urgent questions, you may also contact your provider using MyChart. We now offer e-Visits for anyone 5 and older to request care online for non-urgent symptoms. For details visit [email or web address].   Also download the MyChart app! Go to the app store, search "MyChart", open the app, select [facility], and log in with your MyChart username and password.  Due to Covid, a mask is required upon entering the hospital/clinic. If you do not have a mask, one will be given to you upon arrival. For doctor visits, patients may have 1 support Jonathan Castro aged 67 or older with them. For treatment visits, patients cannot have anyone with them due to current Covid guidelines and our immunocompromised population.

## 2022-01-14 ENCOUNTER — Inpatient Hospital Stay: Payer: PPO

## 2022-01-14 ENCOUNTER — Telehealth: Payer: Self-pay | Admitting: Physician Assistant

## 2022-01-14 VITALS — BP 118/70 | HR 68 | Temp 98.0°F | Resp 18

## 2022-01-14 DIAGNOSIS — Z5111 Encounter for antineoplastic chemotherapy: Secondary | ICD-10-CM | POA: Diagnosis not present

## 2022-01-14 DIAGNOSIS — C3412 Malignant neoplasm of upper lobe, left bronchus or lung: Secondary | ICD-10-CM

## 2022-01-14 MED ORDER — SODIUM CHLORIDE 0.9 % IV SOLN
10.0000 mg | Freq: Once | INTRAVENOUS | Status: AC
  Administered 2022-01-14: 10 mg via INTRAVENOUS
  Filled 2022-01-14: qty 10

## 2022-01-14 MED ORDER — SODIUM CHLORIDE 0.9 % IV SOLN
100.0000 mg/m2 | Freq: Once | INTRAVENOUS | Status: AC
  Administered 2022-01-14: 190 mg via INTRAVENOUS
  Filled 2022-01-14: qty 9.5

## 2022-01-14 MED ORDER — SODIUM CHLORIDE 0.9 % IV SOLN: Freq: Once | INTRAVENOUS | Status: AC

## 2022-01-14 MED FILL — Dexamethasone Sodium Phosphate Inj 100 MG/10ML: INTRAMUSCULAR | Qty: 1 | Status: AC

## 2022-01-14 NOTE — Telephone Encounter (Signed)
Scheduled per 07/18 los, patient has been called and voicemail was left.

## 2022-01-14 NOTE — [facility] Notes (Signed)
Confirmed Etoposide dose increase back to 100 mg/m2 w/ Cassie Heilingoetter, PA.  Kennith Center, Pharm.D., CPP 01/14/2022@1 :24 PM

## 2022-01-14 NOTE — Patient Instructions (Signed)
[facility] ONCOLOGY  Discharge Instructions: Thank you for choosing [facility] to provide your oncology and hematology care.   If you have a lab appointment with the [facility], please go directly to the [facility] and check in at the registration area.   Wear comfortable clothing and clothing appropriate for easy access to any Portacath or PICC line.   We strive to give you quality time with your provider. You may need to reschedule your appointment if you arrive late (15 or more minutes).  Arriving late affects you and other patients whose appointments are after yours.  Also, if you miss three or more appointments without notifying the office, you may be dismissed from the clinic at the provider's discretion.      For prescription refill requests, have your pharmacy contact our office and allow 72 hours for refills to be completed.    Today you received the following chemotherapy and/or immunotherapy agents Etoposide      To help prevent nausea and vomiting after your treatment, we encourage you to take your nausea medication as directed.  BELOW ARE SYMPTOMS THAT SHOULD BE REPORTED IMMEDIATELY: *FEVER GREATER THAN 100.4 F (38 C) OR HIGHER *CHILLS OR SWEATING *NAUSEA AND VOMITING THAT IS NOT CONTROLLED WITH YOUR NAUSEA MEDICATION *UNUSUAL SHORTNESS OF BREATH *UNUSUAL BRUISING OR BLEEDING *URINARY PROBLEMS (pain or burning when urinating, or frequent urination) *BOWEL PROBLEMS (unusual diarrhea, constipation, pain near the anus) TENDERNESS IN MOUTH AND THROAT WITH OR WITHOUT PRESENCE OF ULCERS (sore throat, sores in mouth, or a toothache) UNUSUAL RASH, SWELLING OR PAIN  UNUSUAL VAGINAL DISCHARGE OR ITCHING   Items with * indicate a potential emergency and should be followed up as soon as possible or go to the Emergency Department if any problems should occur.  Please show the CHEMOTHERAPY ALERT CARD or IMMUNOTHERAPY ALERT CARD at check-in to  the Emergency Department and triage nurse.  Should you have questions after your visit or need to cancel or reschedule your appointment, please contact [facility]  Dept: [phone]  and follow the prompts.  Office hours are 8:00 a.m. to 4:30 p.m. Monday - Friday. Please note that voicemails left after 4:00 p.m. may not be returned until the following business day.  We are closed weekends and major holidays. You have access to a nurse at all times for urgent questions. Please call the main number to the clinic Dept: [phone] and follow the prompts.   For any non-urgent questions, you may also contact your provider using MyChart. We now offer e-Visits for anyone 78 and older to request care online for non-urgent symptoms. For details visit [email or web address].   Also download the MyChart app! Go to the app store, search "MyChart", open the app, select [facility], and log in with your MyChart username and password.  Due to Covid, a mask is required upon entering the hospital/clinic. If you do not have a mask, one will be given to you upon arrival. For doctor visits, patients may have 1 support person aged 61 or older with them. For treatment visits, patients cannot have anyone with them due to current Covid guidelines and our immunocompromised population.

## 2022-01-15 ENCOUNTER — Encounter: Payer: Self-pay | Admitting: Internal Medicine

## 2022-01-15 ENCOUNTER — Other Ambulatory Visit: Payer: Self-pay

## 2022-01-15 ENCOUNTER — Inpatient Hospital Stay: Payer: PPO

## 2022-01-15 VITALS — BP 124/73 | HR 73 | Temp 97.7°F | Resp 18

## 2022-01-15 DIAGNOSIS — C3412 Malignant neoplasm of upper lobe, left bronchus or lung: Secondary | ICD-10-CM

## 2022-01-15 DIAGNOSIS — Z5111 Encounter for antineoplastic chemotherapy: Secondary | ICD-10-CM | POA: Diagnosis not present

## 2022-01-15 MED ORDER — SODIUM CHLORIDE 0.9 % IV SOLN
10.0000 mg | Freq: Once | INTRAVENOUS | Status: AC
  Administered 2022-01-15: 10 mg via INTRAVENOUS
  Filled 2022-01-15: qty 10

## 2022-01-15 MED ORDER — SODIUM CHLORIDE 0.9 % IV SOLN: Freq: Once | INTRAVENOUS | Status: AC

## 2022-01-15 MED ORDER — SODIUM CHLORIDE 0.9 % IV SOLN
100.0000 mg/m2 | Freq: Once | INTRAVENOUS | Status: AC
  Administered 2022-01-15: 190 mg via INTRAVENOUS
  Filled 2022-01-15: qty 9.5

## 2022-01-15 NOTE — Patient Instructions (Signed)
[facility] ONCOLOGY  Discharge Instructions: Thank you for choosing [facility] to provide your oncology and hematology care.   If you have a lab appointment with the [facility], please go directly to the [facility] and check in at the registration area.   Wear comfortable clothing and clothing appropriate for easy access to any Portacath or PICC line.   We strive to give you quality time with your provider. You may need to reschedule your appointment if you arrive late (15 or more minutes).  Arriving late affects you and other patients whose appointments are after yours.  Also, if you miss three or more appointments without notifying the office, you may be dismissed from the clinic at the provider's discretion.      For prescription refill requests, have your pharmacy contact our office and allow 72 hours for refills to be completed.    Today you received the following chemotherapy and/or immunotherapy agents: Etoposide      To help prevent nausea and vomiting after your treatment, we encourage you to take your nausea medication as directed.  BELOW ARE SYMPTOMS THAT SHOULD BE REPORTED IMMEDIATELY: *FEVER GREATER THAN 100.4 F (38 C) OR HIGHER *CHILLS OR SWEATING *NAUSEA AND VOMITING THAT IS NOT CONTROLLED WITH YOUR NAUSEA MEDICATION *UNUSUAL SHORTNESS OF BREATH *UNUSUAL BRUISING OR BLEEDING *URINARY PROBLEMS (pain or burning when urinating, or frequent urination) *BOWEL PROBLEMS (unusual diarrhea, constipation, pain near the anus) TENDERNESS IN MOUTH AND THROAT WITH OR WITHOUT PRESENCE OF ULCERS (sore throat, sores in mouth, or a toothache) UNUSUAL RASH, SWELLING OR PAIN  UNUSUAL VAGINAL DISCHARGE OR ITCHING   Items with * indicate a potential emergency and should be followed up as soon as possible or go to the Emergency Department if any problems should occur.  Please show the CHEMOTHERAPY ALERT CARD or IMMUNOTHERAPY ALERT CARD at check-in to  the Emergency Department and triage nurse.  Should you have questions after your visit or need to cancel or reschedule your appointment, please contact [facility]  Dept: [phone]  and follow the prompts.  Office hours are 8:00 a.m. to 4:30 p.m. Monday - Friday. Please note that voicemails left after 4:00 p.m. may not be returned until the following business day.  We are closed weekends and major holidays. You have access to a nurse at all times for urgent questions. Please call the main number to the clinic Dept: [phone] and follow the prompts.   For any non-urgent questions, you may also contact your provider using MyChart. We now offer e-Visits for anyone 67 and older to request care online for non-urgent symptoms. For details visit [email or web address].   Also download the MyChart app! Go to the app store, search "MyChart", open the app, select [facility], and log in with your MyChart username and password.  Due to Covid, a mask is required upon entering the hospital/clinic. If you do not have a mask, one will be given to you upon arrival. For doctor visits, patients may have 1 support person aged 4 or older with them. For treatment visits, patients cannot have anyone with them due to current Covid guidelines and our immunocompromised population.

## 2022-01-19 ENCOUNTER — Inpatient Hospital Stay: Payer: PPO

## 2022-01-19 ENCOUNTER — Other Ambulatory Visit: Payer: Self-pay

## 2022-01-19 DIAGNOSIS — C3412 Malignant neoplasm of upper lobe, left bronchus or lung: Secondary | ICD-10-CM

## 2022-01-19 DIAGNOSIS — Z5111 Encounter for antineoplastic chemotherapy: Secondary | ICD-10-CM | POA: Diagnosis not present

## 2022-01-19 LAB — CBC WITH DIFFERENTIAL (CANCER CENTER ONLY)
Abs Immature Granulocytes: 0.17 10*3/uL — ABNORMAL HIGH (ref 0.00–0.07)
Basophils Absolute: 0 10*3/uL (ref 0.0–0.1)
Basophils Relative: 0 %
Eosinophils Absolute: 0 10*3/uL (ref 0.0–0.5)
Eosinophils Relative: 0 %
HCT: 28.1 % — ABNORMAL LOW (ref 39.0–52.0)
Hemoglobin: 9.9 g/dL — ABNORMAL LOW (ref 13.0–17.0)
Immature Granulocytes: 4 %
Lymphocytes Relative: 5 %
Lymphs Abs: 0.2 10*3/uL — ABNORMAL LOW (ref 0.7–4.0)
MCH: 33.4 pg (ref 26.0–34.0)
MCHC: 35.2 g/dL (ref 30.0–36.0)
MCV: 94.9 fL (ref 80.0–100.0)
Monocytes Absolute: 0 10*3/uL — ABNORMAL LOW (ref 0.1–1.0)
Monocytes Relative: 1 %
Neutro Abs: 4.2 10*3/uL (ref 1.7–7.7)
Neutrophils Relative %: 90 %
Platelet Count: 147 10*3/uL — ABNORMAL LOW (ref 150–400)
RBC: 2.96 MIL/uL — ABNORMAL LOW (ref 4.22–5.81)
RDW: 15.8 % — ABNORMAL HIGH (ref 11.5–15.5)
WBC Count: 4.7 10*3/uL (ref 4.0–10.5)
nRBC: 0 % (ref 0.0–0.2)

## 2022-01-19 LAB — CMP (CANCER CENTER ONLY)
ALT: 23 U/L (ref 0–44)
AST: 17 U/L (ref 15–41)
Albumin: 3.7 g/dL (ref 3.5–5.0)
Alkaline Phosphatase: 58 U/L (ref 38–126)
Anion gap: 4 — ABNORMAL LOW (ref 5–15)
BUN: 19 mg/dL (ref 8–23)
CO2: 30 mmol/L (ref 22–32)
Calcium: 10.1 mg/dL (ref 8.9–10.3)
Chloride: 96 mmol/L — ABNORMAL LOW (ref 98–111)
Creatinine: 0.8 mg/dL (ref 0.61–1.24)
GFR, Estimated: >60 mL/min (ref >60)
Glucose, Bld: 161 mg/dL — ABNORMAL HIGH (ref 70–99)
Potassium: 4 mmol/L (ref 3.5–5.1)
Sodium: 130 mmol/L — ABNORMAL LOW (ref 135–145)
Total Bilirubin: 0.8 mg/dL (ref 0.3–1.2)
Total Protein: 6 g/dL — ABNORMAL LOW (ref 6.5–8.1)

## 2022-01-19 LAB — MAGNESIUM: Magnesium: 1.7 mg/dL (ref 1.7–2.4)

## 2022-01-26 ENCOUNTER — Inpatient Hospital Stay (HOSPITAL_BASED_OUTPATIENT_CLINIC_OR_DEPARTMENT_OTHER): Payer: PPO

## 2022-01-26 ENCOUNTER — Other Ambulatory Visit: Payer: Self-pay | Admitting: Physician Assistant

## 2022-01-26 ENCOUNTER — Other Ambulatory Visit: Payer: Self-pay

## 2022-01-26 DIAGNOSIS — Z5111 Encounter for antineoplastic chemotherapy: Secondary | ICD-10-CM | POA: Diagnosis not present

## 2022-01-26 DIAGNOSIS — C3412 Malignant neoplasm of upper lobe, left bronchus or lung: Secondary | ICD-10-CM

## 2022-01-26 LAB — CBC WITH DIFFERENTIAL (CANCER CENTER ONLY)
Abs Immature Granulocytes: 0.01 10*3/uL (ref 0.00–0.07)
Basophils Absolute: 0 10*3/uL (ref 0.0–0.1)
Basophils Relative: 1 %
Eosinophils Absolute: 0 10*3/uL (ref 0.0–0.5)
Eosinophils Relative: 1 %
HCT: 25 % — ABNORMAL LOW (ref 39.0–52.0)
Hemoglobin: 8.7 g/dL — ABNORMAL LOW (ref 13.0–17.0)
Immature Granulocytes: 1 %
Lymphocytes Relative: 20 %
Lymphs Abs: 0.3 10*3/uL — ABNORMAL LOW (ref 0.7–4.0)
MCH: 33.6 pg (ref 26.0–34.0)
MCHC: 34.8 g/dL (ref 30.0–36.0)
MCV: 96.5 fL (ref 80.0–100.0)
Monocytes Absolute: 0.1 10*3/uL (ref 0.1–1.0)
Monocytes Relative: 8 %
Neutro Abs: 0.9 10*3/uL — ABNORMAL LOW (ref 1.7–7.7)
Neutrophils Relative %: 69 %
Platelet Count: 65 10*3/uL — ABNORMAL LOW (ref 150–400)
RBC: 2.59 MIL/uL — ABNORMAL LOW (ref 4.22–5.81)
RDW: 16.1 % — ABNORMAL HIGH (ref 11.5–15.5)
WBC Count: 1.3 10*3/uL — ABNORMAL LOW (ref 4.0–10.5)
nRBC: 0 % (ref 0.0–0.2)

## 2022-01-26 LAB — CMP (CANCER CENTER ONLY)
ALT: 17 U/L (ref 0–44)
AST: 13 U/L — ABNORMAL LOW (ref 15–41)
Albumin: 3.7 g/dL (ref 3.5–5.0)
Alkaline Phosphatase: 52 U/L (ref 38–126)
Anion gap: 5 (ref 5–15)
BUN: 11 mg/dL (ref 8–23)
CO2: 29 mmol/L (ref 22–32)
Calcium: 8.9 mg/dL (ref 8.9–10.3)
Chloride: 98 mmol/L (ref 98–111)
Creatinine: 1 mg/dL (ref 0.61–1.24)
GFR, Estimated: >60 mL/min (ref >60)
Glucose, Bld: 140 mg/dL — ABNORMAL HIGH (ref 70–99)
Potassium: 4 mmol/L (ref 3.5–5.1)
Sodium: 132 mmol/L — ABNORMAL LOW (ref 135–145)
Total Bilirubin: 0.4 mg/dL (ref 0.3–1.2)
Total Protein: 5.8 g/dL — ABNORMAL LOW (ref 6.5–8.1)

## 2022-01-26 LAB — MAGNESIUM: Magnesium: 1.6 mg/dL — ABNORMAL LOW (ref 1.7–2.4)

## 2022-01-26 MED ORDER — MAGNESIUM OXIDE -MG SUPPLEMENT 400 (240 MG) MG PO TABS: 400.0000 mg | ORAL_TABLET | Freq: Every day | ORAL | 0 refills | Status: DC

## 2022-01-26 NOTE — [facility] Notes (Signed)
I called the patient to review his labs today.  The patient's total white blood count is 1.3 and his ANC 0.9.  Reviewed neutropenic precautions with the patient.  Encouraged him if he develops any signs or symptoms of infection to call the clinic for further evaluation.  His platelet count is 65,000.  Reviewed bleeding precautions.  His hemoglobin is 8.7.  We will continue to monitor.  No need for blood transfusion at this time.  I am scheduled to see the patient next week.  We will recheck his blood work at that time.  The patient mentions that he has a dental cleaning next week.  I did encourage him to push this appointment out by at least another 2 weeks or so until he has further improvement in his lab work.  His magnesium is slightly low at 1.6.  I have sent magnesium supplements to his pharmacy.  We will recheck his magnesium next week and I will let him know if he can discontinue this or if this should be continued.  He expressed understanding with the instructions and verbalized understanding.

## 2022-01-29 ENCOUNTER — Ambulatory Visit (HOSPITAL_COMMUNITY)
Admission: RE | Admit: 2022-01-29 | Discharge: 2022-01-29 | Disposition: A | Discharge status: Home / Self Care | Payer: PPO | Source: Ambulatory Visit | Attending: Physician Assistant | Admitting: Physician Assistant

## 2022-01-29 DIAGNOSIS — C3412 Malignant neoplasm of upper lobe, left bronchus or lung: Secondary | ICD-10-CM | POA: Insufficient documentation

## 2022-01-29 DIAGNOSIS — C3490 Malignant neoplasm of unspecified part of unspecified bronchus or lung: Secondary | ICD-10-CM | POA: Diagnosis not present

## 2022-01-29 DIAGNOSIS — J432 Centrilobular emphysema: Secondary | ICD-10-CM | POA: Diagnosis not present

## 2022-01-29 MED ORDER — IOHEXOL 300 MG/ML  SOLN
75.0000 mL | Freq: Once | INTRAMUSCULAR | Status: AC | PRN
  Administered 2022-01-29: 75 mL via INTRAVENOUS

## 2022-01-29 MED ORDER — SODIUM CHLORIDE (PF) 0.9 % IJ SOLN
INTRAMUSCULAR | Status: AC
  Filled 2022-01-29: qty 50

## 2022-01-29 NOTE — Progress Notes (Signed)
[facility] OFFICE PROGRESS NOTE  [name], MD [address] [place] [address]  DIAGNOSIS: Limited stage (T1c, N2, M0) small cell lung cancer presented with left upper lobe lung mass in addition to left hilar and mediastinal lymphadenopathy diagnosed in April 2023.  PRIOR THERAPY: Systemic chemotherapy with cisplatin 75 Mg/M2 on day 1 and 2 etoposide 100 Mg/M2 on days 1, 2 and 3 every 3 weeks.  Last dose on 01/14/22. This is concurrent with radiotherapy.  Status post 4 cycles.  CURRENT THERAPY: Observation   INTERVAL HISTORY: Jonathan Castro 66 y.o. male returns to the clinic today for a follow-up visit accompanied by his son.  The patient was diagnosed in April 2023 with limited stage small cell lung cancer.  He underwent radiation and 4 cycles of chemotherapy.  He had some radiation-induced esophagitis from treatment which is improving and he is able to eat more solid foods.  Although he is eating better, he lost a few pounds.  He also completed chemotherapy 3 weeks ago.  He tolerated this well except he did have some fatigue and weakness a few days following treatment and had some cytopenias on lab work.  Overall, he recovered well from his last cycle of treatment.  Today he denies any fever, chills, or night sweats. He denies any signs and symptoms of infection including ear infections, sore throat, abdominal pain, or dysuria. However, he mentions he started to get a rash on his RIGHT chest after starting radiation. He reports after this, he was bite by a tick in the same spot. He has a mild pruritic rash in this area. He has been using his cream from radiation which has been helping. Denies any chest pain, shortness of breath, cough, or hemoptysis.  Denies any nausea, vomiting, diarrhea, or constipation. Denies any headache or visual changes.  He denies any peripheral neuropathy.  His magnesium was slightly low last week and he was started on supplements. The patient is  wondering when it is okay to restart his methotrexate, which has been on hold since starting chemotherapy. Additionally, he is supposed to see ENT for hoarseness. He was previously evaluated and felt that he has incomplete mobility of one of his vocal cords and that this can be corrected surgically. He is wondering if he can have surgery ~October 2023 for this concern.  The patient recently had a restaging CT scan of the chest.  He is here today for evaluation to review his scan results and for more detailed discussion about his current condition and next steps in his care.     MEDICAL HISTORY: Past Medical History:  Diagnosis Date   Arthritis    Cancer ([place])    COPD (chronic obstructive pulmonary disease) ([place])    History of radiation therapy    Left Lung- 11/25/21-01/06/22- Dr. Gery Pray    ALLERGIES:  is allergic to penicillamine, penicillins, and leflunomide.  MEDICATIONS:  Current Outpatient Medications  Medication Sig Dispense Refill   acetaminophen (TYLENOL) 650 MG CR tablet Take 1,300 mg by mouth daily.     albuterol (VENTOLIN HFA) 108 (90 Base) MCG/ACT inhaler Inhale 2 puffs into the lungs every 6 (six) hours as needed for wheezing or shortness of breath.     chlorproMAZINE (THORAZINE) 25 MG tablet TAKE ONE TABLET (25MG  TOTAL) BY MOUTH THREE TIMES DAILY 30 tablet 1   doxycycline (VIBRA-TABS) 100 MG tablet Take 1 tablet (100 mg total) by mouth 2 (two) times daily. 20 tablet 0  fexofenadine-pseudoephedrine (ALLEGRA-D) 60-120 MG 12 hr tablet Take 1 tablet by mouth 2 (two) times daily as needed (allergies).     folic acid (FOLVITE) 1 MG tablet Take 1 mg by mouth daily.     lidocaine (XYLOCAINE) 2 % solution Use as directed 15 mLs in the mouth or throat as needed for mouth pain. Dllute per instructions given by nursing, swallow 20 min prior to meals and bedtime 100 mL 2   magnesium oxide (MAG-OX) 400 (240 Mg) MG tablet Take 1 tablet (400 mg total) by mouth daily. 20 tablet 0    oxyCODONE-acetaminophen (PERCOCET/ROXICET) 5-325 MG tablet Take 1 tablet by mouth every 8 (eight) hours as needed for severe pain. 30 tablet 0   predniSONE (DELTASONE) 5 MG tablet Take 5 mg by mouth daily as needed (arthritis flare).     prochlorperazine (COMPAZINE) 10 MG tablet Take 1 tablet (10 mg total) by mouth every 6 (six) hours as needed for nausea or vomiting. 30 tablet 0   methotrexate 50 MG/2ML injection Inject 15 mg into the vein every Wednesday. (Patient not taking: Reported on 11/17/2021)     No current facility-administered medications for this visit.    SURGICAL HISTORY:  Past Surgical History:  Procedure Laterality Date   MEDIASTINOSCOPY N/A 10/20/2021   Procedure: MEDIASTINOSCOPY;  Surgeon: Melrose Nakayama, MD;  Location: [facility];  Service: Thoracic;  Laterality: N/A;   ROTATOR CUFF REPAIR Left    VIDEO BRONCHOSCOPY WITH ENDOBRONCHIAL ULTRASOUND N/A 10/20/2021   Procedure: VIDEO BRONCHOSCOPY WITH ENDOBRONCHIAL ULTRASOUND WITH BIOPSIES;  Surgeon: Melrose Nakayama, MD;  Location: [facility];  Service: Thoracic;  Laterality: N/A;    REVIEW OF SYSTEMS:   Review of Systems  Constitutional: Negative for appetite change, chills, fatigue, fever and unexpected weight change.  HENT: Positive for hoarseness. Positive for improving radiation esophagitis. Negative for mouth sores, nosebleeds,  and trouble swallowing.   Eyes: Negative for eye problems and icterus.  Respiratory: Negative for cough, hemoptysis, shortness of breath and wheezing.   Cardiovascular: Negative for chest pain and leg swelling.  Gastrointestinal: Negative for abdominal pain, constipation, diarrhea, nausea and vomiting.  Genitourinary: Negative for bladder incontinence, difficulty urinating, dysuria, frequency and hematuria.   Musculoskeletal: Negative for back pain, gait problem, neck pain and neck stiffness.  Skin: Positive for itching and rash on right anterior chest.  Neurological: Negative for dizziness,  extremity weakness, gait problem, headaches, light-headedness and seizures.  Hematological: Negative for adenopathy. Does not bruise/bleed easily.  Psychiatric/Behavioral: Negative for confusion, depression and sleep disturbance. The patient is not nervous/anxious.     PHYSICAL EXAMINATION:  Blood pressure 120/69, pulse 74, resp. rate 18, height 5\' 7"  (1.702 m), weight 152 lb 12.8 oz (69.3 kg), SpO2 100 %.  ECOG PERFORMANCE STATUS: 1  Constitutional: Oriented to person, place, and time and well-developed, well-nourished, and in no distress.  HENT:  Head: Normocephalic and atraumatic.  Mouth/Throat: Positive for hoarseness.  Oropharynx is clear and moist. No oropharyngeal exudate.  Eyes: Conjunctivae are normal. Right eye exhibits no discharge. Left eye exhibits no discharge. No scleral icterus.  Neck: Normal range of motion. Neck supple.  Cardiovascular: Normal rate, regular rhythm, normal heart sounds and intact distal pulses.   Pulmonary/Chest: Effort normal.  Quiet breath sounds in all lung fields.  No respiratory distress. No wheezes. No rales.  Abdominal: Soft. Bowel sounds are normal. Exhibits no distension and no mass. There is no tenderness.  Musculoskeletal: Normal range of motion. Exhibits no edema.  Lymphadenopathy:  No cervical adenopathy.  Neurological: Alert and oriented to person, place, and time. Exhibits normal muscle tone. Gait normal. Coordination normal.  Skin: Mild rash on right anterior chest. Skin is warm and dry. Not diaphoretic. No erythema. No pallor.  Psychiatric: Mood, memory and judgment normal.  Vitals reviewed.  LABORATORY DATA: Lab Results  Component Value Date   WBC 4.2 02/04/2022   HGB 10.5 (L) 02/04/2022   HCT 29.6 (L) 02/04/2022   MCV 97.4 02/04/2022   PLT 230 02/04/2022      Chemistry      Component Value Date/Time   NA 132 (L) 02/04/2022 1400   K 4.0 02/04/2022 1400   CL 100 02/04/2022 1400   CO2 23 02/04/2022 1400   BUN 14  02/04/2022 1400   CREATININE 0.95 02/04/2022 1400      Component Value Date/Time   CALCIUM 9.0 02/04/2022 1400   ALKPHOS 58 02/04/2022 1400   AST 18 02/04/2022 1400   ALT 18 02/04/2022 1400   BILITOT 0.3 02/04/2022 1400       RADIOGRAPHIC STUDIES:     ASSESSMENT/PLAN:  This is a very pleasant 66 year old Caucasian male diagnosed with limited stage (T1c, N2, M0) small cell lung cancer.  The patient presented with a left upper lobe lung mass in addition to a left hilar and mediastinal lymphadenopathy.  He was diagnosed in April 2023.  The patient recently completed a course of systemic chemotherapy with cisplatin 75 mg per metered squared on day 1 and etoposide 100 mg per metered squared on days 1, 2, and 3 IV every 3 weeks.  He is status post 4 cycles.  Patient completed concurrent radiation under the care of Dr. Sondra Come.  He is scheduled for 1 month follow-up visit with Dr. Sondra Come on 02/09/2022.  The patient recently had a restaging CT scan performed.  Dr. Julien Nordmann personally and independently reviewed the scan and discussed the results with the patient today.  The scan showed interval resolution of the previous left upper lobe perihilar mass and ipsilateral mediastinal and hilar nodal metastasis. No new sites of disease.    Dr. Julien Nordmann recommends that the patient continue on observation.  With a repeat CT scan of the chest in 3 months.  In the meantime, the patient is scheduled for a follow-up visit with Dr. Sondra Come on 02/09/2022.  Encouraged them to discuss PCI at this appointment. From our standpoint, he is ok to resume his methotrexate; however, I advised him to wait until he sees Dr. Sondra Come to make sure he is cleared from a radiation standpoint.   From our standpoint, the patient is cleared for surgery, if needed, for his vocal cord paralysis. If they need any clearance forms completed, I gave him our fax number.   Regarding the rash, this could be from radiation, but the patient  states that it worsened after a tick bite. We will send a prescription for doxycycline 100 mg BID for 10 days to treat empirically for tick borne illness. Cautioned to avoid sun-exposure while on doxycyline as it can cause skin rash.    The patient was advised to call immediately if he has any concerning symptoms in the interval. The patient voices understanding of current disease status and treatment options and is in agreement with the current care plan. All questions were answered. The patient knows to call the clinic with any problems, questions or concerns. We can certainly see the patient much sooner if necessary         Orders  Placed This Encounter  Procedures   CT Chest W Contrast    Standing Status:   Future    Standing Expiration Date:   02/04/2023    Order Specific Question:   If indicated for the ordered procedure, I authorize the administration of contrast media per Radiology protocol    Answer:   Yes    Order Specific Question:   Preferred imaging location?    Answer:   [facility]   CBC with Differential ([facility] Only)    Standing Status:   Future    Standing Expiration Date:   02/05/2023   CMP ([facility] only)    Standing Status:   Future    Standing Expiration Date:   02/05/2023      Tobe Sos Tammra Pressman, PA-C 02/04/22  ADDENDUM: Hematology/Oncology Attending: I had a face-to-face encounter with the patient today.  I reviewed his record, lab, scan and recommended his care plan.  This is a very pleasant 67 years old white male with limited stage small cell lung cancer diagnosed in April 2023 status post systemic chemotherapy with 4 cycles of cisplatin and etoposide concurrent with radiation. The patient tolerated this course of treatment fairly well except for fatigue. He had repeat CT scan of the chest performed recently.  I personally and independently reviewed the scans and discussed the result with the patient and his son. His scan showed  significant improvement of his disease with almost complete resolution of the left upper lobe perihilar mass and ipsilateral mediastinal and hilar nodal metastasis. I recommended for the patient to continue on observation for now with repeat CT scan of the chest in 3 months. In the meantime the patient will see Dr. Sondra Come for evaluation and discussion of prophylactic cranial irradiation. He was advised to call immediately if he has any other concerning symptoms in the interval. The total time spent in the appointment was 30 minutes. Disclaimer: This note was dictated with voice recognition software. Similar sounding words can inadvertently be transcribed and may be missed upon review. Eilleen Kempf, MD

## 2022-02-03 ENCOUNTER — Encounter: Payer: Self-pay | Admitting: Internal Medicine

## 2022-02-04 ENCOUNTER — Encounter: Payer: Self-pay | Admitting: Physician Assistant

## 2022-02-04 ENCOUNTER — Inpatient Hospital Stay: Payer: PPO

## 2022-02-04 ENCOUNTER — Inpatient Hospital Stay: Payer: PPO | Attending: Internal Medicine | Admitting: Physician Assistant

## 2022-02-04 ENCOUNTER — Other Ambulatory Visit: Payer: Self-pay

## 2022-02-04 ENCOUNTER — Encounter: Payer: Self-pay | Admitting: Radiation Oncology

## 2022-02-04 VITALS — BP 120/69 | HR 74 | Resp 18 | Ht 67.0 in | Wt 152.8 lb

## 2022-02-04 DIAGNOSIS — C3412 Malignant neoplasm of upper lobe, left bronchus or lung: Secondary | ICD-10-CM | POA: Insufficient documentation

## 2022-02-04 DIAGNOSIS — R21 Rash and other nonspecific skin eruption: Secondary | ICD-10-CM | POA: Diagnosis not present

## 2022-02-04 DIAGNOSIS — J3800 Paralysis of vocal cords and larynx, unspecified: Secondary | ICD-10-CM | POA: Diagnosis not present

## 2022-02-04 DIAGNOSIS — W57XXXA Bitten or stung by nonvenomous insect and other nonvenomous arthropods, initial encounter: Secondary | ICD-10-CM

## 2022-02-04 DIAGNOSIS — Z923 Personal history of irradiation: Secondary | ICD-10-CM | POA: Diagnosis not present

## 2022-02-04 LAB — CMP (CANCER CENTER ONLY)
ALT: 18 U/L (ref 0–44)
AST: 18 U/L (ref 15–41)
Albumin: 3.7 g/dL (ref 3.5–5.0)
Alkaline Phosphatase: 58 U/L (ref 38–126)
Anion gap: 9 (ref 5–15)
BUN: 14 mg/dL (ref 8–23)
CO2: 23 mmol/L (ref 22–32)
Calcium: 9 mg/dL (ref 8.9–10.3)
Chloride: 100 mmol/L (ref 98–111)
Creatinine: 0.95 mg/dL (ref 0.61–1.24)
GFR, Estimated: >60 mL/min (ref >60)
Glucose, Bld: 134 mg/dL — ABNORMAL HIGH (ref 70–99)
Potassium: 4 mmol/L (ref 3.5–5.1)
Sodium: 132 mmol/L — ABNORMAL LOW (ref 135–145)
Total Bilirubin: 0.3 mg/dL (ref 0.3–1.2)
Total Protein: 6.3 g/dL — ABNORMAL LOW (ref 6.5–8.1)

## 2022-02-04 LAB — CBC WITH DIFFERENTIAL (CANCER CENTER ONLY)
Abs Immature Granulocytes: 0.09 10*3/uL — ABNORMAL HIGH (ref 0.00–0.07)
Basophils Absolute: 0 10*3/uL (ref 0.0–0.1)
Basophils Relative: 1 %
Eosinophils Absolute: 0 10*3/uL (ref 0.0–0.5)
Eosinophils Relative: 1 %
HCT: 29.6 % — ABNORMAL LOW (ref 39.0–52.0)
Hemoglobin: 10.5 g/dL — ABNORMAL LOW (ref 13.0–17.0)
Immature Granulocytes: 2 %
Lymphocytes Relative: 19 %
Lymphs Abs: 0.8 10*3/uL (ref 0.7–4.0)
MCH: 34.5 pg — ABNORMAL HIGH (ref 26.0–34.0)
MCHC: 35.5 g/dL (ref 30.0–36.0)
MCV: 97.4 fL (ref 80.0–100.0)
Monocytes Absolute: 0.7 10*3/uL (ref 0.1–1.0)
Monocytes Relative: 17 %
Neutro Abs: 2.5 10*3/uL (ref 1.7–7.7)
Neutrophils Relative %: 60 %
Platelet Count: 230 10*3/uL (ref 150–400)
RBC: 3.04 MIL/uL — ABNORMAL LOW (ref 4.22–5.81)
RDW: 16.9 % — ABNORMAL HIGH (ref 11.5–15.5)
WBC Count: 4.2 10*3/uL (ref 4.0–10.5)
nRBC: 0 % (ref 0.0–0.2)

## 2022-02-04 LAB — MAGNESIUM: Magnesium: 2 mg/dL (ref 1.7–2.4)

## 2022-02-04 MED ORDER — DOXYCYCLINE HYCLATE 100 MG PO TABS: 100.0000 mg | ORAL_TABLET | Freq: Two times a day (BID) | ORAL | 0 refills | Status: DC

## 2022-02-06 ENCOUNTER — Other Ambulatory Visit: Payer: Self-pay

## 2022-02-07 NOTE — Progress Notes (Signed)
Radiation Oncology         [phone] ________________________________  Name: Jonathan Castro MRN: [id number]  Date: 02/09/2022  DOB: Apr 21, 1956  Follow-Up Visit Note  CC: [name], MD  Curt Bears, MD  No diagnosis found.  Diagnosis: The encounter diagnosis was Primary small cell carcinoma of upper lobe of left lung ([place]).   Large left upper lobe pulmonary nodule: small cell carcinoma with lymph node metastases   Cancer Staging  Primary small cell carcinoma of upper lobe of left lung (HCC) Staging form: Lung, AJCC 8th Edition - Clinical: Stage IIIA (cT1c, cN2, cM0) - Signed by Curt Bears, MD on 10/30/2021   Interval Since Last Radiation: 1 month and 3 days  Intent: Curative  Radiation Treatment Dates: 11/25/2021 through 01/06/2022 Site Technique Total Dose (Gy) Dose per Fx (Gy) Completed Fx Beam Energies  Lung, Left: Lung_L 3D 60/60 2 30/30 6X, 10X    Narrative:  The patient returns today for routine follow-up.  The patient tolerated radiation therapy relatively well. On the date of his final treatment, the patient reported pain, fatigue, skin irritation, mild SOB with exertion, and esophageal problems (managed with medication). He denied any other symptoms or concerns.             In the interval, the patient completed his 4th and final cycle of chemotherapy consisting of cisplatin and etoposide on 01/14/22.  The patient tolerated treatment fairly well except for fatigue.   During his most recent follow up visit with Dr. Julien Nordmann on 02/04/22, the patient reported onset of a rash on his right chest which worsened after a tick bite. Although the rash began after radiation, and thus could potentially be treatment related, he was given a prescription for doxycycline to treat this empirically. Treatment for his vocal cord paralysis was also discussed, for which the patient was cleared to pursue surgical intervention for (he has yet to meet with ENT to discuss this). Lastly,  Dr. Julien Nordmann recommended the patient to proceed with prophylactic cranial irradiation which we will discuss today.   Pertinent imaging performed in the interval includes a chest CT on 01/29/22 which showed interval resolution of the previous left upper lobe perihilar mass, and ipsilateral mediastinal and hilar nodal metastasis. No new site of disease were appreciated.   ***  Allergies:  is allergic to penicillamine, penicillins, and leflunomide.  Meds: Current Outpatient Medications  Medication Sig Dispense Refill   acetaminophen (TYLENOL) 650 MG CR tablet Take 1,300 mg by mouth daily.     albuterol (VENTOLIN HFA) 108 (90 Base) MCG/ACT inhaler Inhale 2 puffs into the lungs every 6 (six) hours as needed for wheezing or shortness of breath.     chlorproMAZINE (THORAZINE) 25 MG tablet TAKE ONE TABLET (25MG  TOTAL) BY MOUTH THREE TIMES DAILY 30 tablet 1   doxycycline (VIBRA-TABS) 100 MG tablet Take 1 tablet (100 mg total) by mouth 2 (two) times daily. 20 tablet 0   fexofenadine-pseudoephedrine (ALLEGRA-D) 60-120 MG 12 hr tablet Take 1 tablet by mouth 2 (two) times daily as needed (allergies).     folic acid (FOLVITE) 1 MG tablet Take 1 mg by mouth daily.     lidocaine (XYLOCAINE) 2 % solution Use as directed 15 mLs in the mouth or throat as needed for mouth pain. Dllute per instructions given by nursing, swallow 20 min prior to meals and bedtime 100 mL 2   magnesium oxide (MAG-OX) 400 (240 Mg) MG tablet Take 1 tablet (400 mg total) by mouth daily.  20 tablet 0   methotrexate 50 MG/2ML injection Inject 15 mg into the vein every Wednesday. (Patient not taking: Reported on 11/17/2021)     oxyCODONE-acetaminophen (PERCOCET/ROXICET) 5-325 MG tablet Take 1 tablet by mouth every 8 (eight) hours as needed for severe pain. 30 tablet 0   predniSONE (DELTASONE) 5 MG tablet Take 5 mg by mouth daily as needed (arthritis flare).     prochlorperazine (COMPAZINE) 10 MG tablet Take 1 tablet (10 mg total) by mouth every  6 (six) hours as needed for nausea or vomiting. 30 tablet 0   No current facility-administered medications for this encounter.    Physical Findings: The patient is in no acute distress. Patient is alert and oriented.  vitals were not taken for this visit. .  No significant changes. Lungs are clear to auscultation bilaterally. Heart has regular rate and rhythm. No palpable cervical, supraclavicular, or axillary adenopathy. Abdomen soft, non-tender, normal bowel sounds.   Lab Findings: Lab Results  Component Value Date   WBC 4.2 02/04/2022   HGB 10.5 (L) 02/04/2022   HCT 29.6 (L) 02/04/2022   MCV 97.4 02/04/2022   PLT 230 02/04/2022    Radiographic Findings: CT Chest W Contrast  Result Date: 01/30/2022 CLINICAL DATA:  Small cell lung cancer.  Restaging. EXAM: CT CHEST WITH CONTRAST TECHNIQUE: Multidetector CT imaging of the chest was performed during intravenous contrast administration. RADIATION DOSE REDUCTION: This exam was performed according to the departmental dose-optimization program which includes automated exposure control, adjustment of the mA and/or kV according to patient size and/or use of iterative reconstruction technique. ***body onc*** CONTRAST:  43mL OMNIPAQUE IOHEXOL 300 MG/ML  SOLN COMPARISON:  PET-CT from 10/09/2021. FINDINGS: Cardiovascular: Normal heart size. No pericardial effusion. Mild aortic atherosclerosis. Coronary artery calcifications. Mediastinum/Nodes: Thyroid gland, trachea and esophagus are unremarkable. No enlarged axillary lymph nodes. Interval resolution of previous left mediastinal and left hilar adenopathy. No new or enlarging lymph nodes identified. Lungs/Pleura: Severe changes of paraseptal and centrilobular emphysema with an upper lung zone predominance. The previous left upper lobe perihilar mass has resolved in the interval. Calcified granuloma noted within the lingula. No new findings Upper Abdomen: No acute abnormality. Unchanged simple appearing  left kidney cyst. No follow-up imaging recommended. Musculoskeletal: No chest wall abnormality. No acute or significant osseous findings. IMPRESSION: 1. Interval resolution of previous left upper lobe perihilar mass and ipsilateral mediastinal and hilar nodal metastasis. No new sites of disease. 2. Coronary artery calcifications. 3. Aortic Atherosclerosis (ICD10-I70.0) and Emphysema (ICD10-J43.9). Electronically Signed   By: Kerby Moors M.D.   On: 01/30/2022 06:12    Impression: The encounter diagnosis was Primary small cell carcinoma of upper lobe of left lung ([place]).   Large left upper lobe pulmonary nodule: small cell carcinoma with lymph node metastases    The patient is recovering from the effects of radiation.  ***  Plan:  ***   *** minutes of total time was spent for this patient encounter, including preparation, face-to-face counseling with the patient and coordination of care, physical exam, and documentation of the encounter. ____________________________________  Blair Promise, PhD, MD  This document serves as a record of services personally performed by Gery Pray, MD. It was created on his behalf by Roney Mans, a trained medical scribe. The creation of this record is based on the scribe's personal observations and the provider's statements to them. This document has been checked and approved by the attending provider.

## 2022-02-07 NOTE — Progress Notes (Incomplete)
  Radiation Oncology         [phone] ________________________________  Patient Name: Jonathan Castro MRN: [id number] DOB: Oct 08, 1955 Referring Physician: Curt Bears (Profile Not Attached) Date of Service: 01/06/2022 [facility] [place]                                                        End Of Treatment Note  Diagnoses: C34.12-Malignant neoplasm of upper lobe, left bronchus or lung  Cancer Staging: The encounter diagnosis was Primary small cell carcinoma of upper lobe of left lung ([name]).   Large left upper lobe pulmonary nodule: small cell carcinoma with lymph node metastases   Cancer Staging  Primary small cell carcinoma of upper lobe of left lung (HCC) Staging form: Lung, AJCC 8th Edition - Clinical: Stage IIIA (cT1c, cN2, cM0) - Signed by Curt Bears, MD on 10/30/2021  Intent: Curative  Radiation Treatment Dates: 11/25/2021 through 01/06/2022 Site Technique Total Dose (Gy) Dose per Fx (Gy) Completed Fx Beam Energies  Lung, Left: Lung_L 3D 60/60 2 30/30 6X, 10X   Narrative: The patient tolerated radiation therapy relatively well. On the date of his final treatment, the patient reported pain, fatigue, skin irritation, mild SOB with exertion, and esophageal problems (managed with medication). He denied any other symptoms or concerns.   Plan: The patient will follow-up with radiation oncology in one month .  ________________________________________________ -----------------------------------  Blair Promise, PhD, MD  This document serves as a record of services personally performed by Gery Pray, MD. It was created on his behalf by Roney Mans, a trained medical scribe. The creation of this record is based on the scribe's personal observations and the provider's statements to them. This document has been checked and approved by the attending provider.

## 2022-02-09 ENCOUNTER — Other Ambulatory Visit: Payer: Self-pay

## 2022-02-09 ENCOUNTER — Ambulatory Visit
Admission: RE | Admit: 2022-02-09 | Discharge: 2022-02-09 | Disposition: A | Discharge status: Home / Self Care | Payer: PPO | Source: Ambulatory Visit | Attending: Radiation Oncology | Admitting: Radiation Oncology

## 2022-02-09 ENCOUNTER — Encounter: Payer: Self-pay | Admitting: Radiation Oncology

## 2022-02-09 VITALS — BP 102/72 | HR 73 | Temp 97.9°F | Resp 18 | Ht 67.0 in | Wt 153.8 lb

## 2022-02-09 DIAGNOSIS — C3412 Malignant neoplasm of upper lobe, left bronchus or lung: Secondary | ICD-10-CM | POA: Diagnosis not present

## 2022-02-09 DIAGNOSIS — Z510 Encounter for antineoplastic radiation therapy: Secondary | ICD-10-CM | POA: Diagnosis not present

## 2022-02-09 DIAGNOSIS — Z298 Encounter for other specified prophylactic measures: Secondary | ICD-10-CM | POA: Diagnosis not present

## 2022-02-09 DIAGNOSIS — C771 Secondary and unspecified malignant neoplasm of intrathoracic lymph nodes: Secondary | ICD-10-CM | POA: Insufficient documentation

## 2022-02-09 HISTORY — DX: Personal history of irradiation: Z92.3

## 2022-02-09 NOTE — Progress Notes (Signed)
Jonathan Castro is here today for follow up post radiation to the lung.  Lung Side: Left, patient completed treatment on 01/06/22.  Does the patient complain of any of the following: Pain:Patient reports intermittent pain to right chest at times.  Shortness of breath w/wo exertion: Yes, mostly on exertion.  Cough: Productive.  Hemoptysis: No Pain with swallowing: No Swallowing/choking concerns: No Appetite: Good  Energy Level: Improving  Post radiation skin Changes: Patient has small area to right chest, patient currently on doxycycline, patient also applying sonafine.     Additional comments if applicable:   BP 169/45 (BP Location: Right Arm, Patient Position: Sitting, Cuff Size: Normal)   Pulse 73   Temp 97.9 F (36.6 C)   Resp 18   Ht 5\' 7"  (1.702 m)   Wt 153 lb 12.8 oz (69.8 kg)   SpO2 100%   BMI 24.09 kg/m

## 2022-02-11 DIAGNOSIS — M62830 Muscle spasm of back: Secondary | ICD-10-CM | POA: Diagnosis not present

## 2022-02-11 DIAGNOSIS — M9905 Segmental and somatic dysfunction of pelvic region: Secondary | ICD-10-CM | POA: Diagnosis not present

## 2022-02-11 DIAGNOSIS — M546 Pain in thoracic spine: Secondary | ICD-10-CM | POA: Diagnosis not present

## 2022-02-11 DIAGNOSIS — Z298 Encounter for other specified prophylactic measures: Secondary | ICD-10-CM | POA: Diagnosis not present

## 2022-02-11 DIAGNOSIS — M9902 Segmental and somatic dysfunction of thoracic region: Secondary | ICD-10-CM | POA: Diagnosis not present

## 2022-02-11 DIAGNOSIS — Z87891 Personal history of nicotine dependence: Secondary | ICD-10-CM | POA: Diagnosis not present

## 2022-02-11 DIAGNOSIS — M9903 Segmental and somatic dysfunction of lumbar region: Secondary | ICD-10-CM | POA: Diagnosis not present

## 2022-02-11 DIAGNOSIS — C3412 Malignant neoplasm of upper lobe, left bronchus or lung: Secondary | ICD-10-CM | POA: Diagnosis not present

## 2022-02-17 ENCOUNTER — Ambulatory Visit (HOSPITAL_COMMUNITY)
Admission: RE | Admit: 2022-02-17 | Discharge: 2022-02-17 | Disposition: A | Discharge status: Home / Self Care | Payer: PPO | Source: Ambulatory Visit | Attending: Radiation Oncology | Admitting: Radiation Oncology

## 2022-02-17 DIAGNOSIS — M25511 Pain in right shoulder: Secondary | ICD-10-CM | POA: Diagnosis not present

## 2022-02-17 DIAGNOSIS — K219 Gastro-esophageal reflux disease without esophagitis: Secondary | ICD-10-CM | POA: Diagnosis not present

## 2022-02-17 DIAGNOSIS — C3412 Malignant neoplasm of upper lobe, left bronchus or lung: Secondary | ICD-10-CM | POA: Diagnosis not present

## 2022-02-17 DIAGNOSIS — M5030 Other cervical disc degeneration, unspecified cervical region: Secondary | ICD-10-CM | POA: Diagnosis not present

## 2022-02-17 DIAGNOSIS — M1991 Primary osteoarthritis, unspecified site: Secondary | ICD-10-CM | POA: Diagnosis not present

## 2022-02-17 DIAGNOSIS — Z6824 Body mass index (BMI) 24.0-24.9, adult: Secondary | ICD-10-CM | POA: Diagnosis not present

## 2022-02-17 DIAGNOSIS — M0579 Rheumatoid arthritis with rheumatoid factor of multiple sites without organ or systems involvement: Secondary | ICD-10-CM | POA: Diagnosis not present

## 2022-02-17 DIAGNOSIS — C3490 Malignant neoplasm of unspecified part of unspecified bronchus or lung: Secondary | ICD-10-CM | POA: Diagnosis not present

## 2022-02-17 MED ORDER — GADOBUTROL 1 MMOL/ML IV SOLN
7.0000 mL | Freq: Once | INTRAVENOUS | Status: AC | PRN
  Administered 2022-02-17: 7 mL via INTRAVENOUS

## 2022-02-19 ENCOUNTER — Other Ambulatory Visit: Payer: Self-pay

## 2022-02-19 ENCOUNTER — Ambulatory Visit
Admission: RE | Admit: 2022-02-19 | Discharge: 2022-02-19 | Disposition: A | Discharge status: Home / Self Care | Payer: PPO | Source: Ambulatory Visit | Attending: Radiation Oncology | Admitting: Radiation Oncology

## 2022-02-19 DIAGNOSIS — Z298 Encounter for other specified prophylactic measures: Secondary | ICD-10-CM | POA: Diagnosis not present

## 2022-02-19 DIAGNOSIS — Z510 Encounter for antineoplastic radiation therapy: Secondary | ICD-10-CM | POA: Diagnosis not present

## 2022-02-19 DIAGNOSIS — C3412 Malignant neoplasm of upper lobe, left bronchus or lung: Secondary | ICD-10-CM

## 2022-02-19 DIAGNOSIS — Z87891 Personal history of nicotine dependence: Secondary | ICD-10-CM | POA: Diagnosis not present

## 2022-02-23 DIAGNOSIS — Z298 Encounter for other specified prophylactic measures: Secondary | ICD-10-CM | POA: Diagnosis not present

## 2022-02-23 DIAGNOSIS — Z510 Encounter for antineoplastic radiation therapy: Secondary | ICD-10-CM | POA: Diagnosis not present

## 2022-02-23 DIAGNOSIS — Z87891 Personal history of nicotine dependence: Secondary | ICD-10-CM | POA: Diagnosis not present

## 2022-02-23 DIAGNOSIS — C3412 Malignant neoplasm of upper lobe, left bronchus or lung: Secondary | ICD-10-CM | POA: Diagnosis not present

## 2022-02-26 ENCOUNTER — Ambulatory Visit
Admission: RE | Admit: 2022-02-26 | Discharge: 2022-02-26 | Disposition: A | Discharge status: Home / Self Care | Payer: PPO | Source: Ambulatory Visit | Attending: Radiation Oncology | Admitting: Radiation Oncology

## 2022-02-26 ENCOUNTER — Other Ambulatory Visit: Payer: Self-pay

## 2022-02-26 DIAGNOSIS — C3412 Malignant neoplasm of upper lobe, left bronchus or lung: Secondary | ICD-10-CM

## 2022-02-26 DIAGNOSIS — Z298 Encounter for other specified prophylactic measures: Secondary | ICD-10-CM | POA: Diagnosis not present

## 2022-02-26 DIAGNOSIS — Z510 Encounter for antineoplastic radiation therapy: Secondary | ICD-10-CM | POA: Diagnosis not present

## 2022-02-26 DIAGNOSIS — Z87891 Personal history of nicotine dependence: Secondary | ICD-10-CM | POA: Diagnosis not present

## 2022-02-26 LAB — RAD ONC ARIA SESSION SUMMARY
Course Elapsed Days: 0
Plan Fractions Treated to Date: 1
Plan Prescribed Dose Per Fraction: 2.5 Gy
Plan Total Fractions Prescribed: 10
Plan Total Prescribed Dose: 25 Gy
Reference Point Dosage Given to Date: 2.5 Gy
Reference Point Session Dosage Given: 2.5 Gy
Session Number: 1

## 2022-02-27 ENCOUNTER — Telehealth: Payer: Self-pay

## 2022-02-27 ENCOUNTER — Ambulatory Visit: Payer: PPO

## 2022-02-27 NOTE — Telephone Encounter (Signed)
Received a call from patient stating that he is not feeling well. Patient states he  is having  cold sweats and headache since completing first treatment to his brain yesterday.  Denies fever. Patient states he will not come in today for treatment. Patient will get tested for Covid and resume treatments next week.

## 2022-03-03 ENCOUNTER — Other Ambulatory Visit: Payer: Self-pay

## 2022-03-03 ENCOUNTER — Ambulatory Visit
Admission: RE | Admit: 2022-03-03 | Discharge: 2022-03-03 | Disposition: A | Discharge status: Home / Self Care | Payer: PPO | Source: Ambulatory Visit | Attending: Radiation Oncology | Admitting: Radiation Oncology

## 2022-03-03 DIAGNOSIS — Z510 Encounter for antineoplastic radiation therapy: Secondary | ICD-10-CM | POA: Diagnosis not present

## 2022-03-03 DIAGNOSIS — C771 Secondary and unspecified malignant neoplasm of intrathoracic lymph nodes: Secondary | ICD-10-CM | POA: Insufficient documentation

## 2022-03-03 DIAGNOSIS — H2513 Age-related nuclear cataract, bilateral: Secondary | ICD-10-CM | POA: Diagnosis not present

## 2022-03-03 DIAGNOSIS — H524 Presbyopia: Secondary | ICD-10-CM | POA: Diagnosis not present

## 2022-03-03 DIAGNOSIS — H25013 Cortical age-related cataract, bilateral: Secondary | ICD-10-CM | POA: Diagnosis not present

## 2022-03-03 DIAGNOSIS — C3412 Malignant neoplasm of upper lobe, left bronchus or lung: Secondary | ICD-10-CM | POA: Diagnosis not present

## 2022-03-03 DIAGNOSIS — H5203 Hypermetropia, bilateral: Secondary | ICD-10-CM | POA: Diagnosis not present

## 2022-03-03 DIAGNOSIS — H52203 Unspecified astigmatism, bilateral: Secondary | ICD-10-CM | POA: Diagnosis not present

## 2022-03-03 LAB — RAD ONC ARIA SESSION SUMMARY
Course Elapsed Days: 5
Plan Fractions Treated to Date: 2
Plan Prescribed Dose Per Fraction: 2.5 Gy
Plan Total Fractions Prescribed: 10
Plan Total Prescribed Dose: 25 Gy
Reference Point Dosage Given to Date: 5 Gy
Reference Point Session Dosage Given: 2.5 Gy
Session Number: 2

## 2022-03-03 NOTE — Telephone Encounter (Signed)
Called to check on patients status. Patient states he feels much better and he will be coming in for treatment today. Patient reports having a negative covid test.

## 2022-03-04 ENCOUNTER — Ambulatory Visit
Admission: RE | Admit: 2022-03-04 | Discharge: 2022-03-04 | Disposition: A | Discharge status: Home / Self Care | Payer: PPO | Source: Ambulatory Visit | Attending: Radiation Oncology | Admitting: Radiation Oncology

## 2022-03-04 ENCOUNTER — Other Ambulatory Visit: Payer: Self-pay

## 2022-03-04 DIAGNOSIS — Z510 Encounter for antineoplastic radiation therapy: Secondary | ICD-10-CM | POA: Diagnosis not present

## 2022-03-04 LAB — RAD ONC ARIA SESSION SUMMARY
Course Elapsed Days: 6
Plan Fractions Treated to Date: 3
Plan Prescribed Dose Per Fraction: 2.5 Gy
Plan Total Fractions Prescribed: 10
Plan Total Prescribed Dose: 25 Gy
Reference Point Dosage Given to Date: 7.5 Gy
Reference Point Session Dosage Given: 2.5 Gy
Session Number: 3

## 2022-03-05 ENCOUNTER — Other Ambulatory Visit: Payer: Self-pay

## 2022-03-05 ENCOUNTER — Ambulatory Visit
Admission: RE | Admit: 2022-03-05 | Discharge: 2022-03-05 | Disposition: A | Discharge status: Home / Self Care | Payer: PPO | Source: Ambulatory Visit | Attending: Radiation Oncology | Admitting: Radiation Oncology

## 2022-03-05 DIAGNOSIS — Z510 Encounter for antineoplastic radiation therapy: Secondary | ICD-10-CM | POA: Diagnosis not present

## 2022-03-05 LAB — RAD ONC ARIA SESSION SUMMARY
Course Elapsed Days: 7
Plan Fractions Treated to Date: 4
Plan Prescribed Dose Per Fraction: 2.5 Gy
Plan Total Fractions Prescribed: 10
Plan Total Prescribed Dose: 25 Gy
Reference Point Dosage Given to Date: 10 Gy
Reference Point Session Dosage Given: 2.5 Gy
Session Number: 4

## 2022-03-06 ENCOUNTER — Ambulatory Visit
Admission: RE | Admit: 2022-03-06 | Discharge: 2022-03-06 | Disposition: A | Discharge status: Home / Self Care | Payer: PPO | Source: Ambulatory Visit | Attending: Radiation Oncology | Admitting: Radiation Oncology

## 2022-03-06 ENCOUNTER — Other Ambulatory Visit: Payer: Self-pay

## 2022-03-06 DIAGNOSIS — Z510 Encounter for antineoplastic radiation therapy: Secondary | ICD-10-CM | POA: Diagnosis not present

## 2022-03-06 DIAGNOSIS — Z298 Encounter for other specified prophylactic measures: Secondary | ICD-10-CM | POA: Diagnosis not present

## 2022-03-06 DIAGNOSIS — Z87891 Personal history of nicotine dependence: Secondary | ICD-10-CM | POA: Diagnosis not present

## 2022-03-06 DIAGNOSIS — C3412 Malignant neoplasm of upper lobe, left bronchus or lung: Secondary | ICD-10-CM | POA: Diagnosis not present

## 2022-03-06 LAB — RAD ONC ARIA SESSION SUMMARY
Course Elapsed Days: 8
Plan Fractions Treated to Date: 5
Plan Prescribed Dose Per Fraction: 2.5 Gy
Plan Total Fractions Prescribed: 10
Plan Total Prescribed Dose: 25 Gy
Reference Point Dosage Given to Date: 12.5 Gy
Reference Point Session Dosage Given: 2.5 Gy
Session Number: 5

## 2022-03-09 ENCOUNTER — Ambulatory Visit
Admission: RE | Admit: 2022-03-09 | Discharge: 2022-03-09 | Disposition: A | Discharge status: Home / Self Care | Payer: PPO | Source: Ambulatory Visit | Attending: Radiation Oncology | Admitting: Radiation Oncology

## 2022-03-09 ENCOUNTER — Other Ambulatory Visit: Payer: Self-pay

## 2022-03-09 DIAGNOSIS — Z510 Encounter for antineoplastic radiation therapy: Secondary | ICD-10-CM | POA: Diagnosis not present

## 2022-03-09 LAB — RAD ONC ARIA SESSION SUMMARY
Course Elapsed Days: 11
Plan Fractions Treated to Date: 6
Plan Prescribed Dose Per Fraction: 2.5 Gy
Plan Total Fractions Prescribed: 10
Plan Total Prescribed Dose: 25 Gy
Reference Point Dosage Given to Date: 15 Gy
Reference Point Session Dosage Given: 2.5 Gy
Session Number: 6

## 2022-03-10 ENCOUNTER — Ambulatory Visit
Admission: RE | Admit: 2022-03-10 | Discharge: 2022-03-10 | Disposition: A | Discharge status: Home / Self Care | Payer: PPO | Source: Ambulatory Visit | Attending: Radiation Oncology | Admitting: Radiation Oncology

## 2022-03-10 ENCOUNTER — Other Ambulatory Visit: Payer: Self-pay | Admitting: Radiation Oncology

## 2022-03-10 ENCOUNTER — Other Ambulatory Visit: Payer: Self-pay

## 2022-03-10 DIAGNOSIS — Z510 Encounter for antineoplastic radiation therapy: Secondary | ICD-10-CM | POA: Diagnosis not present

## 2022-03-10 LAB — RAD ONC ARIA SESSION SUMMARY
Course Elapsed Days: 12
Plan Fractions Treated to Date: 7
Plan Prescribed Dose Per Fraction: 2.5 Gy
Plan Total Fractions Prescribed: 10
Plan Total Prescribed Dose: 25 Gy
Reference Point Dosage Given to Date: 17.5 Gy
Reference Point Session Dosage Given: 2.5 Gy
Session Number: 7

## 2022-03-10 MED ORDER — PROCHLORPERAZINE MALEATE 10 MG PO TABS: 10.0000 mg | ORAL_TABLET | Freq: Four times a day (QID) | ORAL | 0 refills | Status: DC | PRN

## 2022-03-11 ENCOUNTER — Ambulatory Visit
Admission: RE | Admit: 2022-03-11 | Discharge: 2022-03-11 | Disposition: A | Discharge status: Home / Self Care | Payer: PPO | Source: Ambulatory Visit | Attending: Radiation Oncology | Admitting: Radiation Oncology

## 2022-03-11 ENCOUNTER — Other Ambulatory Visit: Payer: Self-pay

## 2022-03-11 DIAGNOSIS — Z510 Encounter for antineoplastic radiation therapy: Secondary | ICD-10-CM | POA: Diagnosis not present

## 2022-03-11 LAB — RAD ONC ARIA SESSION SUMMARY
Course Elapsed Days: 13
Plan Fractions Treated to Date: 8
Plan Prescribed Dose Per Fraction: 2.5 Gy
Plan Total Fractions Prescribed: 10
Plan Total Prescribed Dose: 25 Gy
Reference Point Dosage Given to Date: 20 Gy
Reference Point Session Dosage Given: 2.5 Gy
Session Number: 8

## 2022-03-12 ENCOUNTER — Ambulatory Visit
Admission: RE | Admit: 2022-03-12 | Discharge: 2022-03-12 | Disposition: A | Discharge status: Home / Self Care | Payer: PPO | Source: Ambulatory Visit | Attending: Radiation Oncology | Admitting: Radiation Oncology

## 2022-03-12 ENCOUNTER — Other Ambulatory Visit: Payer: Self-pay

## 2022-03-12 ENCOUNTER — Ambulatory Visit: Payer: PPO

## 2022-03-12 DIAGNOSIS — Z510 Encounter for antineoplastic radiation therapy: Secondary | ICD-10-CM | POA: Diagnosis not present

## 2022-03-12 LAB — RAD ONC ARIA SESSION SUMMARY
Course Elapsed Days: 14
Plan Fractions Treated to Date: 9
Plan Prescribed Dose Per Fraction: 2.5 Gy
Plan Total Fractions Prescribed: 10
Plan Total Prescribed Dose: 25 Gy
Reference Point Dosage Given to Date: 22.5 Gy
Reference Point Session Dosage Given: 2.5 Gy
Session Number: 9

## 2022-03-13 ENCOUNTER — Ambulatory Visit
Admission: RE | Admit: 2022-03-13 | Discharge: 2022-03-13 | Disposition: A | Discharge status: Home / Self Care | Payer: PPO | Source: Ambulatory Visit | Attending: Radiation Oncology | Admitting: Radiation Oncology

## 2022-03-13 ENCOUNTER — Encounter: Payer: Self-pay | Admitting: Radiation Oncology

## 2022-03-13 ENCOUNTER — Other Ambulatory Visit: Payer: Self-pay

## 2022-03-13 DIAGNOSIS — Z510 Encounter for antineoplastic radiation therapy: Secondary | ICD-10-CM | POA: Diagnosis not present

## 2022-03-13 DIAGNOSIS — Z298 Encounter for other specified prophylactic measures: Secondary | ICD-10-CM | POA: Diagnosis not present

## 2022-03-13 DIAGNOSIS — C3412 Malignant neoplasm of upper lobe, left bronchus or lung: Secondary | ICD-10-CM | POA: Diagnosis not present

## 2022-03-13 DIAGNOSIS — J3801 Paralysis of vocal cords and larynx, unilateral: Secondary | ICD-10-CM | POA: Diagnosis not present

## 2022-03-13 DIAGNOSIS — C3490 Malignant neoplasm of unspecified part of unspecified bronchus or lung: Secondary | ICD-10-CM | POA: Diagnosis not present

## 2022-03-13 DIAGNOSIS — Z87891 Personal history of nicotine dependence: Secondary | ICD-10-CM | POA: Diagnosis not present

## 2022-03-13 LAB — RAD ONC ARIA SESSION SUMMARY
Course Elapsed Days: 15
Plan Fractions Treated to Date: 10
Plan Prescribed Dose Per Fraction: 2.5 Gy
Plan Total Fractions Prescribed: 10
Plan Total Prescribed Dose: 25 Gy
Reference Point Dosage Given to Date: 25 Gy
Reference Point Session Dosage Given: 2.5 Gy
Session Number: 10

## 2022-03-23 DIAGNOSIS — D2361 Other benign neoplasm of skin of right upper limb, including shoulder: Secondary | ICD-10-CM | POA: Diagnosis not present

## 2022-03-23 DIAGNOSIS — L720 Epidermal cyst: Secondary | ICD-10-CM | POA: Diagnosis not present

## 2022-03-23 DIAGNOSIS — L814 Other melanin hyperpigmentation: Secondary | ICD-10-CM | POA: Diagnosis not present

## 2022-03-23 DIAGNOSIS — L821 Other seborrheic keratosis: Secondary | ICD-10-CM | POA: Diagnosis not present

## 2022-03-23 DIAGNOSIS — L570 Actinic keratosis: Secondary | ICD-10-CM | POA: Diagnosis not present

## 2022-03-27 ENCOUNTER — Telehealth: Payer: Self-pay | Admitting: *Deleted

## 2022-03-27 NOTE — Telephone Encounter (Signed)
RETURNED PATIENT'S PHONE CALL, SPOKE WITH PATIENT. ?

## 2022-03-30 ENCOUNTER — Other Ambulatory Visit: Payer: Self-pay | Admitting: Otolaryngology

## 2022-04-02 ENCOUNTER — Encounter (HOSPITAL_COMMUNITY): Payer: Self-pay | Admitting: Otolaryngology

## 2022-04-02 NOTE — Progress Notes (Signed)
Jonathan Castro denies chest pain or shortness of breath. Patient denies having any s/s of Covid in his household, also denies any known exposure to Covid.  Jonathan Castro PCP is Dr. [name].

## 2022-04-03 ENCOUNTER — Ambulatory Visit (HOSPITAL_COMMUNITY): Payer: PPO | Admitting: Anesthesiology

## 2022-04-03 ENCOUNTER — Encounter (HOSPITAL_COMMUNITY): Payer: Self-pay | Admitting: Otolaryngology

## 2022-04-03 ENCOUNTER — Ambulatory Visit (HOSPITAL_COMMUNITY)
Admission: RE | Admit: 2022-04-03 | Discharge: 2022-04-03 | Disposition: A | Discharge status: Home / Self Care | Payer: PPO | Source: Ambulatory Visit | Attending: Otolaryngology | Admitting: Otolaryngology

## 2022-04-03 ENCOUNTER — Encounter (HOSPITAL_COMMUNITY)
Admission: RE | Disposition: A | Discharge status: Home / Self Care | Payer: Self-pay | Source: Ambulatory Visit | Attending: Otolaryngology

## 2022-04-03 ENCOUNTER — Ambulatory Visit (HOSPITAL_BASED_OUTPATIENT_CLINIC_OR_DEPARTMENT_OTHER): Payer: PPO | Admitting: Anesthesiology

## 2022-04-03 DIAGNOSIS — R490 Dysphonia: Secondary | ICD-10-CM | POA: Diagnosis not present

## 2022-04-03 DIAGNOSIS — J3801 Paralysis of vocal cords and larynx, unilateral: Secondary | ICD-10-CM | POA: Diagnosis not present

## 2022-04-03 DIAGNOSIS — M1990 Unspecified osteoarthritis, unspecified site: Secondary | ICD-10-CM

## 2022-04-03 DIAGNOSIS — C3492 Malignant neoplasm of unspecified part of left bronchus or lung: Secondary | ICD-10-CM | POA: Diagnosis not present

## 2022-04-03 DIAGNOSIS — Z9221 Personal history of antineoplastic chemotherapy: Secondary | ICD-10-CM | POA: Insufficient documentation

## 2022-04-03 DIAGNOSIS — Z923 Personal history of irradiation: Secondary | ICD-10-CM | POA: Insufficient documentation

## 2022-04-03 DIAGNOSIS — Z87891 Personal history of nicotine dependence: Secondary | ICD-10-CM | POA: Diagnosis not present

## 2022-04-03 DIAGNOSIS — M069 Rheumatoid arthritis, unspecified: Secondary | ICD-10-CM | POA: Diagnosis not present

## 2022-04-03 DIAGNOSIS — J449 Chronic obstructive pulmonary disease, unspecified: Secondary | ICD-10-CM | POA: Diagnosis not present

## 2022-04-03 DIAGNOSIS — Z1152 Encounter for screening for COVID-19: Secondary | ICD-10-CM | POA: Diagnosis not present

## 2022-04-03 DIAGNOSIS — C3490 Malignant neoplasm of unspecified part of unspecified bronchus or lung: Secondary | ICD-10-CM | POA: Diagnosis not present

## 2022-04-03 HISTORY — PX: MICROLARYNGOSCOPY W/VOCAL CORD INJECTION: SHX2665

## 2022-04-03 LAB — BASIC METABOLIC PANEL
Anion gap: 12 (ref 5–15)
BUN: 13 mg/dL (ref 8–23)
CO2: 23 mmol/L (ref 22–32)
Calcium: 9.3 mg/dL (ref 8.9–10.3)
Chloride: 99 mmol/L (ref 98–111)
Creatinine, Ser: 0.97 mg/dL (ref 0.61–1.24)
GFR, Estimated: >60 mL/min (ref >60)
Glucose, Bld: 96 mg/dL (ref 70–99)
Potassium: 3.7 mmol/L (ref 3.5–5.1)
Sodium: 134 mmol/L — ABNORMAL LOW (ref 135–145)

## 2022-04-03 LAB — CBC
HCT: 40.4 % (ref 39.0–52.0)
Hemoglobin: 14.1 g/dL (ref 13.0–17.0)
MCH: 33.4 pg (ref 26.0–34.0)
MCHC: 34.9 g/dL (ref 30.0–36.0)
MCV: 95.7 fL (ref 80.0–100.0)
Platelets: 149 10*3/uL — ABNORMAL LOW (ref 150–400)
RBC: 4.22 MIL/uL (ref 4.22–5.81)
RDW: 12.1 % (ref 11.5–15.5)
WBC: 4.1 10*3/uL (ref 4.0–10.5)
nRBC: 0 % (ref 0.0–0.2)

## 2022-04-03 LAB — SARS CORONAVIRUS 2 BY RT PCR: SARS Coronavirus 2 by RT PCR: NEGATIVE

## 2022-04-03 SURGERY — MICROLARYNGOSCOPY, WITH VOCAL CORD INJECTION
Anesthesia: General | Laterality: Left

## 2022-04-03 SURGERY — Surgical Case
Anesthesia: *Unknown

## 2022-04-03 MED ORDER — ONDANSETRON HCL 4 MG/2ML IJ SOLN
INTRAMUSCULAR | Status: DC | PRN
  Administered 2022-04-03: 4 mg via INTRAVENOUS

## 2022-04-03 MED ORDER — LACTATED RINGERS IV SOLN: INTRAVENOUS | Status: DC

## 2022-04-03 MED ORDER — ACETAMINOPHEN 10 MG/ML IV SOLN
INTRAVENOUS | Status: AC
  Filled 2022-04-03: qty 100

## 2022-04-03 MED ORDER — PHENYLEPHRINE 80 MCG/ML (10ML) SYRINGE FOR IV PUSH (FOR BLOOD PRESSURE SUPPORT)
PREFILLED_SYRINGE | INTRAVENOUS | Status: DC | PRN
  Administered 2022-04-03: 160 ug via INTRAVENOUS

## 2022-04-03 MED ORDER — OXYCODONE HCL 5 MG PO TABS
ORAL_TABLET | ORAL | Status: AC
  Filled 2022-04-03: qty 1

## 2022-04-03 MED ORDER — AMISULPRIDE (ANTIEMETIC) 5 MG/2ML IV SOLN: 10.0000 mg | Freq: Once | INTRAVENOUS | Status: DC | PRN

## 2022-04-03 MED ORDER — PROPOFOL 10 MG/ML IV BOLUS
INTRAVENOUS | Status: AC
  Filled 2022-04-03: qty 20

## 2022-04-03 MED ORDER — SUGAMMADEX SODIUM 200 MG/2ML IV SOLN
INTRAVENOUS | Status: DC | PRN
  Administered 2022-04-03: 200 mg via INTRAVENOUS

## 2022-04-03 MED ORDER — ROCURONIUM BROMIDE 10 MG/ML (PF) SYRINGE
PREFILLED_SYRINGE | INTRAVENOUS | Status: DC | PRN
  Administered 2022-04-03: 10 mg via INTRAVENOUS
  Administered 2022-04-03: 20 mg via INTRAVENOUS

## 2022-04-03 MED ORDER — OXYCODONE HCL 5 MG PO TABS
5.0000 mg | ORAL_TABLET | Freq: Once | ORAL | Status: AC | PRN
  Administered 2022-04-03: 5 mg via ORAL

## 2022-04-03 MED ORDER — DEXAMETHASONE SODIUM PHOSPHATE 10 MG/ML IJ SOLN
INTRAMUSCULAR | Status: DC | PRN
  Administered 2022-04-03: 8 mg via INTRAVENOUS

## 2022-04-03 MED ORDER — LIDOCAINE 2% (20 MG/ML) 5 ML SYRINGE
INTRAMUSCULAR | Status: DC | PRN
  Administered 2022-04-03: 60 mg via INTRAVENOUS
  Administered 2022-04-03: 100 mg via INTRAVENOUS

## 2022-04-03 MED ORDER — FENTANYL CITRATE (PF) 100 MCG/2ML IJ SOLN: 25.0000 ug | INTRAMUSCULAR | Status: DC | PRN

## 2022-04-03 MED ORDER — SUCCINYLCHOLINE CHLORIDE 200 MG/10ML IV SOSY
PREFILLED_SYRINGE | INTRAVENOUS | Status: DC | PRN
  Administered 2022-04-03: 100 mg via INTRAVENOUS

## 2022-04-03 MED ORDER — FENTANYL CITRATE (PF) 250 MCG/5ML IJ SOLN
INTRAMUSCULAR | Status: DC | PRN
  Administered 2022-04-03: 100 ug via INTRAVENOUS

## 2022-04-03 MED ORDER — CHLORHEXIDINE GLUCONATE 0.12 % MT SOLN
15.0000 mL | OROMUCOSAL | Status: AC
  Administered 2022-04-03: 15 mL via OROMUCOSAL
  Filled 2022-04-03: qty 15

## 2022-04-03 MED ORDER — ACETAMINOPHEN 10 MG/ML IV SOLN
1000.0000 mg | Freq: Once | INTRAVENOUS | Status: DC | PRN
  Administered 2022-04-03: 1000 mg via INTRAVENOUS

## 2022-04-03 MED ORDER — 0.9 % SODIUM CHLORIDE (POUR BTL) OPTIME
TOPICAL | Status: DC | PRN
  Administered 2022-04-03: 1000 mL

## 2022-04-03 MED ORDER — EPINEPHRINE HCL (NASAL) 0.1 % NA SOLN
NASAL | Status: AC
  Filled 2022-04-03: qty 30

## 2022-04-03 MED ORDER — FENTANYL CITRATE (PF) 250 MCG/5ML IJ SOLN
INTRAMUSCULAR | Status: AC
  Filled 2022-04-03: qty 5

## 2022-04-03 MED ORDER — OXYCODONE HCL 5 MG/5ML PO SOLN: 5.0000 mg | Freq: Once | ORAL | Status: AC | PRN

## 2022-04-03 MED ORDER — MIDAZOLAM HCL 2 MG/2ML IJ SOLN
INTRAMUSCULAR | Status: AC
  Filled 2022-04-03: qty 2

## 2022-04-03 MED ORDER — PROPOFOL 10 MG/ML IV BOLUS
INTRAVENOUS | Status: DC | PRN
  Administered 2022-04-03: 110 mg via INTRAVENOUS

## 2022-04-03 SURGICAL SUPPLY — 24 items
BALLN PULM 12 13.5 15X75 (BALLOONS)
BALLN PULMONARY 10-12 (MISCELLANEOUS) IMPLANT
BALLOON PULM 12 13.5 15X75 (BALLOONS) IMPLANT
BLADE SURG 15 STRL LF DISP TIS (BLADE) IMPLANT
BLADE SURG 15 STRL SS (BLADE)
CANISTER SUCT 3000ML PPV (MISCELLANEOUS) ×1 IMPLANT
COVER BACK TABLE 60X90IN (DRAPES) ×1 IMPLANT
COVER MAYO STAND STRL (DRAPES) ×1 IMPLANT
DRAPE HALF SHEET 40X57 (DRAPES) ×1 IMPLANT
GLOVE SURG ENC MOIS LTX SZ6.5 (GLOVE) ×1 IMPLANT
GOWN STRL REUS W/ TWL LRG LVL3 (GOWN DISPOSABLE) IMPLANT
GOWN STRL REUS W/TWL LRG LVL3 (GOWN DISPOSABLE)
KIT BASIN OR (CUSTOM PROCEDURE TRAY) ×1 IMPLANT
KIT PROLARN PLUS GEL W/NDL (Prosthesis and Implant ENT) IMPLANT
KIT TURNOVER KIT B (KITS) ×1 IMPLANT
NDL HYPO 25GX1X1/2 BEV (NEEDLE) IMPLANT
NEEDLE HYPO 25GX1X1/2 BEV (NEEDLE) IMPLANT
NS IRRIG 1000ML POUR BTL (IV SOLUTION) ×1 IMPLANT
PAD ARMBOARD 7.5X6 YLW CONV (MISCELLANEOUS) ×2 IMPLANT
POSITIONER HEAD DONUT 9IN (MISCELLANEOUS) IMPLANT
SOL ANTI FOG 6CC (MISCELLANEOUS) ×1 IMPLANT
SOLUTION ANTI FOG 6CC (MISCELLANEOUS) ×1
TOWEL GREEN STERILE FF (TOWEL DISPOSABLE) ×1 IMPLANT
TUBE CONNECTING 12X1/4 (SUCTIONS) ×1 IMPLANT

## 2022-04-03 NOTE — Op Note (Signed)
OPERATIVE NOTE  Jonathan Castro Date/Time of Admission: 04/03/2022  8:44 AM  CSN: [id number];BOB:[id number] Attending Provider: Ebbie Latus A, DO Room/Bed: MCPO/NONE DOB: Aug 23, 1955 Age: 66 y.o.   Pre-Op Diagnosis: Vocal fold paresis, left; hoarseness  Post-Op Diagnosis: Vocal fold paresis, left; hoarseness  Procedure: Procedure(s): MICROLARYNGOSCOPY WITH MEDIALIZATION OF LEFT VOCAL FOLD WITH PROLARYN  Anesthesia: General  Surgeon(s): [place], DO  Staff: Circulator: Yves Dill D, RN Scrub Person: Zannie Kehr, RN; Criss Alvine, RN  Implants: Implant Name Type Inv. Item Serial No. Manufacturer Lot No. LRB No. Used Action  KIT PROLARN PLUS GEL W/NDL - [id number] Prosthesis and Implant ENT KIT PROLARN PLUS GEL W/NDL  [employer] [id number] Left 1 Implanted    Specimens: * No specimens in log *  Complications: None  EBL: 0 ML  Condition: stable  Operative Findings:  Paramedian left true vocal fold  Description of Operation: Once operative consent was obtained, and the surgical site confirmed with the operating room team, the patient was brought back to the operating room and general endotracheal anesthesia was obtained. The patient was turned over to the ENT service. An operating laryngoscope was used to directly visualize the upper airway and glottis. All anatomic areas from the oral cavity to the glottis were examined and noted to be normal with only exceptions noted in this report. Areas examined included the oropharynx, vallecula,both surfaces of the epiglottis, glottis, post cricoid region and bilateral pyriform sinuses.   The patient was placed in laryngeal suspension with the focus on the glottis with the ET tube intact. A zero degree endoscope was used to visualize the vocal cords and Prolaryn Plus 0.8 ml was injected into the paraglottic space medial to the ventricle of the left true vocal cord. 0.55 mL was placed lateral to the vocal  process and then 0.25 mL was placed half way between the vocal process and the anterior commissure. Following this, the left true vocal fold appeared appropriately medialized. Hemostasis was obtained with an Afrin soaked pledget. The patient was turned back over to the anesthesia service. The patient was transferred to the PACU in stable condition.    Jason Coop, [facility]  04/03/2022

## 2022-04-03 NOTE — Anesthesia Preprocedure Evaluation (Addendum)
Anesthesia Evaluation  Patient identified by MRN, date of birth, ID band Patient awake    Reviewed: Allergy & Precautions, NPO status , Patient's Chart, lab work & pertinent test results  History of Anesthesia Complications Negative for: history of anesthetic complications  Airway Mallampati: II  TM Distance: >3 FB Neck ROM: Full    Dental  (+) Missing,    Pulmonary COPD,  COPD inhaler, Patient abstained from smoking., former smoker   Pulmonary exam normal        Cardiovascular negative cardio ROS Normal cardiovascular exam     Neuro/Psych negative neurological ROS  negative psych ROS   GI/Hepatic Neg liver ROS,GERD  ,,  Endo/Other  negative endocrine ROS    Renal/GU negative Renal ROS  negative genitourinary   Musculoskeletal  (+) Arthritis , Rheumatoid disorders,    Abdominal   Peds  Hematology negative hematology ROS (+)   Anesthesia Other Findings Vocal fold paresis, left  Reproductive/Obstetrics negative OB ROS                             Anesthesia Physical Anesthesia Plan  ASA: 3  Anesthesia Plan: General   Post-op Pain Management: Ofirmev IV (intra-op)*   Induction: Intravenous  PONV Risk Score and Plan: 2 and Treatment may vary due to age or medical condition, Ondansetron, Dexamethasone and Midazolam  Airway Management Planned: Oral ETT  Additional Equipment: None  Intra-op Plan:   Post-operative Plan: Extubation in OR  Informed Consent: I have reviewed the patients History and Physical, chart, labs and discussed the procedure including the risks, benefits and alternatives for the proposed anesthesia with the patient or authorized representative who has indicated his/her understanding and acceptance.     Dental advisory given  Plan Discussed with: CRNA  Anesthesia Plan Comments:         Anesthesia Quick Evaluation

## 2022-04-03 NOTE — H&P (Signed)
Jonathan Castro is an 66 y.o. male.    Chief Complaint:  Hoarseness, left vocal fold paresis  HPI: Patient presents today for planned elective procedure.  He denies any interval change in history since office visit on 03/13/2022:  Jonathan Castro is a 66 y.o. male who presents as a return consult, referred by Marina Goodell, MD, for evaluation and treatment of hoarseness. Patient was last seen in our office by Pietro Cassis, PA for same issue on 11/26/2021. He underwent nasolaryngoscopy at that visit, which demonstrated left vocal fold paresis. Patient states that he underwent bronchoscopy and mediastinal Sk[place] in April, with hoarseness since that time. He has completed chemo and radiation treatment for small cell carcinoma. He states that he recently underwent preventative radiation treatments to his brain, which she just completed yesterday. He denies choking or coughing with oral intake, but states that his appetite has been markedly reduced with ongoing radiation therapy. He is a former smoker, and quit several months ago. Prior to quitting he had a 40+ pack year history. He states that since last visit, his vocal quality has been unchanged.   Past Medical History:  Diagnosis Date   Arthritis    Cancer ([place])    COPD (chronic obstructive pulmonary disease) ([place])    History of radiation therapy    Left Lung- 11/25/21-01/06/22- Dr. Gery Pray    Past Surgical History:  Procedure Laterality Date   MEDIASTINOSCOPY N/A 10/20/2021   Procedure: MEDIASTINOSCOPY;  Surgeon: Melrose Nakayama, MD;  Location: [facility] OR;  Service: Thoracic;  Laterality: N/A;   ROTATOR CUFF REPAIR Left    VIDEO BRONCHOSCOPY WITH ENDOBRONCHIAL ULTRASOUND N/A 10/20/2021   Procedure: VIDEO BRONCHOSCOPY WITH ENDOBRONCHIAL ULTRASOUND WITH BIOPSIES;  Surgeon: Melrose Nakayama, MD;  Location: [facility] OR;  Service: Thoracic;  Laterality: N/A;    Family History  Problem Relation Age of Onset   Cancer - Lung Father      Social History:  reports that he quit smoking about 5 months ago. His smoking use included cigarettes. He has a 52.00 pack-year smoking history. He has never been exposed to tobacco smoke. He has never used smokeless tobacco. He reports current alcohol use of about 2.0 standard drinks of alcohol per week. He reports current drug use. Frequency: 2.00 times per week. Drug: Marijuana.  Allergies:  Allergies  Allergen Reactions   Penicillamine Other (See Comments)    Unknown to patient    Penicillins     Unknown childhood allergy    Arava [Leflunomide] Diarrhea    Intolerance    Medications Prior to Admission  Medication Sig Dispense Refill   acetaminophen (TYLENOL) 650 MG CR tablet Take 1,300 mg by mouth every 8 (eight) hours as needed for pain.     albuterol (VENTOLIN HFA) 108 (90 Base) MCG/ACT inhaler Inhale 2 puffs into the lungs every 6 (six) hours as needed for wheezing or shortness of breath.     fexofenadine-pseudoephedrine (ALLEGRA-D) 60-120 MG 12 hr tablet Take 1 tablet by mouth 2 (two) times daily as needed (allergies).     folic acid (FOLVITE) 1 MG tablet Take 1 mg by mouth daily.     mometasone (ELOCON) 0.1 % cream Apply 1 Application topically in the morning and at bedtime.     predniSONE (DELTASONE) 5 MG tablet Take 5 mg by mouth daily as needed (arthritis flare).     prochlorperazine (COMPAZINE) 10 MG tablet Take 1 tablet (10 mg total) by mouth every 6 (six) hours as needed  for nausea or vomiting. 30 tablet 0   chlorproMAZINE (THORAZINE) 25 MG tablet TAKE ONE TABLET (25MG  TOTAL) BY MOUTH THREE TIMES DAILY (Patient not taking: Reported on 03/30/2022) 30 tablet 1   doxycycline (VIBRA-TABS) 100 MG tablet Take 1 tablet (100 mg total) by mouth 2 (two) times daily. (Patient not taking: Reported on 03/30/2022) 20 tablet 0   lidocaine (XYLOCAINE) 2 % solution Use as directed 15 mLs in the mouth or throat as needed for mouth pain. Dll[place] per instructions given by nursing, swallow 20  min prior to meals and bedtime (Patient not taking: Reported on 03/30/2022) 100 mL 2   magnesium oxide (MAG-OX) 400 (240 Mg) MG tablet Take 1 tablet (400 mg total) by mouth daily. (Patient not taking: Reported on 02/09/2022) 20 tablet 0   methotrexate 50 MG/2ML injection Inject 15 mg into the vein every Wednesday.     oxyCODONE-acetaminophen (PERCOCET/ROXICET) 5-325 MG tablet Take 1 tablet by mouth every 8 (eight) hours as needed for severe pain. (Patient not taking: Reported on 02/09/2022) 30 tablet 0    Results for orders placed or performed during the hospital encounter of 04/03/22 (from the past 48 hour(s))  CBC     Status: Abnormal   Collection Time: 04/03/22  8:48 AM  Result Value Ref Range   WBC 4.1 4.0 - 10.5 K/uL   RBC 4.22 4.22 - 5.81 MIL/uL   Hemoglobin 14.1 13.0 - 17.0 g/dL   HCT 40.4 39.0 - 52.0 %   MCV 95.7 80.0 - 100.0 fL   MCH 33.4 26.0 - 34.0 pg   MCHC 34.9 30.0 - 36.0 g/dL   RDW 12.1 11.5 - 15.5 %   Platelets 149 (L) 150 - 400 K/uL   nRBC 0.0 0.0 - 0.2 %    Comment: Performed at [facility], [place] [address], [place], [place] [address]  SARS Coronavirus 2 by RT PCR (hospital order, performed in [facility] hospital lab) *cepheid single result test* Anterior Nasal Swab     Status: None   Collection Time: 04/03/22  8:48 AM   Specimen: Anterior Nasal Swab  Result Value Ref Range   SARS Coronavirus 2 by RT PCR NEGATIVE NEGATIVE    Comment: (NOTE) SARS-CoV-2 target nucleic acids are NOT DETECTED.  The SARS-CoV-2 RNA is generally detectable in upper and lower respiratory specimens during the acute phase of infection. The lowest concentration of SARS-CoV-2 viral copies this assay can detect is 250 copies / mL. A negative result does not preclude SARS-CoV-2 infection and should not be used as the sole basis for treatment or other patient management decisions.  A negative result may occur with improper specimen collection / handling, submission of specimen other than  nasopharyngeal swab, presence of viral mutation(s) within the areas targeted by this assay, and inadequate number of viral copies (<250 copies / mL). A negative result must be combined with clinical observations, patient history, and epidemiological information.  Fact Sheet for Patients:   https://www.patel.info/  Fact Sheet for Healthcare Providers: https://hall.com/  This test is not yet approved or  cleared by the [place] FDA and has been authorized for detection and/or diagnosis of SARS-CoV-2 by FDA under an Emergency Use Authorization (EUA).  This EUA will remain in effect (meaning this test can be used) for the duration of the COVID-19 declaration under Section 564(b)(1) of the Act, 21 U.S.C. section 360bbb-3(b)(1), unless the authorization is terminated or revoked sooner.  Performed at [facility], [place] [address],  [place],  [address]    No results found.  ROS: ROS  Blood pressure 117/70, pulse 64, temperature 98 F (36.7 C), temperature source Oral, resp. rate 18, height 5\' 7"  (1.702 m), weight 68 kg, SpO2 97 %.  PHYSICAL EXAM: Physical Exam HENT:     Head: Normocephalic and atraumatic.     Right Ear: External ear normal.     Left Ear: External ear normal.     Mouth/Throat:     Mouth: Mucous membranes are moist.  Pulmonary:     Effort: Pulmonary effort is normal.  Neurological:     General: No focal deficit present.     Mental Status: He is alert. Mental status is at baseline.  Psychiatric:        Mood and Affect: Mood normal.        Behavior: Behavior normal.     Studies Reviewed: None   Assessment/Plan Cheyne Boulden is a 66 y.o. male with history of small cell carcinoma of the lung, status post completion of chemoradiation therapy with hoarseness and known left vocal fold paresis.  -To OR today for injection medialization laryngoplasty of left true vocal fold with Prolaryn. Risks of surgery,  benefits as well as expected postoperative course and recovery were reviewed with patient. All questions answered.    Davina Howlett A Tyland Klemens 04/03/2022, 9:44 AM

## 2022-04-03 NOTE — Anesthesia Postprocedure Evaluation (Signed)
Anesthesia Post Note  Patient: Jonathan Castro  Procedure(s) Performed: MICROLARYNGOSCOPY WITH MEDIALIZATION OF LEFT VOCAL FOLD WITH PROLARYN (Left)     Patient location during evaluation: PACU Anesthesia Type: General Level of consciousness: awake and alert Pain management: pain level controlled Vital Signs Assessment: post-procedure vital signs reviewed and stable Respiratory status: spontaneous breathing, nonlabored ventilation and respiratory function stable Cardiovascular status: blood pressure returned to baseline Postop Assessment: no apparent nausea or vomiting Anesthetic complications: no   No notable events documented.  Last Vitals:  Vitals:   04/03/22 1245 04/03/22 1300  BP: (!) 144/87 139/74  Pulse: 64 75  Resp: 12 16  Temp:  36.5 C  SpO2: 99% 95%    Last Pain:  Vitals:   04/03/22 1300  TempSrc:   PainSc: 0-No pain                 Marthenia Rolling

## 2022-04-03 NOTE — Anesthesia Procedure Notes (Signed)
Procedure Name: Intubation Date/Time: 04/03/2022 12:14 PM  Performed by: Darletta Moll, CRNAPre-anesthesia Checklist: Patient identified, Emergency Drugs available, Suction available and Patient being monitored Patient Re-evaluated:Patient Re-evaluated prior to induction Oxygen Delivery Method: Circle system utilized Preoxygenation: Pre-oxygenation with 100% oxygen Induction Type: IV induction Ventilation: Mask ventilation without difficulty Laryngoscope Size: Mac and 4 Grade View: Grade I Tube type: MLT Tube size: 6.0 mm Number of attempts: 1 Airway Equipment and Method: Stylet and Oral airway Placement Confirmation: ETT inserted through vocal cords under direct vision, positive ETCO2 and breath sounds checked- equal and bilateral Secured at: 23 cm Tube secured with: Tape Dental Injury: Teeth and Oropharynx as per pre-operative assessment

## 2022-04-03 NOTE — Discharge Instructions (Signed)
[facility] POST OP INSTRUCTIONS: LARYNGOSCOPY  The Surgery Itself Laryngoscopy with biopsy or injection involves a brief general anesthesia, typically for  less than one hour. Patients may be sedated for several hours after surgery and may  remain sleepy for the better part of the day. Nausea and vomiting are occasionally seen,  and usually resolve by the evening of surgery - even without additional medications.  Almost all patients can go home the day of surgery.  After Surgery ? You will have a sore throat from the metal instruments used to allow a good view  of your voice box for 3-5 days after surgery. Some patients may have sores on  the tongue or in the mouth. This is normal and you will be given pain  medications for this. If you have sores in the mouth, avoid citrus or acidic  foods/drinks since they will burn.  ? Avoid coughing or frequent throat clearing. Drinking water can help alleviate the  urge to clear the throat. ? Avoid any heavy lifting (more than 20 lbs), straining, exercise, or sports activities  for 2 weeks after surgery. ? Avoid alcohol, tobacco products, spicy foods, or eating late at night as these may  cause heartburn or stomach reflux and may delay the healing process. ? Your voice may be hoarse after surgery from swelling of the vocal cords caused  by manipulation. ? You do not have to avoid talking unless your surgeon gives you specific  instructions. Use your normal voice since whispering is harder on your vocal  cords then talking at a regular volume. ? If your surgeon advises voice rest, you should do the following: o You are to have absolute voice rest for 7 days. You may want to purchase  a dry erase board to communicate during this time. After that, you may  begin to use your voice at a soft spoken level. o You should only speak loud enough for people to hear you that are within  an arm's length away. Do not yell or whisper. You may increase your   voice use by 5 minutes/hour each day after the first 7 days of rest.  Medications ? Pain medication can be used for pain as prescribed. Pain in the throat and the  tongue is normal. ? Some patients will be given steroids or acid reducing medications after surgery,  take these as directed. ? Take all of your routine medications as prescribed, unless told otherwise by your  surgeon. Any medications that thin the blood should be avoided unless approved  by your surgeon.  ? IT IS OK TO TAKE OVER THE COUNTER PAIN MEDICATION  (IBUPROFEN, NAPROXEN, or ACETAMINOPHEN) IN ADDITION TO  YOUR PRESCRIBED MEDICATIONS. DO NOT TAKE ASPIRIN UNLESS  CLEARED WITH YOUR SURGEON.  ? Limit Acetaminophen/Tylenol to less than 4,000mg /day  ? Limit Ibuprofen/Motrin to less than 3,600mg /day  Final Result ? Following vocal cord injection, the voice may seem "strangled" due to swelling for  a few days or weeks. This is normal.  ? If you have a biopsy in surgery, you will usually find out the pathology results  when you are seen in the office for follow up. ? Many patients benefit from voice therapy after surgery to improve the long term  result. Your surgeon will recommend this if you could benefit from it

## 2022-04-03 NOTE — Transfer of Care (Signed)
Immediate Anesthesia Transfer of Care Note  Patient: Jonathan Castro  Procedure(s) Performed: MICROLARYNGOSCOPY WITH MEDIALIZATION OF LEFT VOCAL FOLD WITH PROLARYN (Left)  Patient Location: PACU  Anesthesia Type:General  Level of Consciousness: drowsy and patient cooperative  Airway & Oxygen Therapy: Patient Spontanous Breathing and Patient connected to nasal cannula oxygen  Post-op Assessment: Report given to RN, Post -op Vital signs reviewed and stable and Patient moving all extremities X 4  Post vital signs: Reviewed and stable  Last Vitals:  Vitals Value Taken Time  BP 137/74 04/03/22 1220  Temp    Pulse 73 04/03/22 1223  Resp 15 04/03/22 1223  SpO2 100 % 04/03/22 1223  Vitals shown include unvalidated device data.  Last Pain:  Vitals:   04/03/22 0911  TempSrc:   PainSc: 0-No pain      Patients Stated Pain Goal: 0 (27/51/70 0174)  Complications: No notable events documented.

## 2022-04-04 ENCOUNTER — Encounter (HOSPITAL_COMMUNITY): Payer: Self-pay | Admitting: Otolaryngology

## 2022-04-15 ENCOUNTER — Encounter: Payer: Self-pay | Admitting: Radiation Oncology

## 2022-04-15 NOTE — Progress Notes (Incomplete)
  Radiation Oncology         [phone] ________________________________  Patient Name: Jonathan Castro MRN: [id number] DOB: 03-03-1956 Referring Physician: Curt Bears (Profile Not Attached) Date of Service: 03/13/2022 [facility], [place]                                                        End Of Treatment Note  Diagnoses: C34.12-Malignant neoplasm of upper lobe, left bronchus or lung  Cancer Staging: The encounter diagnosis was Primary small cell carcinoma of upper lobe of left lung ([place]).   Large left upper lobe pulmonary nodule: small cell carcinoma with lymph node metastases   Cancer Staging  Primary small cell carcinoma of upper lobe of left lung ([facility]) Staging form: Lung, AJCC 8th Edition - Clinical: Stage IIIA (cT1c, cN2, cM0) - Signed by Curt Bears, MD on 10/30/2021  Intent: Curative  Radiation Treatment Dates: 02/26/2022 through 03/13/2022 Site Technique Total Dose (Gy) Dose per Fx (Gy) Completed Fx Beam Energies  Brain: Brain IMRT 25/25 2.5 10/10 6X   Narrative: The patient tolerated radiation therapy relatively well. During his final weekly treatment check on 03/10/22, the patient reported trouble swallowing, mild cognitive changes, nausea (started taking a probiotic), and a small amount of hyperpigmenation on his forehead/temples. Physical exam performed on that same date showed minimal skin changes along the scalp. The oral cavity also appeared to be free from secondary infection. For his nausea we also refilled his Compazine. He was also offered Carafate for his dysphagia but declined.   Plan: The patient will follow-up with radiation oncology in one month.  ________________________________________________ -----------------------------------  Blair Promise, PhD, MD  This document serves as a record of services personally performed by Gery Pray, MD. It was created on his behalf by Jonathan Castro, a trained medical scribe. The creation  of this record is based on the scribe's personal observations and the provider's statements to them. This document has been checked and approved by the attending provider.

## 2022-04-15 NOTE — Progress Notes (Signed)
Radiation Oncology         [phone] ________________________________  Name: Jonathan Castro MRN: [id number]  Date: 04/16/2022  DOB: May 27, 1956  Follow-Up Visit Note  CC: [name], MD  Curt Bears, MD  No diagnosis found.  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   Large left upper lobe pulmonary nodule: small cell carcinoma with lymph node metastases   Cancer Staging  Primary small cell carcinoma of upper lobe of left lung (HCC) Staging form: Lung, AJCC 8th Edition - Clinical: Stage IIIA (cT1c, cN2, cM0) - Signed by Curt Bears, MD on 10/30/2021  Interval Since Last Radiation: 1 month and 4 days   Prophylactic cranial radiation  Intent: Curative Radiation Treatment Dates: 02/26/2022 through 03/13/2022 Site Technique Total Dose (Gy) Dose per Fx (Gy) Completed Fx Beam Energies  Brain: Brain IMRT 25/25 2.5 10/10 6X   Intent: Curative Radiation Treatment Dates: 11/25/2021 through 01/06/2022 Site Technique Total Dose (Gy) Dose per Fx (Gy) Completed Fx Beam Energies  Lung, Left: Lung_L 3D 60/60 2 30/30 6X, 10X    Narrative:  The patient returns today for routine follow-up.  The patient tolerated prophylactic cranial radiation relatively well. During his final weekly treatment check on 03/10/22, the patient reported trouble swallowing, mild cognitive changes, nausea (started taking a probiotic), and a small amount of hyperpigmenation on his forehead/temples. Physical exam performed on that same date showed minimal skin changes along the scalp. The oral cavity also appeared to be free from secondary infection. For his nausea we also refilled his Compazine. He was also offered Carafate for his dysphagia but declined.      On the date of his final treatment, the patient also met with Dr. Fredric Dine (otolaryngology) for follow-up of her vocal hoarseness and known left vocal fold paresis. Nasolaryngoscopy performed  during this visit showed the left vocal fold fixed in a paramedian position, with persistent glottic gap and with attempted phonation. Supraglottic compression was also noted with attempted phonation. The hypopharynx otherwise appeared normal without mass, pooling of secretions, or aspiration.   Given the above findings and that the patient's hoarseness had only been present for 6 months, Dr. Fredric Dine recommended proceeding with an injection medialization laryngoplasty with prolaryn which was performed on 04/03/22.    Pertinent imaging performed in the interval since the patient was seen for follow-up on 02/09/22 includes an MRI of the brain with and without contrast on 02/17/22 (prior to brain RT) which showed no evidence of intracranial metastatic disease.   ***                         Allergies:  is allergic to penicillamine, penicillins, and arava [leflunomide].  Meds: Current Outpatient Medications  Medication Sig Dispense Refill   acetaminophen (TYLENOL) 650 MG CR tablet Take 1,300 mg by mouth every 8 (eight) hours as needed for pain.     albuterol (VENTOLIN HFA) 108 (90 Base) MCG/ACT inhaler Inhale 2 puffs into the lungs every 6 (six) hours as needed for wheezing or shortness of breath.     chlorproMAZINE (THORAZINE) 25 MG tablet TAKE ONE TABLET (25MG TOTAL) BY MOUTH THREE TIMES DAILY (Patient not taking: Reported on 03/30/2022) 30 tablet 1   doxycycline (VIBRA-TABS) 100 MG tablet Take 1 tablet (100 mg total) by mouth 2 (two) times daily. (Patient not taking: Reported on 03/30/2022) 20 tablet 0   fexofenadine-pseudoephedrine (ALLEGRA-D) 60-120 MG 12 hr  tablet Take 1 tablet by mouth 2 (two) times daily as needed (allergies).     folic acid (FOLVITE) 1 MG tablet Take 1 mg by mouth daily.     lidocaine (XYLOCAINE) 2 % solution Use as directed 15 mLs in the mouth or throat as needed for mouth pain. Dllute per instructions given by nursing, swallow 20 min prior to meals and bedtime (Patient not  taking: Reported on 03/30/2022) 100 mL 2   magnesium oxide (MAG-OX) 400 (240 Mg) MG tablet Take 1 tablet (400 mg total) by mouth daily. (Patient not taking: Reported on 02/09/2022) 20 tablet 0   methotrexate 50 MG/2ML injection Inject 15 mg into the vein every Wednesday.     mometasone (ELOCON) 0.1 % cream Apply 1 Application topically in the morning and at bedtime.     oxyCODONE-acetaminophen (PERCOCET/ROXICET) 5-325 MG tablet Take 1 tablet by mouth every 8 (eight) hours as needed for severe pain. (Patient not taking: Reported on 02/09/2022) 30 tablet 0   predniSONE (DELTASONE) 5 MG tablet Take 5 mg by mouth daily as needed (arthritis flare).     prochlorperazine (COMPAZINE) 10 MG tablet Take 1 tablet (10 mg total) by mouth every 6 (six) hours as needed for nausea or vomiting. 30 tablet 0   No current facility-administered medications for this encounter.    Physical Findings: The patient is in no acute distress. Patient is alert and oriented.  vitals were not taken for this visit. .  No significant changes. Lungs are clear to auscultation bilaterally. Heart has regular rate and rhythm. No palpable cervical, supraclavicular, or axillary adenopathy. Abdomen soft, non-tender, normal bowel sounds.   Lab Findings: Lab Results  Component Value Date   WBC 4.1 04/03/2022   HGB 14.1 04/03/2022   HCT 40.4 04/03/2022   MCV 95.7 04/03/2022   PLT 149 (L) 04/03/2022    Radiographic Findings: No results found.  Impression:  The encounter diagnosis was Primary small cell carcinoma of upper lobe of left lung ([place]).   Large left upper lobe pulmonary nodule: small cell carcinoma with lymph node metastases  The patient is recovering from the effects of radiation.  ***  Plan:  ***   *** minutes of total time was spent for this patient encounter, including preparation, face-to-face counseling with the patient and coordination of care, physical exam, and documentation of the  encounter. ____________________________________  Blair Promise, PhD, MD  This document serves as a record of services personally performed by Gery Pray, MD. It was created on his behalf by Roney Mans, a trained medical scribe. The creation of this record is based on the scribe's personal observations and the provider's statements to them. This document has been checked and approved by the attending provider.

## 2022-04-16 ENCOUNTER — Ambulatory Visit
Admission: RE | Admit: 2022-04-16 | Discharge: 2022-04-16 | Disposition: A | Discharge status: Home / Self Care | Payer: PPO | Source: Ambulatory Visit | Attending: Radiation Oncology | Admitting: Radiation Oncology

## 2022-04-16 VITALS — BP 101/68 | HR 70 | Temp 97.4°F | Resp 18 | Ht 67.0 in | Wt 150.0 lb

## 2022-04-16 DIAGNOSIS — C3490 Malignant neoplasm of unspecified part of unspecified bronchus or lung: Secondary | ICD-10-CM | POA: Diagnosis not present

## 2022-04-16 DIAGNOSIS — Z7951 Long term (current) use of inhaled steroids: Secondary | ICD-10-CM | POA: Diagnosis not present

## 2022-04-16 DIAGNOSIS — Z923 Personal history of irradiation: Secondary | ICD-10-CM | POA: Insufficient documentation

## 2022-04-16 DIAGNOSIS — C3412 Malignant neoplasm of upper lobe, left bronchus or lung: Secondary | ICD-10-CM | POA: Insufficient documentation

## 2022-04-16 DIAGNOSIS — Z79899 Other long term (current) drug therapy: Secondary | ICD-10-CM | POA: Insufficient documentation

## 2022-04-16 NOTE — Progress Notes (Addendum)
Jonathan Castro is here today for follow up post radiation to the brain.    They completed their radiation on: 03/13/22  Does the patient complain of any of the following: Headache: no Visual Changes: no Hearing Changes: yes - going to today to Dr. Fredric Dine for a hearing test. Nausea: yes - reports his stomach does not feel right.  He can only eat certain food and in small amounts.  He said the compazine did not help. Vomiting: no Balance or coordination issues: no Memory issues: no  Is the patient currently on a Decadron regimen? : no  Additional comments if applicable: He also reports having a low energy level and fatigue. He also has 3 sore areas on his chest.  If he applies Sonafine, the areas go away.  When he stops using it, they come back.  BP 101/68 (BP Location: Right Arm, Patient Position: Sitting, Cuff Size: Normal)   Pulse 70   Temp (!) 97.4 F (36.3 C)   Resp 18   Ht 5\' 7"  (1.702 m)   Wt 150 lb (68 kg)   SpO2 100%   BMI 23.49 kg/m

## 2022-04-17 ENCOUNTER — Other Ambulatory Visit: Payer: Self-pay

## 2022-04-21 ENCOUNTER — Encounter: Payer: Self-pay | Admitting: Internal Medicine

## 2022-05-05 ENCOUNTER — Ambulatory Visit: Payer: PPO | Attending: Otolaryngology

## 2022-05-05 DIAGNOSIS — R490 Dysphonia: Secondary | ICD-10-CM | POA: Diagnosis not present

## 2022-05-05 NOTE — Therapy (Signed)
OUTPATIENT SPEECH LANGUAGE PATHOLOGY MOTOR SPEECH EVALUATION   Patient Name: Jonathan Castro MRN: [id number] DOB:10-24-1955, 66 y.o., male Today's Date: 05/05/2022  PCP: [name] REFERRING PROVIDER: Ebbie Latus   End of Session - 05/05/22 1416     Visit Number 1    Number of Visits 6    Date for SLP Re-Evaluation 06/16/22    SLP Start Time 58    SLP Stop Time  7846    SLP Time Calculation (min) 45 min    Activity Tolerance Patient tolerated treatment well             Past Medical History:  Diagnosis Date   Arthritis    Cancer ([place])    COPD (chronic obstructive pulmonary disease) ([place])    History of radiation therapy    Left Lung- 11/25/21-01/06/22- Dr. Gery Pray   History of radiation therapy    Brain- 02/26/22-03/13/22- Dr. Gery Pray   Past Surgical History:  Procedure Laterality Date   MEDIASTINOSCOPY N/A 10/20/2021   Procedure: MEDIASTINOSCOPY;  Surgeon: Melrose Nakayama, MD;  Location[facility] OR;  Service: Thoracic;  Laterality: N/A;   MICROLARYNGOSCOPY W/VOCAL CORD INJECTION Left 04/03/2022   Procedure: MICROLARYNGOSCOPY WITH MEDIALIZATION OF LEFT VOCAL FOLD WITH PROLARYN;  Surgeon: Jason Coop, DO;  Location: [place];  Service: ENT;  Laterality: Left;   ROTATOR CUFF REPAIR Left    VIDEO BRONCHOSCOPY WITH ENDOBRONCHIAL ULTRASOUND N/A 10/20/2021   Procedure: VIDEO BRONCHOSCOPY WITH ENDOBRONCHIAL ULTRASOUND WITH BIOPSIES;  Surgeon: Melrose Nakayama, MD;  Location: [place];  Service: Thoracic;  Laterality: N/A;   Patient Active Problem List   Diagnosis Date Noted   Hoarseness of voice 04/03/2022   Primary small cell carcinoma of upper lobe of left lung ([place]) 10/30/2021   Encounter for antineoplastic chemotherapy 10/30/2021   Lung nodule 10/27/2021   DDD (degenerative disc disease), cervical 09/03/2021   Gastroesophageal reflux disease 09/03/2021   Hyperlipidemia 09/03/2021   Primary osteoarthritis 09/03/2021   Rheumatoid arthritis ([place])  09/03/2021   Periapical abscess of tooth with fistula 09/22/2016   Maxillary sinusitis, chronic 08/11/2016    Onset date: 4/23  REFERRING DIAG: hoarseness of voice and reduced vocal intensity.  THERAPY DIAG:  Hoarseness of voice  Rationale for Evaluation and Treatment: Rehabilitation  SUBJECTIVE:   SUBJECTIVE STATEMENT: " Hoarseness and volume of my voice is still different." Pt accompanied by: self  PERTINENT HISTORY: 4/23 bronchoscopy resulting in hoarseness and noted left vocal fold paresis post examination. 04/03/22 Microlaryngoscopy with medialization of left vocal fold with prolaryn.   PAIN:  Are you having pain? No  FALLS: Has patient fallen in last 6 months? No, Number of falls:   LIVING ENVIRONMENT: Lives with: lives with their family and lives alone Lives in: House/apartment  PLOF: Independent  PATIENT GOALS: " to get my voice back."  OBJECTIVE:   DIAGNOSTIC FINDINGS: hoarseness of voice and reduced vocal intensity.   COGNITION: Overall cognitive status: Within functional limits for tasks assessed Areas of impairment:  Memory: Impaired: Peoples names and pt compensate with a strategy Functional deficits:   SOCIAL HISTORY: Occupation: retried.  Water intake: optimal Caffeine/alcohol intake: excessive (caffeine)  Daily voice use: minimal  PERCEPTUAL VOICE ASSESSMENT: Voice quality: hoarse, low vocal intensity, and vocal fatigue Vocal abuse: n/a Resonance: normal Respiratory function: diaphragmatic/abdominal breathing  OBJECTIVE VOICE ASSESSMENT: Maximum phonation time for sustained "ah": 6 seconds ( 80 dB) Conversational pitch average and range: variation minimal, low pitch due to hoarseness Conversational loudness average: 64 dB Conversational  loudness range: 61-71 dB S/z ratio: 9/9 = 1  (Suggestive of dysfunction >1.0)  PATIENT REPORTED OUTCOME MEASURES (PROM): Will give next time due to time restraints.   TODAY'S TREATMENT:                                                                                                                                          DATE:  05-05-22: SLP administered vocal assessment and created plan with pt.   PATIENT EDUCATION: Education details: Insurance underwriter  Person educated: Patient Education method: Customer service manager Education comprehension: verbalized understanding  HOME EXERCISE PROGRAM: Vocal hygiene and will give HEP next visit.   GOALS: Goals reviewed with patient? Yes  SHORT TERM GOALS: Target date: 05/26/2022    Pt will complete HEP x1 a daily weekly Mod I.  Baseline: Goal status: INITIAL  2.  Pt will complete vocal exercises to increase pitch with supervision A verbal cues. Baseline:  Goal status: INITIAL  3.  Pt will complete vocal exercises to increase vocal intensity to 68 dB in conversation.  Baseline:  Goal status: INITIAL  4.  Pt will complete PROM.  Baseline:  Goal status: INITIAL   LONG TERM GOALS: Target date: 06/16/2022    Pt will report increased vocal quality on PROM.  Baseline:  Goal status: INITIAL  2.  Pt will complete vocal exercises mod I.  Baseline:  Goal status: INITIAL  3.  Pt will increase vocal intensity to 70+ db in conversation mod I. Baseline:  Goal status: INITIAL  4.  Pt will increase pitch range to [facility] with mod I use of strategies.  Baseline:  Goal status: INITIAL    ASSESSMENT:  CLINICAL IMPRESSION: Patient is a 66 y.o. male who was seen today for hoarseness and reduce vocal intensity. Pt supports change in vocal intensity and hoarseness following lung biopsy in April 2023. Pt participated in procure on 04/03/22 to medialize left paralyzed vocal fold, which was successful and resulted in increased vocal intensity. Pt continues to report acute changes in vocal function from baseline and vocal fatigue over 1-2 hours. Pt demonstrated produced an average of 64 db in volume and mainly low pitch with little variation. Pt does  demonstrate hoarse vocal quality and reduced vocal intensity would result in challenging communication in certain environment.  Pt would benefit in skilled ST services to focus on vocal strength, vocal strategies and communication strategies.   OBJECTIVE IMPAIRMENTS: include voice disorder. These impairments are limiting patient from effectively communicating at home and in community. Factors affecting potential to achieve goals and functional outcome are medical prognosis and severity of impairments.. Patient will benefit from skilled SLP services to address above impairments and improve overall function.  REHAB POTENTIAL: Good  PLAN:  SLP FREQUENCY: 1x/week  SLP DURATION: 6 weeks  PLANNED INTERVENTIONS: Cueing hierachy, Internal/external aids, SLP instruction and feedback, and Compensatory strategies    Lilac Hoff  [place], [place] 05/05/2022, 2:35 PM

## 2022-05-07 ENCOUNTER — Ambulatory Visit (HOSPITAL_COMMUNITY)
Admission: RE | Admit: 2022-05-07 | Discharge: 2022-05-07 | Disposition: A | Discharge status: Home / Self Care | Payer: PPO | Source: Ambulatory Visit | Attending: Physician Assistant | Admitting: Physician Assistant

## 2022-05-07 ENCOUNTER — Inpatient Hospital Stay: Payer: PPO | Attending: Internal Medicine

## 2022-05-07 DIAGNOSIS — C3490 Malignant neoplasm of unspecified part of unspecified bronchus or lung: Secondary | ICD-10-CM | POA: Diagnosis not present

## 2022-05-07 DIAGNOSIS — R490 Dysphonia: Secondary | ICD-10-CM | POA: Insufficient documentation

## 2022-05-07 DIAGNOSIS — J439 Emphysema, unspecified: Secondary | ICD-10-CM | POA: Diagnosis not present

## 2022-05-07 DIAGNOSIS — Z923 Personal history of irradiation: Secondary | ICD-10-CM | POA: Insufficient documentation

## 2022-05-07 DIAGNOSIS — C3412 Malignant neoplasm of upper lobe, left bronchus or lung: Secondary | ICD-10-CM | POA: Insufficient documentation

## 2022-05-07 DIAGNOSIS — J432 Centrilobular emphysema: Secondary | ICD-10-CM | POA: Insufficient documentation

## 2022-05-07 LAB — CBC WITH DIFFERENTIAL (CANCER CENTER ONLY)
Abs Immature Granulocytes: 0.02 10*3/uL (ref 0.00–0.07)
Basophils Absolute: 0 10*3/uL (ref 0.0–0.1)
Basophils Relative: 1 %
Eosinophils Absolute: 0.1 10*3/uL (ref 0.0–0.5)
Eosinophils Relative: 2 %
HCT: 39.8 % (ref 39.0–52.0)
Hemoglobin: 14 g/dL (ref 13.0–17.0)
Immature Granulocytes: 0 %
Lymphocytes Relative: 14 %
Lymphs Abs: 0.6 10*3/uL — ABNORMAL LOW (ref 0.7–4.0)
MCH: 31.7 pg (ref 26.0–34.0)
MCHC: 35.2 g/dL (ref 30.0–36.0)
MCV: 90 fL (ref 80.0–100.0)
Monocytes Absolute: 0.5 10*3/uL (ref 0.1–1.0)
Monocytes Relative: 10 %
Neutro Abs: 3.3 10*3/uL (ref 1.7–7.7)
Neutrophils Relative %: 73 %
Platelet Count: 142 10*3/uL — ABNORMAL LOW (ref 150–400)
RBC: 4.42 MIL/uL (ref 4.22–5.81)
RDW: 12 % (ref 11.5–15.5)
WBC Count: 4.6 10*3/uL (ref 4.0–10.5)
nRBC: 0 % (ref 0.0–0.2)

## 2022-05-07 LAB — CMP (CANCER CENTER ONLY)
ALT: 10 U/L (ref 0–44)
AST: 14 U/L — ABNORMAL LOW (ref 15–41)
Albumin: 3.9 g/dL (ref 3.5–5.0)
Alkaline Phosphatase: 74 U/L (ref 38–126)
Anion gap: 6 (ref 5–15)
BUN: 9 mg/dL (ref 8–23)
CO2: 26 mmol/L (ref 22–32)
Calcium: 9.2 mg/dL (ref 8.9–10.3)
Chloride: 99 mmol/L (ref 98–111)
Creatinine: 0.86 mg/dL (ref 0.61–1.24)
GFR, Estimated: >60 mL/min (ref >60)
Glucose, Bld: 91 mg/dL (ref 70–99)
Potassium: 4.1 mmol/L (ref 3.5–5.1)
Sodium: 131 mmol/L — ABNORMAL LOW (ref 135–145)
Total Bilirubin: 0.7 mg/dL (ref 0.3–1.2)
Total Protein: 6.5 g/dL (ref 6.5–8.1)

## 2022-05-07 LAB — MAGNESIUM: Magnesium: 1.9 mg/dL (ref 1.7–2.4)

## 2022-05-07 MED ORDER — SODIUM CHLORIDE (PF) 0.9 % IJ SOLN
INTRAMUSCULAR | Status: AC
  Filled 2022-05-07: qty 50

## 2022-05-07 MED ORDER — IOHEXOL 300 MG/ML  SOLN
75.0000 mL | Freq: Once | INTRAMUSCULAR | Status: AC | PRN
  Administered 2022-05-07: 75 mL via INTRAVENOUS

## 2022-05-11 ENCOUNTER — Encounter: Payer: Self-pay | Admitting: Internal Medicine

## 2022-05-11 ENCOUNTER — Inpatient Hospital Stay (HOSPITAL_BASED_OUTPATIENT_CLINIC_OR_DEPARTMENT_OTHER): Payer: PPO | Admitting: Internal Medicine

## 2022-05-11 VITALS — BP 117/67 | HR 66 | Temp 99.8°F | Resp 17 | Wt 151.1 lb

## 2022-05-11 DIAGNOSIS — C3412 Malignant neoplasm of upper lobe, left bronchus or lung: Secondary | ICD-10-CM

## 2022-05-11 DIAGNOSIS — J432 Centrilobular emphysema: Secondary | ICD-10-CM | POA: Diagnosis not present

## 2022-05-11 DIAGNOSIS — R490 Dysphonia: Secondary | ICD-10-CM | POA: Diagnosis not present

## 2022-05-11 DIAGNOSIS — Z923 Personal history of irradiation: Secondary | ICD-10-CM | POA: Diagnosis not present

## 2022-05-11 NOTE — Progress Notes (Signed)
[place] [phone]   Fax:(336) [phone]  OFFICE PROGRESS NOTE  [name], MD [address] [place] [address]  DIAGNOSIS: Limited stage (T1c, N2, M0) small cell lung cancer presented with left upper lobe lung mass in addition to left hilar and mediastinal lymphadenopathy diagnosed in April 2023.  PRIOR THERAPY:  1) Systemic chemotherapy with cisplatin 75 Mg/M2 on day 1 and 2 etoposide 100 Mg/M2 on days 1, 2 and 3 every 3 weeks.  First cycle 11/10/2021.  This is concurrent with radiotherapy.  Status post 4 cycles. 2) prophylactic cranial irradiation under the care of Dr. Sondra Come completed on 03/13/2022.  CURRENT THERAPY: Observation  INTERVAL HISTORY: Jonathan Castro 66 y.o. male returns to the clinic today for 40-month follow-up visit.  The patient is feeling fine today with no concerning complaints except for fatigue and lack of taste.  He had prophylactic cranial irradiation that completed on March 13, 2022.  He did not feel well after the prophylactic cranial irradiation.  He continues to have some hoarseness of his voice and he is followed by ENT.  He denied having any current chest pain, shortness of breath, cough or hemoptysis.  He denied having any fever or chills.  He has no nausea, vomiting, diarrhea or constipation.  He has no headache or visual changes.  He has no current weight loss or night sweats.  He is here today for evaluation with repeat CT scan of the chest for restaging of his disease.  MEDICAL HISTORY: Past Medical History:  Diagnosis Date   Arthritis    Cancer ([place])    COPD (chronic obstructive pulmonary disease) ([place])    History of radiation therapy    Left Lung- 11/25/21-01/06/22- Dr. Gery Pray   History of radiation therapy    Brain- 02/26/22-03/13/22- Dr. Gery Pray    ALLERGIES:  is allergic to penicillamine, penicillins, and arava [leflunomide].  MEDICATIONS:  Current Outpatient Medications   Medication Sig Dispense Refill   acetaminophen (TYLENOL) 650 MG CR tablet Take 1,300 mg by mouth every 8 (eight) hours as needed for pain. (Patient not taking: Reported on 04/16/2022)     albuterol (VENTOLIN HFA) 108 (90 Base) MCG/ACT inhaler Inhale 2 puffs into the lungs every 6 (six) hours as needed for wheezing or shortness of breath. (Patient not taking: Reported on 04/16/2022)     chlorproMAZINE (THORAZINE) 25 MG tablet TAKE ONE TABLET (25MG  TOTAL) BY MOUTH THREE TIMES DAILY (Patient not taking: Reported on 03/30/2022) 30 tablet 1   doxycycline (VIBRA-TABS) 100 MG tablet Take 1 tablet (100 mg total) by mouth 2 (two) times daily. (Patient not taking: Reported on 03/30/2022) 20 tablet 0   fexofenadine-pseudoephedrine (ALLEGRA-D) 60-120 MG 12 hr tablet Take 1 tablet by mouth 2 (two) times daily as needed (allergies). (Patient not taking: Reported on 84/16/6063)     folic acid (FOLVITE) 1 MG tablet Take 1 mg by mouth daily.     lidocaine (XYLOCAINE) 2 % solution Use as directed 15 mLs in the mouth or throat as needed for mouth pain. Dllute per instructions given by nursing, swallow 20 min prior to meals and bedtime (Patient not taking: Reported on 03/30/2022) 100 mL 2   magnesium oxide (MAG-OX) 400 (240 Mg) MG tablet Take 1 tablet (400 mg total) by mouth daily. (Patient not taking: Reported on 02/09/2022) 20 tablet 0   methotrexate 50 MG/2ML injection Inject 15 mg into the vein every Wednesday. (Patient not taking: Reported on 04/16/2022)  mometasone (ELOCON) 0.1 % cream Apply 1 Application topically in the morning and at bedtime. (Patient not taking: Reported on 04/16/2022)     oxyCODONE-acetaminophen (PERCOCET/ROXICET) 5-325 MG tablet Take 1 tablet by mouth every 8 (eight) hours as needed for severe pain. (Patient not taking: Reported on 02/09/2022) 30 tablet 0   predniSONE (DELTASONE) 5 MG tablet Take 5 mg by mouth daily as needed (arthritis flare). (Patient not taking: Reported on 04/16/2022)      prochlorperazine (COMPAZINE) 10 MG tablet Take 1 tablet (10 mg total) by mouth every 6 (six) hours as needed for nausea or vomiting. (Patient not taking: Reported on 04/16/2022) 30 tablet 0   No current facility-administered medications for this visit.    SURGICAL HISTORY:  Past Surgical History:  Procedure Laterality Date   MEDIASTINOSCOPY N/A 10/20/2021   Procedure: MEDIASTINOSCOPY;  Surgeon: Melrose Nakayama, MD;  Location: [place];  Service: Thoracic;  Laterality: N/A;   MICROLARYNGOSCOPY W/VOCAL CORD INJECTION Left 04/03/2022   Procedure: MICROLARYNGOSCOPY WITH MEDIALIZATION OF LEFT VOCAL FOLD WITH PROLARYN;  Surgeon: Jason Coop, DO;  Location: [place];  Service: ENT;  Laterality: Left;   ROTATOR CUFF REPAIR Left    VIDEO BRONCHOSCOPY WITH ENDOBRONCHIAL ULTRASOUND N/A 10/20/2021   Procedure: VIDEO BRONCHOSCOPY WITH ENDOBRONCHIAL ULTRASOUND WITH BIOPSIES;  Surgeon: Melrose Nakayama, MD;  Location: [place];  Service: Thoracic;  Laterality: N/A;    REVIEW OF SYSTEMS:  A comprehensive review of systems was negative except for: Constitutional: positive for fatigue Ears, nose, mouth, throat, and face: positive for hoarseness   PHYSICAL EXAMINATION: General appearance: alert, cooperative, fatigued, and no distress Head: Normocephalic, without obvious abnormality, atraumatic Neck: no adenopathy, no JVD, supple, symmetrical, trachea midline, and thyroid not enlarged, symmetric, no tenderness/mass/nodules Lymph nodes: Cervical, supraclavicular, and axillary nodes normal. Resp: clear to auscultation bilaterally Back: symmetric, no curvature. ROM normal. No CVA tenderness. Cardio: regular rate and rhythm, S1, S2 normal, no murmur, click, rub or gallop GI: soft, non-tender; bowel sounds normal; no masses,  no organomegaly Extremities: extremities normal, atraumatic, no cyanosis or edema  ECOG PERFORMANCE STATUS: 1 - Symptomatic but completely ambulatory  Blood pressure 117/67, pulse  66, temperature 99.8 F (37.7 C), temperature source Oral, resp. rate 17, weight 151 lb 2 oz (68.5 kg), SpO2 98 %.  LABORATORY DATA: Lab Results  Component Value Date   WBC 4.6 05/07/2022   HGB 14.0 05/07/2022   HCT 39.8 05/07/2022   MCV 90.0 05/07/2022   PLT 142 (L) 05/07/2022      Chemistry      Component Value Date/Time   NA 131 (L) 05/07/2022 1058   K 4.1 05/07/2022 1058   CL 99 05/07/2022 1058   CO2 26 05/07/2022 1058   BUN 9 05/07/2022 1058   CREATININE 0.86 05/07/2022 1058      Component Value Date/Time   CALCIUM 9.2 05/07/2022 1058   ALKPHOS 74 05/07/2022 1058   AST 14 (L) 05/07/2022 1058   ALT 10 05/07/2022 1058   BILITOT 0.7 05/07/2022 1058       RADIOGRAPHIC STUDIES: CT Chest W Contrast  Result Date: 05/10/2022 CLINICAL DATA:  Small-cell lung cancer. Restaging. * Tracking Code: BO * EXAM: CT CHEST WITH CONTRAST TECHNIQUE: Multidetector CT imaging of the chest was performed during intravenous contrast administration. RADIATION DOSE REDUCTION: This exam was performed according to the departmental dose-optimization program which includes automated exposure control, adjustment of the mA and/or kV according to patient size and/or use of iterative reconstruction technique. CONTRAST:  40mL OMNIPAQUE IOHEXOL 300 MG/ML  SOLN COMPARISON:  01/29/2022 FINDINGS: Cardiovascular: The heart size is normal. No substantial pericardial effusion. Coronary artery calcification is evident. Mild atherosclerotic calcification is noted in the wall of the thoracic aorta. Mediastinum/Nodes: No mediastinal lymphadenopathy. There is no hilar lymphadenopathy. The esophagus has normal imaging features. There is no axillary lymphadenopathy. Lungs/Pleura: Centrilobular and paraseptal emphysema evident. Bullous changes noted in the upper lungs bilaterally, right greater than left. No suspicious pulmonary nodule or mass. No focal airspace consolidation. No pleural effusion. calcified granuloma again  noted in the lingula. Upper Abdomen: Small exophytic cyst noted upper pole left kidney, stable. No followup imaging is recommended. Musculoskeletal: No worrisome lytic or sclerotic osseous abnormality. IMPRESSION: 1. Stable exam. No new or progressive findings to suggest recurrent or metastatic disease in the chest. 2. Aortic Atherosclerosis (ICD10-I70.0) and Emphysema (ICD10-J43.9). Electronically Signed   By: Misty Stanley M.D.   On: 05/10/2022 07:44    ASSESSMENT AND PLAN: This is a very pleasant 66 years old white male recently diagnosed with limited stage (T1c, N2, M0) small cell lung cancer presented with left upper lobe lung mass in addition to left hilar and mediastinal lymphadenopathy diagnosed in April 2023. The patient underwent systemic chemotherapy with cisplatin 75 Mg/M2 on day 1 and oh etoposide 100 Mg/M2 on days 1, 2 and 3 every 3 weeks.  Status post 4 cycles.  This was concurrent with radiotherapy followed by prophylactic cranial irradiation. The patient is feeling fine today with no concerning complaints except for fatigue and lack of taste as well as hoarseness of his voice secondary to his most recent treatment with radiation. He had repeat CT scan of the chest performed recently.  I personally and independently reviewed the scan and discussed the result with the patient today. His scan showed no concerning findings for disease recurrence or metastasis. I recommended for the patient to continue on observation with repeat CT scan of the chest in 3 months. The patient was advised to call immediately if he has any other concerning symptoms in the interval. The patient voices understanding of current disease status and treatment options and is in agreement with the current care plan.  All questions were answered. The patient knows to call the clinic with any problems, questions or concerns. We can certainly see the patient much sooner if necessary.  The total time spent in the  appointment was 20 minutes.  Disclaimer: This note was dictated with voice recognition software. Similar sounding words can inadvertently be transcribed and may not be corrected upon review.

## 2022-05-13 ENCOUNTER — Ambulatory Visit: Payer: PPO

## 2022-05-13 DIAGNOSIS — R490 Dysphonia: Secondary | ICD-10-CM | POA: Diagnosis not present

## 2022-05-13 NOTE — Patient Instructions (Addendum)
ABDOMINAL BREATHING (5-10 minutes 5 times a day)  Shoulders down - this is a cue to relax Place your hand on your abdomen - this helps you focus on easy abdominal breath support - the best and most relaxed way to breathe Breathe in through your nose and fill your belly with air, watching your hand move outward Breathe out through your mouth and watch your belly move in. An audible "sh"  may help  Think of your belly as a balloon, when you fill with air (inhale), the balloon gets bigger. As the air goes out (exhale), the balloon deflates.  If you are having difficulty coordinating this, lay on your back with a plastic cup on your belly and repeat the above steps, watching you belly move up with inhalation and down with exhalations  Practice breathing in and out in front of a mirror, watching your belly Breathe in for a count of 5 and breathe out for a count of 5  Now as you breathe out, get a picture of relaxing in your mind Feel the constant in-out of your breathing with your belly Picture the tension in your throat and chest evaporate like steam, melting away and FEEL it do so Picture your throat opening up so wide that a grapefruit or softball could fit through your throat.  Semi-occluded vocal tract exercises (SOVTE)  These allow your vocal folds to vibrate without excess tension and promotes high placement of the voice  Use SOVTE as a warm up before prolonged speaking and vocal exercises  Watch Vocal Straw Exercises with Lolita Cram on YouTube: [email or web address]  Perform these exercises 2-3 times a day (2-3 minutes at time; 3-5 reps per exercise)  Blow air through straw and into water   Hum air through straw and into water  Pitch Glides (low to high; high to low) through straw and into water  Accents (siren sound)  As always, use good belly breathing while completing SOVTE

## 2022-05-13 NOTE — Therapy (Signed)
OUTPATIENT SPEECH LANGUAGE PATHOLOGY MOTOR SPEECH TREATMENT   Patient Name: Jonathan Castro MRN: [id number] DOB:05/31/1956, 66 y.o., male Today's Date: 05/13/2022  PCP: [name] REFERRING PROVIDER: Ebbie Latus   End of Session - 05/13/22 1015     Visit Number 2    Number of Visits 6    Date for SLP Re-Evaluation 06/16/22    Authorization Type [employer]    SLP Start Time 2831    SLP Stop Time  1102    SLP Time Calculation (min) 47 min    Activity Tolerance Patient tolerated treatment well              Past Medical History:  Diagnosis Date   Arthritis    Cancer ([place])    COPD (chronic obstructive pulmonary disease) ([place])    History of radiation therapy    Left Lung- 11/25/21-01/06/22- Dr. Gery Pray   History of radiation therapy    Brain- 02/26/22-03/13/22- Dr. Gery Pray   Past Surgical History:  Procedure Laterality Date   MEDIASTINOSCOPY N/A 10/20/2021   Procedure: MEDIASTINOSCOPY;  Surgeon: Melrose Nakayama, MD;  Location: [facility] OR;  Service: Thoracic;  Laterality: N/A;   MICROLARYNGOSCOPY W/VOCAL CORD INJECTION Left 04/03/2022   Procedure: MICROLARYNGOSCOPY WITH MEDIALIZATION OF LEFT VOCAL FOLD WITH PROLARYN;  Surgeon: Jason Coop, DO;  Location: [place];  Service: ENT;  Laterality: Left;   ROTATOR CUFF REPAIR Left    VIDEO BRONCHOSCOPY WITH ENDOBRONCHIAL ULTRASOUND N/A 10/20/2021   Procedure: VIDEO BRONCHOSCOPY WITH ENDOBRONCHIAL ULTRASOUND WITH BIOPSIES;  Surgeon: Melrose Nakayama, MD;  Location: [place];  Service: Thoracic;  Laterality: N/A;   Patient Active Problem List   Diagnosis Date Noted   Hoarseness of voice 04/03/2022   Primary small cell carcinoma of upper lobe of left lung ([place]) 10/30/2021   Encounter for antineoplastic chemotherapy 10/30/2021   Lung nodule 10/27/2021   DDD (degenerative disc disease), cervical 09/03/2021   Gastroesophageal reflux disease 09/03/2021   Hyperlipidemia 09/03/2021   Primary  osteoarthritis 09/03/2021   Rheumatoid arthritis ([place]) 09/03/2021   Periapical abscess of tooth with fistula 09/22/2016   Maxillary sinusitis, chronic 08/11/2016    Onset date: 4/23  REFERRING DIAG: hoarseness of voice and reduced vocal intensity.  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 04/03/22 Microlaryngoscopy with medialization of left vocal fold with prolaryn.   PAIN: Are you having pain? No  LIVING ENVIRONMENT: Lives with: lives alone Lives in: House/apartment  PLOF: Independent  PATIENT GOALS: " to get my voice back"  OBJECTIVE:   TODAY'S TREATMENT:  05-13-22: Completed V-RQOL with score of 22, including a lot of difficulty speaking loudly, occasionally feeling anxious/frustrated with his voice, sometimes avoiding going out socially because of his voice, and intermittently having to repeat himself. With further discussion, pt identified frequent vocal fatigue (i.e., tapping people to get attention during card night). Pt attempting to increase hydration (currently ~3 water bottles per day) following evaluation. Intermittently cued water sips as throat clear alternative during session. Educated and trained abdominal breathing to optimize breath support, with occasional min A required. Trialed semi-occluded vocal tract exercises (SOVT) and flow phonation to address muscle tension dysphonia and hoarseness, with rare benefit this session. Patient reported fatigue prior to completion of session, in which SLP recommended HEP with primary focus on abdominal breathing and SOVT to target  reduced breath support, decrease muscle tension, and optimize overall endurance.   05-05-22: SLP administered vocal assessment and created plan with pt.   PATIENT EDUCATION: Education details: see above Person educated: Patient Education method: Education officer, environmental, Verbal cues, and Handouts Education comprehension: verbalized understanding, returned demonstration, and needs further education  HOME EXERCISE PROGRAM: See above  GOALS: Goals reviewed with patient? Yes  SHORT TERM GOALS: Target date: 05/26/2022    Pt will complete HEP x1 a daily weekly Mod I.  Baseline: Goal status: IN PROGRESS  2.  Pt will complete vocal exercises to increase pitch with supervision A verbal cues. Baseline:  Goal status: IN PROGRESS  3.  Pt will complete vocal exercises to increase vocal intensity to 68 dB in conversation.  Baseline:  Goal status: IN PROGRESS  4.  Pt will complete PROM.  Baseline: V-RQOL=22 Goal status: MET   LONG TERM GOALS: Target date: 06/16/2022    Pt will report increased vocal quality on PROM.  Baseline:  Goal status: IN PROGRESS  2.  Pt will complete vocal exercises mod I.  Baseline:  Goal status: IN PROGRESS  3.  Pt will increase vocal intensity to 70+ db in conversation mod I. Baseline:  Goal status: IN PROGRESS  4.  Pt will increase pitch range to [facility] with mod I use of strategies.  Baseline:  Goal status: IN PROGRESS    ASSESSMENT:  CLINICAL IMPRESSION: Patient is a 66 y.o. male who was seen today for hoarseness and reduce vocal intensity. Pt supports change in vocal intensity and hoarseness following lung biopsy in April 2023. Pt participated in procure on 04/03/22 to medialize left paralyzed vocal fold, which was successful and resulted in increased vocal intensity. Pt continues to report acute changes in vocal function from baseline and vocal fatigue over 1-2 hours. Pt does demonstrate hoarse vocal quality and reduced vocal intensity would  result in challenging communication in certain environment. Initiated education and training of vocal exercises with HEP provided. Pt would benefit in skilled ST services to focus on vocal strength, vocal strategies and communication strategies.   OBJECTIVE IMPAIRMENTS: include voice disorder. These impairments are limiting patient from effectively communicating at home and in community. Factors affecting potential to achieve goals and functional outcome are medical prognosis and severity of impairments.. Patient will benefit from skilled SLP services to address above impairments and improve overall function.  REHAB POTENTIAL: Good  PLAN:  SLP FREQUENCY: 1x/week  SLP DURATION: 6 weeks  PLANNED INTERVENTIONS: Cueing hierachy, Internal/external aids, SLP instruction and feedback, and Compensatory strategies    Marzetta Board, CCC-SLP 05/13/2022, 11:49 AM

## 2022-05-15 ENCOUNTER — Ambulatory Visit (HOSPITAL_COMMUNITY)
Admission: RE | Admit: 2022-05-15 | Discharge: 2022-05-15 | Disposition: A | Discharge status: Home / Self Care | Payer: PPO | Source: Ambulatory Visit | Attending: Internal Medicine | Admitting: Internal Medicine

## 2022-05-15 ENCOUNTER — Other Ambulatory Visit (HOSPITAL_COMMUNITY): Payer: Self-pay | Admitting: Internal Medicine

## 2022-05-15 DIAGNOSIS — H539 Unspecified visual disturbance: Secondary | ICD-10-CM | POA: Insufficient documentation

## 2022-05-15 DIAGNOSIS — Z923 Personal history of irradiation: Secondary | ICD-10-CM | POA: Insufficient documentation

## 2022-05-15 DIAGNOSIS — R519 Headache, unspecified: Secondary | ICD-10-CM | POA: Insufficient documentation

## 2022-05-15 DIAGNOSIS — Z85118 Personal history of other malignant neoplasm of bronchus and lung: Secondary | ICD-10-CM | POA: Diagnosis not present

## 2022-05-15 DIAGNOSIS — R51 Headache with orthostatic component, not elsewhere classified: Secondary | ICD-10-CM | POA: Diagnosis not present

## 2022-05-15 DIAGNOSIS — Z510 Encounter for antineoplastic radiation therapy: Secondary | ICD-10-CM | POA: Diagnosis present

## 2022-05-15 DIAGNOSIS — C3490 Malignant neoplasm of unspecified part of unspecified bronchus or lung: Secondary | ICD-10-CM | POA: Diagnosis not present

## 2022-05-18 NOTE — Therapy (Unsigned)
OUTPATIENT SPEECH LANGUAGE PATHOLOGY MOTOR SPEECH TREATMENT   Patient Name: Jonathan Castro MRN: [id number] DOB:[birth year], 66 y.o., male Today's Date: 05/18/2022  PCP: [name] REFERRING PROVIDER: Ebbie Latus      Past Medical History:  Diagnosis Date   Arthritis    Cancer [facility])    COPD (chronic obstructive pulmonary disease) ([place])    History of radiation therapy    Left Lung- 11/25/21-01/06/22- Dr. Gery Pray   History of radiation therapy    Brain- 02/26/22-03/13/22- Dr. Gery Pray   Past Surgical History:  Procedure Laterality Date   MEDIASTINOSCOPY N/A 10/20/2021   Procedure: MEDIASTINOSCOPY;  Surgeon: Melrose Nakayama, MD;  Location: [place];  Service: Thoracic;  Laterality: N/A;   MICROLARYNGOSCOPY W/VOCAL CORD INJECTION Left 04/03/2022   Procedure: MICROLARYNGOSCOPY WITH MEDIALIZATION OF LEFT VOCAL FOLD WITH PROLARYN;  Surgeon: Jason Coop, DO;  Location: [place];  Service: ENT;  Laterality: Left;   ROTATOR CUFF REPAIR Left    VIDEO BRONCHOSCOPY WITH ENDOBRONCHIAL ULTRASOUND N/A 10/20/2021   Procedure: VIDEO BRONCHOSCOPY WITH ENDOBRONCHIAL ULTRASOUND WITH BIOPSIES;  Surgeon: Melrose Nakayama, MD;  Location: [place];  Service: Thoracic;  Laterality: N/A;   Patient Active Problem List   Diagnosis Date Noted   Hoarseness of voice 04/03/2022   Primary small cell carcinoma of upper lobe of left lung ([place]) 10/30/2021   Encounter for antineoplastic chemotherapy 10/30/2021   Lung nodule 10/27/2021   DDD (degenerative disc disease), cervical 09/03/2021   Gastroesophageal reflux disease 09/03/2021   Hyperlipidemia 09/03/2021   Primary osteoarthritis 09/03/2021   Rheumatoid arthritis ([place]) 09/03/2021   Periapical abscess of tooth with fistula 09/22/2016   Maxillary sinusitis, chronic 08/11/2016    Onset date: 4/23  REFERRING DIAG: hoarseness of voice and reduced vocal intensity.  THERAPY DIAG: No diagnosis found.  Rationale for Evaluation and  Treatment: Rehabilitation  SUBJECTIVE:   SUBJECTIVE STATEMENT: ***  PAIN: Are you having pain? No   OBJECTIVE:   TODAY'S TREATMENT:                                                                                                                                         05-19-22: ***  05-13-22: Completed V-RQOL with score of 22, including a lot of difficulty speaking loudly, occasionally feeling anxious/frustrated with his voice, sometimes avoiding going out socially because of his voice, and intermittently having to repeat himself. With further discussion, pt identified frequent vocal fatigue (i.e., tapping people to get attention during card night). Pt attempting to increase hydration (currently ~3 water bottles per day) following evaluation. Intermittently cued water sips as throat clear alternative during session. Educated and trained abdominal breathing to optimize breath support, with occasional min A required. Trialed semi-occluded vocal tract exercises (SOVT) and flow phonation to address muscle tension dysphonia and hoarseness, with rare benefit this session. Patient reported fatigue prior to completion of session, in which SLP recommended  HEP with primary focus on abdominal breathing and SOVT to target reduced breath support, decrease muscle tension, and optimize overall endurance.   05-05-22: SLP administered vocal assessment and created plan with pt.   PATIENT EDUCATION: Education details: see above Person educated: Patient Education method: Education officer, environmental, Verbal cues, and Handouts Education comprehension: verbalized understanding, returned demonstration, and needs further education  HOME EXERCISE PROGRAM: See above  GOALS: Goals reviewed with patient? Yes  SHORT TERM GOALS: Target date: 05/26/2022    Pt will complete HEP x1 a daily weekly Mod I.  Baseline: Goal status: IN PROGRESS  2.  Pt will complete vocal exercises to increase pitch with supervision A  verbal cues. Baseline:  Goal status: IN PROGRESS  3.  Pt will complete vocal exercises to increase vocal intensity to 68 dB in conversation.  Baseline:  Goal status: IN PROGRESS  4.  Pt will complete PROM.  Baseline: V-RQOL=22 Goal status: MET   LONG TERM GOALS: Target date: 06/16/2022    Pt will report increased vocal quality on PROM.  Baseline:  Goal status: IN PROGRESS  2.  Pt will complete vocal exercises mod I.  Baseline:  Goal status: IN PROGRESS  3.  Pt will increase vocal intensity to 70+ db in conversation mod I. Baseline:  Goal status: IN PROGRESS  4.  Pt will increase pitch range to [facility] with mod I use of strategies.  Baseline:  Goal status: IN PROGRESS    ASSESSMENT:  CLINICAL IMPRESSION: Patient is a 65 y.o. male who was seen today for hoarseness and reduce vocal intensity. Pt supports change in vocal intensity and hoarseness following lung biopsy in April 2023. Pt participated in procure on 04/03/22 to medialize left paralyzed vocal fold, which was successful and resulted in increased vocal intensity. Pt continues to report acute changes in vocal function from baseline and vocal fatigue over 1-2 hours. Pt does demonstrate hoarse vocal quality and reduced vocal intensity would result in challenging communication in certain environment. Initiated education and training of vocal exercises with HEP provided. Pt would benefit in skilled ST services to focus on vocal strength, vocal strategies and communication strategies.   OBJECTIVE IMPAIRMENTS: include voice disorder. These impairments are limiting patient from effectively communicating at home and in community. Factors affecting potential to achieve goals and functional outcome are medical prognosis and severity of impairments.. Patient will benefit from skilled SLP services to address above impairments and improve overall function.  REHAB POTENTIAL: Good  PLAN:  SLP FREQUENCY: 1x/week  SLP DURATION: 6  weeks  PLANNED INTERVENTIONS: Cueing hierachy, Internal/external aids, SLP instruction and feedback, and Compensatory strategies    Su Monks, CCC-SLP 05/18/2022, 1:37 PM

## 2022-05-19 ENCOUNTER — Ambulatory Visit: Payer: PPO

## 2022-05-19 DIAGNOSIS — R490 Dysphonia: Secondary | ICD-10-CM | POA: Diagnosis not present

## 2022-05-19 NOTE — Patient Instructions (Addendum)
Remember, breathing is essential to produce a good, strong voice! Keep a log of how the exercises are going   Homework: Practice "belly breathing" for a couple minutes at a time. Look down at your hand while you do it   Practice straw exercises (through straw and into water) Blow for 5-10 seconds  Hum for 5-10 seconds (keep lips sealed around straw)  Pitch glides or accents [employer]") x3-5x (strong but not forceful)  If you feel the need to clear your throat, take a sip of water instead! Stay hydrated throughout the day! Small sips    Consider consulting your GI doctor given your complaints. Tell these complaints to your doctor next visit in case they need to refer you

## 2022-05-19 NOTE — Therapy (Signed)
OUTPATIENT SPEECH LANGUAGE PATHOLOGY TREATMENT   Patient Name: Jonathan Castro MRN: [id number] DOB:Dec 06, 1955, 66 y.o., male Today's Date: 05/19/2022  PCP: [name] REFERRING PROVIDER: Ebbie Latus   End of Session - 05/19/22 0949     Visit Number 3    Number of Visits 6    Date for SLP Re-Evaluation 06/16/22    Authorization Type [employer]    SLP Start Time 1001    SLP Stop Time  9643    SLP Time Calculation (min) 46 min    Activity Tolerance Patient tolerated treatment well              Past Medical History:  Diagnosis Date   Arthritis    Cancer ([place])    COPD (chronic obstructive pulmonary disease) ([place])    History of radiation therapy    Left Lung- 11/25/21-01/06/22- Dr. Gery Pray   History of radiation therapy    Brain- 02/26/22-03/13/22- Dr. Gery Pray   Past Surgical History:  Procedure Laterality Date   MEDIASTINOSCOPY N/A 10/20/2021   Procedure: MEDIASTINOSCOPY;  Surgeon: Melrose Nakayama, MD;  Location: [facility] OR;  Service: Thoracic;  Laterality: N/A;   MICROLARYNGOSCOPY W/VOCAL CORD INJECTION Left 04/03/2022   Procedure: MICROLARYNGOSCOPY WITH MEDIALIZATION OF LEFT VOCAL FOLD WITH PROLARYN;  Surgeon: Jason Coop, DO;  Location: [place];  Service: ENT;  Laterality: Left;   ROTATOR CUFF REPAIR Left    VIDEO BRONCHOSCOPY WITH ENDOBRONCHIAL ULTRASOUND N/A 10/20/2021   Procedure: VIDEO BRONCHOSCOPY WITH ENDOBRONCHIAL ULTRASOUND WITH BIOPSIES;  Surgeon: Melrose Nakayama, MD;  Location: [place];  Service: Thoracic;  Laterality: N/A;   Patient Active Problem List   Diagnosis Date Noted   Hoarseness of voice 04/03/2022   Primary small cell carcinoma of upper lobe of left lung ([place]) 10/30/2021   Encounter for antineoplastic chemotherapy 10/30/2021   Lung nodule 10/27/2021   DDD (degenerative disc disease), cervical 09/03/2021   Gastroesophageal reflux disease 09/03/2021   Hyperlipidemia 09/03/2021   Primary osteoarthritis 09/03/2021    Rheumatoid arthritis ([place]) 09/03/2021   Periapical abscess of tooth with fistula 09/22/2016   Maxillary sinusitis, chronic 08/11/2016    Onset date: 4/23  REFERRING DIAG: hoarseness of voice and reduced vocal intensity  THERAPY DIAG: Hoarseness of voice  Rationale for Evaluation and Treatment: Rehabilitation  SUBJECTIVE:   SUBJECTIVE STATEMENT: "I got sick so I couldn't do the exercises" Pt accompanied by: self  PERTINENT HISTORY: 4/23 bronchoscopy resulting in hoarseness and noted left vocal fold paresis post examination. 04/03/22 Microlaryngoscopy with medialization of left vocal fold with prolaryn.   04-16-22: Jonathan Castro is a 66 y.o. male with history of small cell carcinoma of the lung, status post completion of chemoradiation therapy presenting for follow-up of hoarseness and known left vocal fold paresis, now status post microlaryngoscopy with Prolaryn injection on 04/03/2022. Nasolaryngoscopy performed today demonstrates improved medialization of the left true vocal fold, with no persistent glottic gap. There is mild supraglottic compression noted with sustained phonation, consistent with muscle tension dysphonia, as well as moderate interarytenoid edema and erythema consistent with laryngopharyngeal reflux.   PAIN: Are you having pain? No  PATIENT GOALS: " to get my voice back"  OBJECTIVE:   TODAY'S TREATMENT:  05-19-22: Limited completion of recommended exercises reported (abdominal breathing and SOVTE) due to acute sickness (N/V, HA). Attempts to complete exercises impacted by fatigue and sore throat. Pt requested review of targeted exercises with education, demonstration, and handout provided today. Pt easily fatigued during sustained phonation exercises, requiring usual breaks. Mod cues required to optimize targeted intensity. Pt c/o  regurgitation while eating and oral holding with liquids. Recommended small sips to reduce oral holding, which was effective during session. Suggested pt f/u with MD re: possible esophogeal dysfunction. Of note, it seems pt not taking medication for acid reflux, which may be beneficial per ENT finding of LPR. Will assess next session effectiveness of targeted exercises to reduce muscle tension dysphonia.   05-13-22: Completed V-RQOL with score of 22, including a lot of difficulty speaking loudly, occasionally feeling anxious/frustrated with his voice, sometimes avoiding going out socially because of his voice, and intermittently having to repeat himself. With further discussion, pt identified frequent vocal fatigue (i.e., tapping people to get attention during card night). Pt attempting to increase hydration (currently ~3 water bottles per day) following evaluation. Intermittently cued water sips as throat clear alternative during session. Educated and trained abdominal breathing to optimize breath support, with occasional min A required. Trialed semi-occluded vocal tract exercises (SOVT) and flow phonation to address muscle tension dysphonia and hoarseness, with rare benefit this session. Patient reported fatigue prior to completion of session, in which SLP recommended HEP with primary focus on abdominal breathing and SOVT to target reduced breath support, decrease muscle tension, and optimize overall endurance.   05-05-22: SLP administered vocal assessment and created plan with pt.   PATIENT EDUCATION: Education details: see above Person educated: Patient Education method: Education officer, environmental, Verbal cues, and Handouts Education comprehension: verbalized understanding, returned demonstration, and needs further education  HOME EXERCISE PROGRAM: See above  GOALS: Goals reviewed with patient? Yes  SHORT TERM GOALS: Target date: 05/26/2022    Pt will complete HEP x1 a daily weekly Mod I.   Baseline: Goal status: IN PROGRESS  2.  Pt will complete vocal exercises to increase pitch with supervision A verbal cues. Baseline:  Goal status: IN PROGRESS  3.  Pt will complete vocal exercises to increase vocal intensity to 68 dB in conversation.  Baseline:  Goal status: IN PROGRESS  4.  Pt will complete PROM.  Baseline: V-RQOL=22 Goal status: MET   LONG TERM GOALS: Target date: 06/16/2022    Pt will report increased vocal quality on PROM.  Baseline:  Goal status: IN PROGRESS  2.  Pt will complete vocal exercises mod I.  Baseline:  Goal status: IN PROGRESS  3.  Pt will increase vocal intensity to 70+ db in conversation mod I. Baseline:  Goal status: IN PROGRESS  4.  Pt will increase pitch range to [facility] with mod I use of strategies.  Baseline:  Goal status: IN PROGRESS    ASSESSMENT:  CLINICAL IMPRESSION: Patient is a 66 y.o. male who was seen today for hoarseness and reduce vocal intensity. Pt supports change in vocal intensity and hoarseness following lung biopsy in April 2023. Pt participated in procure on 04/03/22 to medialize left paralyzed vocal fold, which was successful and resulted in increased vocal intensity. Pt continues to report acute changes in vocal function from baseline and vocal fatigue over 1-2 hours. Pt does demonstrate hoarse vocal quality and reduced vocal intensity would result in challenging communication in certain environment. Conducted ongoing education and training of vocal exercises with HEP provided. Completion of  HEP impacted by acute sickness, will continue to monitor success as repeat ENT visit may be warranted if vocal quality does not improve with recommended exercises. At this time, pt would continue to benefit in skilled ST services to focus on vocal strength, vocal strategies and communication strategies.   OBJECTIVE IMPAIRMENTS: include voice disorder. These impairments are limiting patient from effectively communicating at home and  in community. Factors affecting potential to achieve goals and functional outcome are medical prognosis and severity of impairments.. Patient will benefit from skilled SLP services to address above impairments and improve overall function.  REHAB POTENTIAL: Good  PLAN:  SLP FREQUENCY: 1x/week  SLP DURATION: 6 weeks  PLANNED INTERVENTIONS: Cueing hierachy, Internal/external aids, SLP instruction and feedback, and Compensatory strategies    Marzetta Board, CCC-SLP 05/19/2022, 11:01 AM

## 2022-05-25 NOTE — Therapy (Unsigned)
OUTPATIENT SPEECH LANGUAGE PATHOLOGY TREATMENT   Patient Name: Jonathan Castro MRN: [id number] DOB:19-Feb-1956, 66 y.o., male Today's Date: 05/26/2022  PCP: [name] REFERRING PROVIDER: Ebbie Latus   End of Session - 05/26/22 1020     Visit Number 4    Number of Visits 6    Date for SLP Re-Evaluation 06/16/22    Authorization Type [employer]    SLP Start Time 1017    SLP Stop Time  1055    SLP Time Calculation (min) 38 min    Activity Tolerance Patient tolerated treatment well               Past Medical History:  Diagnosis Date   Arthritis    Cancer ([place])    COPD (chronic obstructive pulmonary disease) ([place])    History of radiation therapy    Left Lung- 11/25/21-01/06/22- Dr. Gery Pray   History of radiation therapy    Brain- 02/26/22-03/13/22- Dr. Gery Pray   Past Surgical History:  Procedure Laterality Date   MEDIASTINOSCOPY N/A 10/20/2021   Procedure: MEDIASTINOSCOPY;  Surgeon: Melrose Nakayama, MD;  Location: [facility] OR;  Service: Thoracic;  Laterality: N/A;   MICROLARYNGOSCOPY W/VOCAL CORD INJECTION Left 04/03/2022   Procedure: MICROLARYNGOSCOPY WITH MEDIALIZATION OF LEFT VOCAL FOLD WITH PROLARYN;  Surgeon: Jason Coop, DO;  Location: [place];  Service: ENT;  Laterality: Left;   ROTATOR CUFF REPAIR Left    VIDEO BRONCHOSCOPY WITH ENDOBRONCHIAL ULTRASOUND N/A 10/20/2021   Procedure: VIDEO BRONCHOSCOPY WITH ENDOBRONCHIAL ULTRASOUND WITH BIOPSIES;  Surgeon: Melrose Nakayama, MD;  Location: [place];  Service: Thoracic;  Laterality: N/A;   Patient Active Problem List   Diagnosis Date Noted   Hoarseness of voice 04/03/2022   Primary small cell carcinoma of upper lobe of left lung ([place]) 10/30/2021   Encounter for antineoplastic chemotherapy 10/30/2021   Lung nodule 10/27/2021   DDD (degenerative disc disease), cervical 09/03/2021   Gastroesophageal reflux disease 09/03/2021   Hyperlipidemia 09/03/2021   Primary osteoarthritis  09/03/2021   Rheumatoid arthritis ([place]) 09/03/2021   Periapical abscess of tooth with fistula 09/22/2016   Maxillary sinusitis, chronic 08/11/2016    Onset date: 4/23  REFERRING DIAG: hoarseness of voice and reduced vocal intensity  THERAPY DIAG: Hoarseness of voice  Rationale for Evaluation and Treatment: Rehabilitation  SUBJECTIVE:   SUBJECTIVE STATEMENT: "I kept the log. It doesn't seem any better" Pt accompanied by: self  PERTINENT HISTORY: 4/23 bronchoscopy resulting in hoarseness and noted left vocal fold paresis post examination. 04/03/22 Microlaryngoscopy with medialization of left vocal fold with prolaryn.   04-16-22: Jonathan Castro is a 66 y.o. male with history of small cell carcinoma of the lung, status post completion of chemoradiation therapy presenting for follow-up of hoarseness and known left vocal fold paresis, now status post microlaryngoscopy with Prolaryn injection on 04/03/2022. Nasolaryngoscopy performed today demonstrates improved medialization of the left true vocal fold, with no persistent glottic gap. There is mild supraglottic compression noted with sustained phonation, consistent with muscle tension dysphonia, as well as moderate interarytenoid edema and erythema consistent with laryngopharyngeal reflux.   PAIN: Are you having pain? No  PATIENT GOALS: " to get my voice back"  OBJECTIVE:   TODAY'S TREATMENT:  05-25-22: Ongoing baseline hoarseness noted and reported. HEP completed for SOVT exercises and abdominal breathing. Pt kept log which revealed ongoing difficulty coordinating breath support and phonation with increased resistance (through straw and into water). SLP cued increased effort and projection to achieve more forward focused phonation for shorter duration, which mildly aided vocal clarity and intensity. Pt easily  fatigued due to reduced breath support requiring usual breaks. Trialed PhoRTE to assess whether exuberant voice was effective in combination with increased breath support, with no overt change in hoarseness exhibited. Minimal change in vocal quality exhibited with either trials of flow phonation and PhoRTE. Pt may benefit from repeat ENT visit to re-assess larynx given no overt change in vocal quality achieved at this time despite trials of varied techniques. Overall pt may be limited in part by reduced breath support and deconditioning.    05-19-22: Limited completion of recommended exercises reported (abdominal breathing and SOVTE) due to acute sickness (N/V, HA). Attempts to complete exercises impacted by fatigue and sore throat. Pt requested review of targeted exercises with education, demonstration, and handout provided today. Pt easily fatigued during sustained phonation exercises, requiring usual breaks. Mod cues required to optimize targeted intensity. Pt c/o regurgitation while eating and oral holding with liquids. Recommended small sips to reduce oral holding, which was effective during session. Suggested pt f/u with MD re: possible esophogeal dysfunction. Of note, it seems pt not taking medication for acid reflux, which may be beneficial per ENT finding of LPR. Will assess next session effectiveness of targeted exercises to reduce muscle tension dysphonia.   05-13-22: Completed V-RQOL with score of 22, including a lot of difficulty speaking loudly, occasionally feeling anxious/frustrated with his voice, sometimes avoiding going out socially because of his voice, and intermittently having to repeat himself. With further discussion, pt identified frequent vocal fatigue (i.e., tapping people to get attention during card night). Pt attempting to increase hydration (currently ~3 water bottles per day) following evaluation. Intermittently cued water sips as throat clear alternative during session. Educated  and trained abdominal breathing to optimize breath support, with occasional min A required. Trialed semi-occluded vocal tract exercises (SOVT) and flow phonation to address muscle tension dysphonia and hoarseness, with rare benefit this session. Patient reported fatigue prior to completion of session, in which SLP recommended HEP with primary focus on abdominal breathing and SOVT to target reduced breath support, decrease muscle tension, and optimize overall endurance.   05-05-22: SLP administered vocal assessment and created plan with pt.   PATIENT EDUCATION: Education details: see above Person educated: Patient Education method: Education officer, environmental, Verbal cues, and Handouts Education comprehension: verbalized understanding, returned demonstration, and needs further education  HOME EXERCISE PROGRAM: See above  GOALS: Goals reviewed with patient? Yes  SHORT TERM GOALS: Target date: 05/26/2022    Pt will complete HEP x1 a daily weekly Mod I.  Baseline: Goal status: MET  2.  Pt will complete vocal exercises to increase pitch with supervision A verbal cues. Baseline:  Goal status: NOT MET  3.  Pt will complete vocal exercises to increase vocal intensity to 68 dB in conversation.  Baseline:  Goal status: PARTIALLY MET  4.  Pt will complete PROM.  Baseline: V-RQOL=22 Goal status: MET   LONG TERM GOALS: Target date: 06/16/2022    Pt will report increased vocal quality on PROM.  Baseline:  Goal status: IN PROGRESS  2.  Pt will complete vocal exercises mod I.  Baseline:  Goal status: IN PROGRESS  3.  Pt will  increase vocal intensity to 70+ db in conversation mod I. Baseline:  Goal status: IN PROGRESS  4.  Pt will increase pitch range to [facility] with mod I use of strategies.  Baseline:  Goal status: IN PROGRESS    ASSESSMENT:  CLINICAL IMPRESSION: Patient is a 66 y.o. male who was seen today for hoarseness and reduce vocal intensity. Pt presents with ongoing hoarse  vocal quality and reduced vocal intensity despite trialed interventions. Overall performance appears impacted by reduced breath support, deconditioning, and possibly habituation of back focused phonation. l will continue to monitor success with HEP and trialed interventions but repeat ENT visit may be warranted if vocal quality does not improve with recommended exercises. At this time, pt would continue to benefit in skilled ST services to focus on vocal strength, vocal strategies and communication strategies.   OBJECTIVE IMPAIRMENTS: include voice disorder. These impairments are limiting patient from effectively communicating at home and in community. Factors affecting potential to achieve goals and functional outcome are medical prognosis and severity of impairments.. Patient will benefit from skilled SLP services to address above impairments and improve overall function.  REHAB POTENTIAL: Good  PLAN:  SLP FREQUENCY: 1x/week  SLP DURATION: 6 weeks  PLANNED INTERVENTIONS: Cueing hierachy, Internal/external aids, SLP instruction and feedback, and Compensatory strategies    Marzetta Board, CCC-SLP 05/26/2022, 11:28 AM

## 2022-05-26 ENCOUNTER — Ambulatory Visit: Payer: PPO

## 2022-05-26 DIAGNOSIS — R490 Dysphonia: Secondary | ICD-10-CM

## 2022-06-02 ENCOUNTER — Ambulatory Visit: Payer: PPO | Attending: Otolaryngology

## 2022-06-02 DIAGNOSIS — R490 Dysphonia: Secondary | ICD-10-CM | POA: Diagnosis not present

## 2022-06-02 NOTE — Therapy (Signed)
OUTPATIENT SPEECH LANGUAGE PATHOLOGY TREATMENT   Patient Name: Jonathan Castro MRN: [id number] DOB:1956-03-14, 66 y.o., male Today's Date: 06/02/2022  PCP: Jonathan Castro REFERRING PROVIDER: Ebbie Castro   End of Session - 06/02/22 1019     Visit Number 5    Number of Visits 6    Date for SLP Re-Evaluation 06/16/22    Authorization Type [employer]    SLP Start Time 1015    SLP Stop Time  1059    SLP Time Calculation (min) 44 min    Activity Tolerance Patient tolerated treatment well                Past Medical History:  Diagnosis Date   Arthritis    Cancer ([place])    COPD (chronic obstructive pulmonary disease) ([place])    History of radiation therapy    Left Lung- 11/25/21-01/06/22- Dr. Gery Pray   History of radiation therapy    Brain- 02/26/22-03/13/22- Dr. Gery Pray   Past Surgical History:  Procedure Laterality Date   MEDIASTINOSCOPY N/A 10/20/2021   Procedure: MEDIASTINOSCOPY;  Surgeon: Melrose Nakayama, MD;  Location: [facility] OR;  Service: Thoracic;  Laterality: N/A;   MICROLARYNGOSCOPY W/VOCAL CORD INJECTION Left 04/03/2022   Procedure: MICROLARYNGOSCOPY WITH MEDIALIZATION OF LEFT VOCAL FOLD WITH PROLARYN;  Surgeon: Jason Coop, DO;  Location: [place];  Service: ENT;  Laterality: Left;   ROTATOR CUFF REPAIR Left    VIDEO BRONCHOSCOPY WITH ENDOBRONCHIAL ULTRASOUND N/A 10/20/2021   Procedure: VIDEO BRONCHOSCOPY WITH ENDOBRONCHIAL ULTRASOUND WITH BIOPSIES;  Surgeon: Melrose Nakayama, MD;  Location: [place];  Service: Thoracic;  Laterality: N/A;   Patient Active Problem List   Diagnosis Date Noted   Hoarseness of voice 04/03/2022   Primary small cell carcinoma of upper lobe of left lung ([place]) 10/30/2021   Encounter for antineoplastic chemotherapy 10/30/2021   Lung nodule 10/27/2021   DDD (degenerative disc disease), cervical 09/03/2021   Gastroesophageal reflux disease 09/03/2021   Hyperlipidemia 09/03/2021   Primary osteoarthritis  09/03/2021   Rheumatoid arthritis ([place]) 09/03/2021   Periapical abscess of tooth with fistula 09/22/2016   Maxillary sinusitis, chronic 08/11/2016    Onset date: 4/23  REFERRING DIAG: hoarseness of voice and reduced vocal intensity  THERAPY DIAG: Hoarseness of voice  Rationale for Evaluation and Treatment: Rehabilitation  SUBJECTIVE:   SUBJECTIVE STATEMENT: "It's the same" Pt accompanied by: self  PERTINENT HISTORY: 4/23 bronchoscopy resulting in hoarseness and noted left vocal fold paresis post examination. 04/03/22 Microlaryngoscopy with medialization of left vocal fold with prolaryn.   04-16-22: Jonathan Castro is a 66 y.o. male with history of small cell carcinoma of the lung, status post completion of chemoradiation therapy presenting for follow-up of hoarseness and known left vocal fold paresis, now status post microlaryngoscopy with Prolaryn injection on 04/03/2022. Nasolaryngoscopy performed today demonstrates improved medialization of the left true vocal fold, with no persistent glottic gap. There is mild supraglottic compression noted with sustained phonation, consistent with muscle tension dysphonia, as well as moderate interarytenoid edema and erythema consistent with laryngopharyngeal reflux.   PAIN: Are you having pain? No  PATIENT GOALS: " to get my voice back"  OBJECTIVE:   TODAY'S TREATMENT:  06-02-22: Pt reportedly completing HEP without benefit. Subjectively pt presented with mildly increased vocal intensity compared to prior sessions. Targeted SOVT exercises and flow phonation today. Fatigue and reduced breath support noted after 1-2 repetitions. Usual rest breaks required d/t impaired breath support. SLP modified task to decrease duration to optimize vocal clarity and intensity, which was beneficial. With usual modeling and cues, pt able  to achieve forward focused phonation for flow phonation sounds/words. Pt able to confirm forward sensation and feedback of forward buzz on lips versus throat. While pt able to demonstrate some ability to improve breath support and optimize vocal performance, pt exhibited limited buy-in for targeted exercises and requested cancellation of remaining ST visits. Pt would like to discuss surgery with ENT prior to returning to ST.   05-25-22: Ongoing baseline hoarseness noted and reported. HEP completed for SOVT exercises and abdominal breathing. Pt kept log which revealed ongoing difficulty coordinating breath support and phonation with increased resistance (through straw and into water). SLP cued increased effort and projection to achieve more forward focused phonation for shorter duration, which mildly aided vocal clarity and intensity. Pt easily fatigued due to reduced breath support requiring usual breaks. Trialed PhoRTE to assess whether exuberant voice was effective in combination with increased breath support, with no overt change in hoarseness exhibited. Minimal change in vocal quality exhibited with either trials of flow phonation and PhoRTE. Pt may benefit from repeat ENT visit to re-assess larynx given no overt change in vocal quality achieved at this time despite trials of varied techniques. Overall pt may be limited in part by reduced breath support and deconditioning.    05-19-22: Limited completion of recommended exercises reported (abdominal breathing and SOVTE) due to acute sickness (N/V, HA). Attempts to complete exercises impacted by fatigue and sore throat. Pt requested review of targeted exercises with education, demonstration, and handout provided today. Pt easily fatigued during sustained phonation exercises, requiring usual breaks. Mod cues required to optimize targeted intensity. Pt c/o regurgitation while eating and oral holding with liquids. Recommended small sips to reduce oral holding,  which was effective during session. Suggested pt f/u with MD re: possible esophogeal dysfunction. Of note, it seems pt not taking medication for acid reflux, which may be beneficial per ENT finding of LPR. Will assess next session effectiveness of targeted exercises to reduce muscle tension dysphonia.   05-13-22: Completed V-RQOL with score of 22, including a lot of difficulty speaking loudly, occasionally feeling anxious/frustrated with his voice, sometimes avoiding going out socially because of his voice, and intermittently having to repeat himself. With further discussion, pt identified frequent vocal fatigue (i.e., tapping people to get attention during card night). Pt attempting to increase hydration (currently ~3 water bottles per day) following evaluation. Intermittently cued water sips as throat clear alternative during session. Educated and trained abdominal breathing to optimize breath support, with occasional min A required. Trialed semi-occluded vocal tract exercises (SOVT) and flow phonation to address muscle tension dysphonia and hoarseness, with rare benefit this session. Patient reported fatigue prior to completion of session, in which SLP recommended HEP with primary focus on abdominal breathing and SOVT to target reduced breath support, decrease muscle tension, and optimize overall endurance.    PATIENT EDUCATION: Education details: see above Person educated: Patient Education method: Education officer, environmental, Verbal cues, and Handouts Education comprehension: verbalized understanding, returned demonstration, and needs further education  HOME EXERCISE PROGRAM: See above  GOALS: Goals reviewed with patient? Yes  SHORT TERM GOALS: Target date: 05/26/2022  Pt will complete HEP x1 a daily weekly Mod I.  Baseline: Goal status: MET  2.  Pt will complete vocal exercises to increase pitch with supervision A verbal cues. Baseline:  Goal status: NOT MET  3.  Pt will complete  vocal exercises to increase vocal intensity to 68 dB in conversation.  Baseline:  Goal status: PARTIALLY MET  4.  Pt will complete PROM.  Baseline: V-RQOL=22 Goal status: MET   LONG TERM GOALS: Target date: 06/16/2022    Pt will report increased vocal quality on PROM.  Baseline:  Goal status: IN PROGRESS  2.  Pt will complete vocal exercises mod I.  Baseline:  Goal status: IN PROGRESS  3.  Pt will increase vocal intensity to 70+ db in conversation mod I. Baseline:  Goal status: IN PROGRESS  4.  Pt will increase pitch range to [facility] with mod I use of strategies.  Baseline:  Goal status: IN PROGRESS    ASSESSMENT:  CLINICAL IMPRESSION: Patient is a 66 y.o. male who was seen today for hoarseness. Pt presents with ongoing hoarse vocal quality and mildly improved vocal intensity. Limited buy in exhibited for trialed interventions given minimal immediate benefit. Overall performance appears impacted by reduced breath support, deconditioning, and possibly habituation of back focused phonation. Pt would like to cancel remaining ST visits and repeat ENT visit. Recommended continuing HEP (SOVTE and flow phonation exercises) while awaiting ENT appt.   OBJECTIVE IMPAIRMENTS: include voice disorder. These impairments are limiting patient from effectively communicating at home and in community. Factors affecting potential to achieve goals and functional outcome are medical prognosis and severity of impairments.. Patient will benefit from skilled SLP services to address above impairments and improve overall function.  REHAB POTENTIAL: Good  PLAN:  SLP FREQUENCY: 1x/week  SLP DURATION: 6 weeks  PLANNED INTERVENTIONS: Cueing hierachy, Internal/external aids, SLP instruction and feedback, and Compensatory strategies    Marzetta Board, CCC-SLP 06/02/2022, 11:13 AM

## 2022-06-02 NOTE — Patient Instructions (Addendum)
While you are waiting for your ENT appointment, focus on your breath support and straw exercises  Good, deep breath before Keep lips sealed around straw  Send your voice forward. Strong, not strained   Exercises (3x/day) Blow x3-5 Hum x3-5 "Walker Shadow woo" x3-5 Low to high x3-5  High to low x3-5  See "Flow Phonation" for your other exericses

## 2022-06-09 ENCOUNTER — Ambulatory Visit: Payer: PPO

## 2022-06-16 DIAGNOSIS — H90A22 Sensorineural hearing loss, unilateral, left ear, with restricted hearing on the contralateral side: Secondary | ICD-10-CM | POA: Diagnosis not present

## 2022-06-16 DIAGNOSIS — C3490 Malignant neoplasm of unspecified part of unspecified bronchus or lung: Secondary | ICD-10-CM | POA: Diagnosis not present

## 2022-06-16 DIAGNOSIS — R490 Dysphonia: Secondary | ICD-10-CM | POA: Diagnosis not present

## 2022-06-16 DIAGNOSIS — H90A31 Mixed conductive and sensorineural hearing loss, unilateral, right ear with restricted hearing on the contralateral side: Secondary | ICD-10-CM | POA: Diagnosis not present

## 2022-06-16 DIAGNOSIS — J3801 Paralysis of vocal cords and larynx, unilateral: Secondary | ICD-10-CM | POA: Diagnosis not present

## 2022-06-16 DIAGNOSIS — H6521 Chronic serous otitis media, right ear: Secondary | ICD-10-CM | POA: Diagnosis not present

## 2022-06-16 DIAGNOSIS — K219 Gastro-esophageal reflux disease without esophagitis: Secondary | ICD-10-CM | POA: Diagnosis not present

## 2022-06-18 DIAGNOSIS — M1991 Primary osteoarthritis, unspecified site: Secondary | ICD-10-CM | POA: Diagnosis not present

## 2022-06-18 DIAGNOSIS — C3490 Malignant neoplasm of unspecified part of unspecified bronchus or lung: Secondary | ICD-10-CM | POA: Diagnosis not present

## 2022-06-18 DIAGNOSIS — Z6822 Body mass index (BMI) 22.0-22.9, adult: Secondary | ICD-10-CM | POA: Diagnosis not present

## 2022-06-18 DIAGNOSIS — M0579 Rheumatoid arthritis with rheumatoid factor of multiple sites without organ or systems involvement: Secondary | ICD-10-CM | POA: Diagnosis not present

## 2022-06-18 DIAGNOSIS — M25511 Pain in right shoulder: Secondary | ICD-10-CM | POA: Diagnosis not present

## 2022-06-18 DIAGNOSIS — M5030 Other cervical disc degeneration, unspecified cervical region: Secondary | ICD-10-CM | POA: Diagnosis not present

## 2022-06-18 DIAGNOSIS — K219 Gastro-esophageal reflux disease without esophagitis: Secondary | ICD-10-CM | POA: Diagnosis not present

## 2022-07-13 ENCOUNTER — Other Ambulatory Visit: Payer: Self-pay | Admitting: Medical Oncology

## 2022-07-13 DIAGNOSIS — C3412 Malignant neoplasm of upper lobe, left bronchus or lung: Secondary | ICD-10-CM

## 2022-07-16 NOTE — Progress Notes (Incomplete)
Jonathan Castro is here today for follow up post radiation to the brain.    They completed their radiation on: 03/13/2022  Does the patient complain of any of the following: Headache: *** Visual Changes: *** Hearing Changes: *** Nausea: *** Vomiting: *** Balance or coordination issues: *** Memory issues: ***  Is the patient currently on a Decadron regimen? : ***  Additional comments if applicable:

## 2022-07-17 ENCOUNTER — Telehealth: Payer: Self-pay | Admitting: *Deleted

## 2022-07-17 NOTE — Telephone Encounter (Signed)
CALLED PATIENT TO ASK ABOUT RESCHEDULING FU FROM 07-23-22 DUE TO DR. KINARD BEING IN THE OR, SPOKE WITH PATIENT AND HE AGREED TO COME ON 07-30-22 @ 9:30 AM

## 2022-07-23 ENCOUNTER — Ambulatory Visit
Admission: RE | Admit: 2022-07-23 | Discharge: 2022-07-23 | Disposition: A | Discharge status: Home / Self Care | Payer: PPO | Source: Ambulatory Visit | Attending: Radiation Oncology | Admitting: Radiation Oncology

## 2022-07-29 NOTE — Progress Notes (Signed)
Radiation Oncology         [phone] ________________________________  Name: Jonathan Castro MRN: [id number]  Date: 07/30/2022  DOB: [birth year]  Follow-Up Visit Note  CC: [name], MD  Curt Bears, MD    ICD-10-CM   1. 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   Large left upper lobe pulmonary nodule: small cell carcinoma with lymph node metastases   Cancer Staging  Primary small cell carcinoma of upper lobe of left lung (HCC) Staging form: Lung, AJCC 8th Edition - Clinical: Stage IIIA (cT1c, cN2, cM0) - Signed by Curt Bears, MD on 10/30/2021  Interval Since Last Radiation: 4 months and 17 days   Prophylactic cranial radiation  Intent: Curative Radiation Treatment Dates: 02/26/2022 through 03/13/2022 Site Technique Total Dose (Gy) Dose per Fx (Gy) Completed Fx Beam Energies  Brain: Brain IMRT 25/25 2.5 10/10 6X    Intent: Curative Radiation Treatment Dates: 11/25/2021 through 01/06/2022 Site Technique Total Dose (Gy) Dose per Fx (Gy) Completed Fx Beam Energies  Lung, Left: Lung_L 3D 60/60 2 30/30 6X, 10X    Narrative:  The patient returns today for routine follow-up. He was last seen here for follow-up on 04/16/22. Since his last visit, the patient presented for a restaging chest CT with contrast on 05/07/22 which showed no new or progressive findings to suggest recurrent or metastatic disease in the chest.   The patient most recently followed up with Dr. Julien Nordmann on 05/11/22. During which time, the patient denied any concerns other than fatigue and lack of taste. He also reported having some hoarseness of his voice, for which he follows with ENT for. Dr. Julien Nordmann reviewed his most recent CT with the patient, and recommends for the patient to  continue on observation with a repeat CT scan of the chest in February.   He also followed up with Dr. Fredric Dine at [facility] on 06/16/22. During which time, the patient endorsed ongoing issues with muffled hearing, especially in the right ear, despite completing a course of prednisone and Flonase this past October. The patient also expressed interest in a more permanent procedure to address his vocal/speech difficulties, given that he has not benefited from speech therapy services. Dr. Fredric Dine would like to hold off on surgery for a few months while the effect of his Prolaryn injection wears off (given at the time of his microlaryngoscopy procedure on 04/03/22). For his hearing loss in the right ear, Dr. Fredric Dine performed a right myringotomy with tympanostomy tube placement during this visit. The patient will return to Dr. Fredric Dine later this month for a repeat audiogram.          Of note: The patient presented to his PCP in November with complaints of visual disturbance, nausea, and headache x 3 days. This prompted a CT of the head on 05/15/22 which showed no acute intracranial abnormality. Imaging did demonstrate complete opacification of the right mastoid air cells and middle ear cavity, which appears to have increased since his prior brain MRI.        Today the patient complains of generalized weakness since losing his taste He states his appetite has been good, but things don't taste good to him anymore. He has been drinking Fairlife protein shakes to try to maintain  his weight. He states he has lost approximately 10 lbs since starting treatment. He denies dizziness unless he stands up or moves positions to quickly. He also has noticed a mild ache in the left occipital region of his head. Pain does not radiate. He only notices this ache intermittently. It has been present for about one month. Patient states his breathing has been good. He endorses some shortness of breath with exertion along  with a productive cough which are unchanged from his baseline. He denies any hemoptysis or chest pain.  Allergies:  is allergic to penicillamine, penicillins, and arava [leflunomide].  Meds: Current Outpatient Medications  Medication Sig Dispense Refill   acetaminophen (TYLENOL) 650 MG CR tablet Take 1,300 mg by mouth every 8 (eight) hours as needed for pain. (Patient not taking: Reported on 04/16/2022)     albuterol (VENTOLIN HFA) 108 (90 Base) MCG/ACT inhaler Inhale 2 puffs into the lungs every 6 (six) hours as needed for wheezing or shortness of breath. (Patient not taking: Reported on 04/16/2022)     chlorproMAZINE (THORAZINE) 25 MG tablet TAKE ONE TABLET (25MG  TOTAL) BY MOUTH THREE TIMES DAILY (Patient not taking: Reported on 03/30/2022) 30 tablet 1   doxycycline (VIBRA-TABS) 100 MG tablet Take 1 tablet (100 mg total) by mouth 2 (two) times daily. (Patient not taking: Reported on 03/30/2022) 20 tablet 0   fexofenadine-pseudoephedrine (ALLEGRA-D) 60-120 MG 12 hr tablet Take 1 tablet by mouth 2 (two) times daily as needed (allergies). (Patient not taking: Reported on 85/63/1497)     folic acid (FOLVITE) 1 MG tablet Take 1 mg by mouth daily. (Patient not taking: Reported on 07/30/2022)     lidocaine (XYLOCAINE) 2 % solution Use as directed 15 mLs in the mouth or throat as needed for mouth pain. [place] per instructions given by nursing, swallow 20 min prior to meals and bedtime (Patient not taking: Reported on 03/30/2022) 100 mL 2   magnesium oxide (MAG-OX) 400 (240 Mg) MG tablet Take 1 tablet (400 mg total) by mouth daily. (Patient not taking: Reported on 02/09/2022) 20 tablet 0   methotrexate 50 MG/2ML injection Inject 15 mg into the vein every Wednesday. (Patient not taking: Reported on 04/16/2022)     mometasone (ELOCON) 0.1 % cream Apply 1 Application topically in the morning and at bedtime. (Patient not taking: Reported on 04/16/2022)     oxyCODONE-acetaminophen (PERCOCET/ROXICET) 5-325 MG tablet  Take 1 tablet by mouth every 8 (eight) hours as needed for severe pain. (Patient not taking: Reported on 02/09/2022) 30 tablet 0   predniSONE (DELTASONE) 5 MG tablet Take 5 mg by mouth daily as needed (arthritis flare). (Patient not taking: Reported on 04/16/2022)     prochlorperazine (COMPAZINE) 10 MG tablet Take 1 tablet (10 mg total) by mouth every 6 (six) hours as needed for nausea or vomiting. (Patient not taking: Reported on 04/16/2022) 30 tablet 0   No current facility-administered medications for this encounter.    Physical Findings: The patient is in no acute distress. Patient is alert and oriented.  height is 5\' 7"  (1.702 m) and weight is 146 lb 4 oz (66.3 kg). His temporal temperature is 97.1 F (36.2 C) (abnormal). His blood pressure is 87/60 (abnormal) and his pulse is 85. His respiration is 18 and oxygen saturation is 100%. .   Lungs are clear to auscultation bilaterally. EOMs intact. Oropharynx is clear, no evidence of thrush. Ear canals clear, tympanostomy tube in the right ear intact. Heart has regular rate and rhythm. Lungs are  clear to auscultation in all fields. No palpable cervical, supraclavicular, or axillary adenopathy. Abdomen soft, non-tender, normal bowel sounds. Symmetric strength and muscle tone throughout. Cranial nerves II through XII are grossly intact. No obvious focalities. Speech is fluent. Coordination is intact.    Lab Findings: Lab Results  Component Value Date   WBC 4.6 05/07/2022   HGB 14.0 05/07/2022   HCT 39.8 05/07/2022   MCV 90.0 05/07/2022   PLT 142 (L) 05/07/2022    Radiographic Findings: No results found.  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   Large left upper lobe pulmonary nodule: small cell carcinoma with lymph node metastases  The patient is recovering from the effects of radiation.  No evidence of disease recurrence on clinical exam today. We discussed the risks and  benefits of routine follow up MRI scan of the brain. Patient would like to defer further imaging at this time.   Patient's weakness and hypotension is likely contributed from malnutrition. Consult with nutrition would likely benefit patient to learn new ways to increase caloric intake.   Plan:  CT scan with and follow up scheduled with Dr. Julien Nordmann for 08/13/22. Will follow up with patient in office in 3 months.   Referral to nutrition made today.   20 minutes of total time was spent for this patient encounter, including preparation, face-to-face counseling with the patient and coordination of care, physical exam, and documentation of the encounter. ____________________________________   Leona Singleton, PA   Blair Promise, PhD, MD  This document serves as a record of services personally performed by Gery Pray, MD. It was created on his behalf by Roney Mans, a trained medical scribe. The creation of this record is based on the scribe's personal observations and the provider's statements to them. This document has been checked and approved by the attending provider.

## 2022-07-30 ENCOUNTER — Encounter: Payer: Self-pay | Admitting: Radiation Oncology

## 2022-07-30 ENCOUNTER — Telehealth: Payer: Self-pay | Admitting: *Deleted

## 2022-07-30 ENCOUNTER — Ambulatory Visit
Admission: RE | Admit: 2022-07-30 | Discharge: 2022-07-30 | Disposition: A | Discharge status: Home / Self Care | Payer: PPO | Source: Ambulatory Visit | Attending: Radiation Oncology | Admitting: Radiation Oncology

## 2022-07-30 VITALS — BP 87/60 | HR 85 | Temp 97.1°F | Resp 18 | Ht 67.0 in | Wt 146.2 lb

## 2022-07-30 DIAGNOSIS — Z923 Personal history of irradiation: Secondary | ICD-10-CM | POA: Diagnosis not present

## 2022-07-30 DIAGNOSIS — C3412 Malignant neoplasm of upper lobe, left bronchus or lung: Secondary | ICD-10-CM

## 2022-07-30 DIAGNOSIS — E46 Unspecified protein-calorie malnutrition: Secondary | ICD-10-CM | POA: Diagnosis not present

## 2022-07-30 DIAGNOSIS — Z87891 Personal history of nicotine dependence: Secondary | ICD-10-CM | POA: Diagnosis not present

## 2022-07-30 NOTE — Progress Notes (Signed)
Jonathan Castro is here today for follow up post radiation to the brain and left lung  They completed their radiation on: 03/13/2022 (brain) and 01/06/2022 (left lung)  Does the patient complain of any of the following: Headache:  Yes , to back of left head.  Visual Changes: No Hearing Changes: Yes, patient recently had tube placed to right ear to help with increased drainage.  Nausea: No Vomiting: No Balance or coordination issues: No  Memory issues: Yes  Does the patient complain of any of the following: Pain:No Shortness of breath w/wo exertion: Yes, mostly on exertion.  Cough: Yes, productive.  Hemoptysis: No Pain with swallowing: patient states, " Its not pain but its hard for me to swallow. I have to chew my food for a while before I can swallow.  Swallowing/choking concerns: No Appetite:  Good, but patient reports food does not taste good.  Energy Level:  Low Post radiation skin Changes: NO Is the patient currently on a Decadron regimen? : Completed course.   Additional comments if applicable: Noted patient to have low blood pressure. 87/60, 85/53 reports feeling dizziness at times with position changes.    BP (!) 87/60 (BP Location: Left Arm, Patient Position: Sitting)   Pulse 85   Temp (!) 97.1 F (36.2 C) (Temporal)   Resp 18   Ht 5\' 7"  (1.702 m)   Wt 146 lb 4 oz (66.3 kg)   SpO2 100%   BMI 22.91 kg/m

## 2022-07-30 NOTE — Telephone Encounter (Signed)
CALLED PATIENT TO INFORM OF NUTRITION APPT. ON 08-13-22 @ 1:45 PM WITH BARBARA NEFF, SPOKE WITH PATIENT AND HE IS AWARE OF THIS APPT.

## 2022-07-31 DIAGNOSIS — Z79899 Other long term (current) drug therapy: Secondary | ICD-10-CM | POA: Diagnosis not present

## 2022-07-31 DIAGNOSIS — M5090 Cervical disc disorder, unspecified, unspecified cervical region: Secondary | ICD-10-CM | POA: Diagnosis not present

## 2022-07-31 DIAGNOSIS — M069 Rheumatoid arthritis, unspecified: Secondary | ICD-10-CM | POA: Diagnosis not present

## 2022-07-31 DIAGNOSIS — J439 Emphysema, unspecified: Secondary | ICD-10-CM | POA: Diagnosis not present

## 2022-07-31 DIAGNOSIS — Z125 Encounter for screening for malignant neoplasm of prostate: Secondary | ICD-10-CM | POA: Diagnosis not present

## 2022-07-31 DIAGNOSIS — C3490 Malignant neoplasm of unspecified part of unspecified bronchus or lung: Secondary | ICD-10-CM | POA: Diagnosis not present

## 2022-07-31 DIAGNOSIS — I700 Atherosclerosis of aorta: Secondary | ICD-10-CM | POA: Diagnosis not present

## 2022-08-07 DIAGNOSIS — J439 Emphysema, unspecified: Secondary | ICD-10-CM | POA: Diagnosis not present

## 2022-08-07 DIAGNOSIS — I2510 Atherosclerotic heart disease of native coronary artery without angina pectoris: Secondary | ICD-10-CM | POA: Diagnosis not present

## 2022-08-07 DIAGNOSIS — M069 Rheumatoid arthritis, unspecified: Secondary | ICD-10-CM | POA: Diagnosis not present

## 2022-08-07 DIAGNOSIS — C3490 Malignant neoplasm of unspecified part of unspecified bronchus or lung: Secondary | ICD-10-CM | POA: Diagnosis not present

## 2022-08-07 DIAGNOSIS — I700 Atherosclerosis of aorta: Secondary | ICD-10-CM | POA: Diagnosis not present

## 2022-08-10 ENCOUNTER — Encounter: Payer: PPO | Admitting: Nutrition

## 2022-08-11 ENCOUNTER — Inpatient Hospital Stay: Payer: PPO | Attending: Internal Medicine

## 2022-08-11 ENCOUNTER — Ambulatory Visit (HOSPITAL_COMMUNITY)
Admission: RE | Admit: 2022-08-11 | Discharge: 2022-08-11 | Disposition: A | Discharge status: Home / Self Care | Payer: PPO | Source: Ambulatory Visit | Attending: Internal Medicine | Admitting: Internal Medicine

## 2022-08-11 DIAGNOSIS — R630 Anorexia: Secondary | ICD-10-CM | POA: Insufficient documentation

## 2022-08-11 DIAGNOSIS — C3412 Malignant neoplasm of upper lobe, left bronchus or lung: Secondary | ICD-10-CM | POA: Insufficient documentation

## 2022-08-11 DIAGNOSIS — Z923 Personal history of irradiation: Secondary | ICD-10-CM | POA: Insufficient documentation

## 2022-08-11 DIAGNOSIS — R490 Dysphonia: Secondary | ICD-10-CM | POA: Insufficient documentation

## 2022-08-11 DIAGNOSIS — J439 Emphysema, unspecified: Secondary | ICD-10-CM | POA: Diagnosis not present

## 2022-08-11 DIAGNOSIS — C3490 Malignant neoplasm of unspecified part of unspecified bronchus or lung: Secondary | ICD-10-CM | POA: Diagnosis not present

## 2022-08-11 LAB — CBC WITH DIFFERENTIAL (CANCER CENTER ONLY)
Abs Immature Granulocytes: 0.01 10*3/uL (ref 0.00–0.07)
Basophils Absolute: 0 10*3/uL (ref 0.0–0.1)
Basophils Relative: 1 %
Eosinophils Absolute: 0.1 10*3/uL (ref 0.0–0.5)
Eosinophils Relative: 2 %
HCT: 37.4 % — ABNORMAL LOW (ref 39.0–52.0)
Hemoglobin: 13.3 g/dL (ref 13.0–17.0)
Immature Granulocytes: 0 %
Lymphocytes Relative: 17 %
Lymphs Abs: 0.8 10*3/uL (ref 0.7–4.0)
MCH: 32.1 pg (ref 26.0–34.0)
MCHC: 35.6 g/dL (ref 30.0–36.0)
MCV: 90.3 fL (ref 80.0–100.0)
Monocytes Absolute: 0.5 10*3/uL (ref 0.1–1.0)
Monocytes Relative: 11 %
Neutro Abs: 3.2 10*3/uL (ref 1.7–7.7)
Neutrophils Relative %: 69 %
Platelet Count: 185 10*3/uL (ref 150–400)
RBC: 4.14 MIL/uL — ABNORMAL LOW (ref 4.22–5.81)
RDW: 13.8 % (ref 11.5–15.5)
WBC Count: 4.7 10*3/uL (ref 4.0–10.5)
nRBC: 0 % (ref 0.0–0.2)

## 2022-08-11 LAB — CMP (CANCER CENTER ONLY)
ALT: 8 U/L (ref 0–44)
AST: 14 U/L — ABNORMAL LOW (ref 15–41)
Albumin: 3.9 g/dL (ref 3.5–5.0)
Alkaline Phosphatase: 64 U/L (ref 38–126)
Anion gap: 6 (ref 5–15)
BUN: 9 mg/dL (ref 8–23)
CO2: 27 mmol/L (ref 22–32)
Calcium: 9.4 mg/dL (ref 8.9–10.3)
Chloride: 101 mmol/L (ref 98–111)
Creatinine: 0.88 mg/dL (ref 0.61–1.24)
GFR, Estimated: >60 mL/min (ref >60)
Glucose, Bld: 95 mg/dL (ref 70–99)
Potassium: 4.3 mmol/L (ref 3.5–5.1)
Sodium: 134 mmol/L — ABNORMAL LOW (ref 135–145)
Total Bilirubin: 0.8 mg/dL (ref 0.3–1.2)
Total Protein: 6 g/dL — ABNORMAL LOW (ref 6.5–8.1)

## 2022-08-11 MED ORDER — IOHEXOL 300 MG/ML  SOLN
75.0000 mL | Freq: Once | INTRAMUSCULAR | Status: AC | PRN
  Administered 2022-08-11: 75 mL via INTRAVENOUS

## 2022-08-11 NOTE — Progress Notes (Unsigned)
[facility] OFFICE PROGRESS NOTE  [name], MD [address] [place] [address]  DIAGNOSIS: Limited stage (T1c, N2, M0) small cell lung cancer presented with left upper lobe lung mass in addition to left hilar and mediastinal lymphadenopathy diagnosed in April 2023.   PRIOR THERAPY: 1) Systemic chemotherapy with cisplatin 75 Mg/M2 on day 1 and 2 etoposide 100 Mg/M2 on days 1, 2 and 3 every 3 weeks.  First cycle 11/10/2021.  This is concurrent with radiotherapy.  Status post 4 cycles. 2) prophylactic cranial irradiation under the care of Dr. Sondra Come completed on 03/13/2022.  CURRENT THERAPY: Observation   INTERVAL HISTORY: Jonathan Castro 67 y.o. male returns to the clinic today for a 58-month follow-up visit.  The patient last saw Dr. Julien Nordmann on 05/11/2022.  The patient is being followed for his history of small cell lung cancer.  The patient was diagnosed in April 2023 and underwent 4 cycles of chemo and radiation.  He also completed PCI under the care of Dr. Sondra Come in September 2023.  He has been having some ongoing issues since that time with hoarseness for which he saw ENT through [facility]. He sees Dr. Fredric Dine is going to evaluate him next week for possible surgery.  He also has been having some hearing difficulties since that time. He had ET tubes placed and he states his hearing is 50% better.    Otherwise he denies any fever, chills, or night sweats. He has been having some ongoing issues related to decreased appetite and weight loss. He met with a member of the nutritionist team today.  His breathing is "ok" . He sometimes needs to use his inhaler. He denies cough unless he eats. He denies sensation of aspiration. He denies significant heartburn and denies odynophagia or dysphagia. He denies significant shortness of breath, he just reports he tires easy. He denies chest pain or hemoptysis. He denies any nausea, vomiting, diarrhea, or constipation.  Headaches or  vision changes. He did mention to Dr. Sondra Come that sometimes he has discomfort in the occipital region in the AM. Dr. Sondra Come offered repeat brain MRI which the patient declined since his symptoms are mild. The patient recently had a restaging CT scan performed.  The patient is here today for evaluation to review his scan results.  Saw nutrition today.    MEDICAL HISTORY: Past Medical History:  Diagnosis Date   Arthritis    Cancer ([place])    COPD (chronic obstructive pulmonary disease) ([place])    History of radiation therapy    Left Lung- 11/25/21-01/06/22- Dr. Gery Pray   History of radiation therapy    Brain- 02/26/22-03/13/22- Dr. Gery Pray    ALLERGIES:  is allergic to penicillamine, penicillins, and arava [leflunomide].  MEDICATIONS:  Current Outpatient Medications  Medication Sig Dispense Refill   acetaminophen (TYLENOL) 650 MG CR tablet Take 1,300 mg by mouth every 8 (eight) hours as needed for pain. (Patient not taking: Reported on 04/16/2022)     albuterol (VENTOLIN HFA) 108 (90 Base) MCG/ACT inhaler Inhale 2 puffs into the lungs every 6 (six) hours as needed for wheezing or shortness of breath. (Patient not taking: Reported on 04/16/2022)     chlorproMAZINE (THORAZINE) 25 MG tablet TAKE ONE TABLET (25MG  TOTAL) BY MOUTH THREE TIMES DAILY (Patient not taking: Reported on 03/30/2022) 30 tablet 1   doxycycline (VIBRA-TABS) 100 MG tablet Take 1 tablet (100 mg total) by mouth 2 (two) times daily. (Patient not taking: Reported on 03/30/2022) 20  tablet 0   fexofenadine-pseudoephedrine (ALLEGRA-D) 60-120 MG 12 hr tablet Take 1 tablet by mouth 2 (two) times daily as needed (allergies). (Patient not taking: Reported on 31/51/7616)     folic acid (FOLVITE) 1 MG tablet Take 1 mg by mouth daily. (Patient not taking: Reported on 07/30/2022)     lidocaine (XYLOCAINE) 2 % solution Use as directed 15 mLs in the mouth or throat as needed for mouth pain. Dllute per instructions given by nursing,  swallow 20 min prior to meals and bedtime (Patient not taking: Reported on 03/30/2022) 100 mL 2   magnesium oxide (MAG-OX) 400 (240 Mg) MG tablet Take 1 tablet (400 mg total) by mouth daily. (Patient not taking: Reported on 02/09/2022) 20 tablet 0   methotrexate 50 MG/2ML injection Inject 15 mg into the vein every Wednesday. (Patient not taking: Reported on 04/16/2022)     mometasone (ELOCON) 0.1 % cream Apply 1 Application topically in the morning and at bedtime. (Patient not taking: Reported on 04/16/2022)     oxyCODONE-acetaminophen (PERCOCET/ROXICET) 5-325 MG tablet Take 1 tablet by mouth every 8 (eight) hours as needed for severe pain. (Patient not taking: Reported on 02/09/2022) 30 tablet 0   predniSONE (DELTASONE) 5 MG tablet Take 5 mg by mouth daily as needed (arthritis flare). (Patient not taking: Reported on 04/16/2022)     prochlorperazine (COMPAZINE) 10 MG tablet Take 1 tablet (10 mg total) by mouth every 6 (six) hours as needed for nausea or vomiting. (Patient not taking: Reported on 04/16/2022) 30 tablet 0   No current facility-administered medications for this visit.    SURGICAL HISTORY:  Past Surgical History:  Procedure Laterality Date   MEDIASTINOSCOPY N/A 10/20/2021   Procedure: MEDIASTINOSCOPY;  Surgeon: Melrose Nakayama, MD;  Location: [facility];  Service: Thoracic;  Laterality: N/A;   MICROLARYNGOSCOPY W/VOCAL CORD INJECTION Left 04/03/2022   Procedure: MICROLARYNGOSCOPY WITH MEDIALIZATION OF LEFT VOCAL FOLD WITH PROLARYN;  Surgeon: Jason Coop, DO;  Location: [facility];  Service: ENT;  Laterality: Left;   ROTATOR CUFF REPAIR Left    VIDEO BRONCHOSCOPY WITH ENDOBRONCHIAL ULTRASOUND N/A 10/20/2021   Procedure: VIDEO BRONCHOSCOPY WITH ENDOBRONCHIAL ULTRASOUND WITH BIOPSIES;  Surgeon: Melrose Nakayama, MD;  Location: [facility];  Service: Thoracic;  Laterality: N/A;    REVIEW OF SYSTEMS:   Review of Systems  Constitutional: Positive for fatigue, decreased appetite, and  weight loss. Negative for chills or fever.  HENT: Positive for hoarseness.  Positive for some degree of hearing loss in right ear.  Negative for mouth sores, nosebleeds, sore throat and trouble swallowing.   Eyes: Negative for eye problems and icterus.  Respiratory: Positive for occasional cough with eating.  Negative for hemoptysis, shortness of breath and wheezing.   Cardiovascular: Negative for chest pain and leg swelling.  Gastrointestinal: Negative for abdominal pain, constipation, diarrhea, nausea and vomiting.  Genitourinary: Negative for bladder incontinence, difficulty urinating, dysuria, frequency and hematuria.   Musculoskeletal: Negative for back pain, gait problem, neck pain and neck stiffness.  Skin: Negative for itching and rash.  Neurological: Positive for occasional discomfort in the occipital region.  Negative for dizziness, extremity weakness, gait problem, light-headedness and seizures.  Hematological: Negative for adenopathy. Does not bruise/bleed easily.  Psychiatric/Behavioral: Negative for confusion, depression and sleep disturbance. The patient is not nervous/anxious.     PHYSICAL EXAMINATION:  Blood pressure 113/71, pulse 72, temperature 98.2 F (36.8 C), temperature source Oral, resp. rate 14, weight 146 lb 8 oz (66.5 kg), SpO2 100 %.  ECOG PERFORMANCE STATUS: 1  Physical Exam  Constitutional: Oriented to person, place, and time and thin appearing male and in no distress.  HENT:  Head: Normocephalic and atraumatic.  Mouth/Throat: Positive for hoarseness in the voice.  Oropharynx is clear and moist. No oropharyngeal exudate.  Eyes: Conjunctivae are normal. Right eye exhibits no discharge. Left eye exhibits no discharge. No scleral icterus.  Neck: Normal range of motion. Neck supple.  Cardiovascular: Normal rate, regular rhythm, normal heart sounds and intact distal pulses.   Pulmonary/Chest: Effort normal and breath sounds normal. No respiratory distress. No  wheezes. No rales.  Abdominal: Soft. Bowel sounds are normal. Exhibits no distension and no mass. There is no tenderness.  Musculoskeletal: Normal range of motion. Exhibits no edema.  Lymphadenopathy:    No cervical adenopathy.  Neurological: Alert and oriented to person, place, and time. Exhibits normal muscle tone. Gait normal. Coordination normal.  Skin: Skin is warm and dry. No rash noted. Not diaphoretic. No erythema. No pallor.  Psychiatric: Mood, memory and judgment normal.  Vitals reviewed.  LABORATORY DATA: Lab Results  Component Value Date   WBC 4.7 08/11/2022   HGB 13.3 08/11/2022   HCT 37.4 (L) 08/11/2022   MCV 90.3 08/11/2022   PLT 185 08/11/2022      Chemistry      Component Value Date/Time   NA 134 (L) 08/11/2022 0942   K 4.3 08/11/2022 0942   CL 101 08/11/2022 0942   CO2 27 08/11/2022 0942   BUN 9 08/11/2022 0942   CREATININE 0.88 08/11/2022 0942      Component Value Date/Time   CALCIUM 9.4 08/11/2022 0942   ALKPHOS 64 08/11/2022 0942   AST 14 (L) 08/11/2022 0942   ALT 8 08/11/2022 0942   BILITOT 0.8 08/11/2022 0942       RADIOGRAPHIC STUDIES:  CT Chest W Contrast  Result Date: 08/11/2022 CLINICAL DATA:  Assess treatment response of primary left upper lobe small-cell lung cancer. * Tracking Code: BO * EXAM: CT CHEST WITH CONTRAST TECHNIQUE: Multidetector CT imaging of the chest was performed during intravenous contrast administration. RADIATION DOSE REDUCTION: This exam was performed according to the departmental dose-optimization program which includes automated exposure control, adjustment of the mA and/or kV according to patient size and/or use of iterative reconstruction technique. CONTRAST:  46mL OMNIPAQUE IOHEXOL 300 MG/ML  SOLN COMPARISON:  CT 05/07/2022 and older FINDINGS: Cardiovascular: Heart is nonenlarged. Trace pericardial effusion. Normal caliber thoracic aorta. There is a bovine type aortic arch, normal variant. Coronary artery calcifications  are seen. Mediastinum/Nodes: Normal caliber thoracic esophagus. No specific abnormal lymph node enlargement seen in the axillary regions. No hilar or mediastinal nodes. Only a few small less than 1 cm size nodes are seen in the mediastinum, not pathologic by size criteria. Appearance is similar. Tiny cystic area in the left thyroid lobe. Note is made of some high density material along the course of the left vocal cord consistent with history of injection. Please correlate with specific surgical and procedure history. Lungs/Pleura: Advanced emphysematous lung changes particularly in the upper lung zones. Paraseptal and centrilobular changes. Right apical bulla, bleb formation. Scattered scarring and fibrotic changes. No consolidation, pneumothorax or effusion. Calcified nodule in the lingula again seen consistent with old granulomatous disease Upper Abdomen: Adrenal glands are preserved in the upper abdomen. Bosniak 1 upper pole left-sided renal cyst again noted and stable. Replaced left hepatic artery from the left gastric, normal variant. Musculoskeletal: Curvature of the spine with some degenerative changes. IMPRESSION:  No significant interval change. Emphysematous lung changes identified. No recurrent mass lesion, fluid collection or lymph node enlargement in the thorax. Stable presumed postprocedural changes along the left vocal cord Aortic Atherosclerosis (ICD10-I70.0) and Emphysema (ICD10-J43.9). Electronically Signed   By: Jill Side M.D.   On: 08/11/2022 16:15     ASSESSMENT/PLAN:  This is a very pleasant 67 year old Caucasian male diagnosed with limited stage (T1c, N2, M0) small cell lung cancer he presented with a left upper lobe lung mass in addition to left hilar mediastinal lymphadenopathy in April 2023.  The patient underwent 4 cycles of chemotherapy with cisplatin 75 mg/m on days 1 and etoposide 100 mg/m on days 1, 2, and 3 IV every 3 weeks.  This was performed with concurrent radiation.   He then underwent prophylactic cranial irradiation under the care of Dr. Sondra Come which was completed in September 2023.  He has been on observation since that time.  The patient recently had a restaging CT scan performed.  Dr. Julien Nordmann personally and independently reviewed the scan and discussed the results with the patient today.  The scan did not show any evidence of disease progression.  Dr. Julien Nordmann recommends the patient continue on observation with a repeat CT scan of the chest in 3 months.  The patient will continue to follow with Dr. Sondra Come every 3 months for his history of PCI.  He will continue to follow with ENT for his hoarseness and hearing changes.  He is scheduled to see them next week for possible surgery.  He met with a member the nutritionist team today for his poor appetite.  The patient was advised to call immediately if she has any concerning symptoms in the interval. The patient voices understanding of current disease status and treatment options and is in agreement with the current care plan. All questions were answered. The patient knows to call the clinic with any problems, questions or concerns. We can certainly see the patient much sooner if necessary  Orders Placed This Encounter  Procedures   CT Chest W Contrast    Standing Status:   Future    Standing Expiration Date:   08/13/2023    Order Specific Question:   If indicated for the ordered procedure, I authorize the administration of contrast media per Radiology protocol    Answer:   Yes    Order Specific Question:   Does the patient have a contrast media/X-ray dye allergy?    Answer:   No    Order Specific Question:   Preferred imaging location?    Answer:   [facility]   CBC with Differential ([place] Only)    Standing Status:   Future    Standing Expiration Date:   08/14/2023   CMP ([facility] only)    Standing Status:   Future    Standing Expiration Date:   08/14/2023      Tobe Sos  Nelida Mandarino, PA-C 08/13/22  ADDENDUM: Hematology/Oncology Attending: I had a face-to-face encounter with the patient today.  I reviewed his record, lab, scan and recommended his care plan.  This is a very pleasant 67 years old white male with history of limited stage small cell lung cancer diagnosed in April 2023 status post 4 cycles of systemic chemotherapy with cisplatin and [place] side concurrent with radiation and followed by prophylactic cranial irradiation. The patient is currently on observation and he is feeling fine with no concerning complaints except for the hoarseness of his voice and he is seeing by  ENT at [facility] next week. He had repeat CT scan of the chest performed recently.  I personally and independently reviewed the scan and discussed the result with the patient today. His scan showed no concerning findings for disease recurrence or metastasis. I recommended for him to continue on observation with repeat CT scan of the chest in 3 months. The patient was advised to call immediately if he has any other concerning symptoms in the interval. The total time spent in the appointment was 20 minutes. Disclaimer: This note was dictated with voice recognition software. Similar sounding words can inadvertently be transcribed and may be missed upon review.

## 2022-08-13 ENCOUNTER — Inpatient Hospital Stay: Payer: PPO | Admitting: Nutrition

## 2022-08-13 ENCOUNTER — Inpatient Hospital Stay (HOSPITAL_BASED_OUTPATIENT_CLINIC_OR_DEPARTMENT_OTHER): Payer: PPO | Admitting: Physician Assistant

## 2022-08-13 VITALS — BP 113/71 | HR 72 | Temp 98.2°F | Resp 14 | Wt 146.5 lb

## 2022-08-13 DIAGNOSIS — R490 Dysphonia: Secondary | ICD-10-CM | POA: Diagnosis not present

## 2022-08-13 DIAGNOSIS — C3412 Malignant neoplasm of upper lobe, left bronchus or lung: Secondary | ICD-10-CM | POA: Diagnosis not present

## 2022-08-13 DIAGNOSIS — R630 Anorexia: Secondary | ICD-10-CM | POA: Diagnosis not present

## 2022-08-13 DIAGNOSIS — Z923 Personal history of irradiation: Secondary | ICD-10-CM | POA: Diagnosis not present

## 2022-08-13 NOTE — Progress Notes (Signed)
67 year old male diagnosed with Limited Stage Lung cancer and followed by Dr. Julien Nordmann and Dr. Sondra Come.  PMH includes COPD and Arthritis.  Medications include Folvite, Mag ox, Prednisone, and Compazine.   Labs include Na 134.  Height: 67 inches Weight:146 pounds 8 oz Feb 15. UBW: 151 pounds November 2023. BMI: 22.95.  Patient reports he has taste alterations and wt loss after completion of treatment. He has been drinking [employer] Nutrition Plan shakes which provide 150 cal and 30 gm protein. He has noticed an enhanced sense of smell. He has been fatigued but is looking forward to getting back outside and being more active.  Nutrition Diagnosis: Food and Nutrition Related Knowledge Deficit related to cancer and associated treatments as evidenced by no prior need for nutrition related information.  Intervention: Educated to try to eat small, frequent meals and snacks with higher calories and protein. Reviewed tips on adding calories to meals. Provided nutrition fact sheets. Recommended a higher calorie ONS. Patient agreed to try CIB mad with whole milk. Provided samples. Educated on strategies to improve taste alterations, specifically using a baking soda and salt water gargle. Provided nutrition fact sheets.  Monitoring, Evaluation, Goals: Patient will tolerate increased calories and protein to promote increase in lean muscle mass and increased QOL.  Next Visit: No follow up however, patient has RD contact information for questions or concerns.

## 2022-08-17 DIAGNOSIS — H6991 Unspecified Eustachian tube disorder, right ear: Secondary | ICD-10-CM | POA: Diagnosis not present

## 2022-08-17 DIAGNOSIS — J3801 Paralysis of vocal cords and larynx, unilateral: Secondary | ICD-10-CM | POA: Diagnosis not present

## 2022-08-17 DIAGNOSIS — C3490 Malignant neoplasm of unspecified part of unspecified bronchus or lung: Secondary | ICD-10-CM | POA: Diagnosis not present

## 2022-08-17 DIAGNOSIS — R490 Dysphonia: Secondary | ICD-10-CM | POA: Diagnosis not present

## 2022-08-17 DIAGNOSIS — H903 Sensorineural hearing loss, bilateral: Secondary | ICD-10-CM | POA: Diagnosis not present

## 2022-10-06 DIAGNOSIS — H903 Sensorineural hearing loss, bilateral: Secondary | ICD-10-CM | POA: Diagnosis not present

## 2022-10-26 DIAGNOSIS — M9901 Segmental and somatic dysfunction of cervical region: Secondary | ICD-10-CM | POA: Diagnosis not present

## 2022-10-26 DIAGNOSIS — M542 Cervicalgia: Secondary | ICD-10-CM | POA: Diagnosis not present

## 2022-10-26 DIAGNOSIS — M546 Pain in thoracic spine: Secondary | ICD-10-CM | POA: Diagnosis not present

## 2022-10-26 DIAGNOSIS — M9903 Segmental and somatic dysfunction of lumbar region: Secondary | ICD-10-CM | POA: Diagnosis not present

## 2022-10-26 DIAGNOSIS — M9902 Segmental and somatic dysfunction of thoracic region: Secondary | ICD-10-CM | POA: Diagnosis not present

## 2022-10-28 NOTE — Progress Notes (Signed)
Radiation Oncology         [phone]) [phone] ________________________________  Name: Jonathan Castro MRN: [id number]  Date: 10/29/2022  DOB: March 22, 1956  Follow-Up Visit Note  CC: Carylon Perches, MD  Si Gaul, MD  No diagnosis found.  Diagnosis: The encounter diagnosis was Primary small cell carcinoma of upper lobe of left lung (HCC).   Large left upper lobe pulmonary nodule: small cell carcinoma with lymph node metastases   Cancer Staging  Primary small cell carcinoma of upper lobe of left lung (HCC) Staging form: Lung, AJCC 8th Edition - Clinical: Stage IIIA (cT1c, cN2, cM0) - Signed by Si Gaul, MD on 10/30/2021  Interval Since Last Radiation: 7 months and 17 days   Prophylactic cranial radiation  Intent: Curative Radiation Treatment Dates: 02/26/2022 through 03/13/2022 Site Technique Total Dose (Gy) Dose per Fx (Gy) Completed Fx Beam Energies  Brain: Brain IMRT 25/25 2.5 10/10 6X    Intent: Curative Radiation Treatment Dates: 11/25/2021 through 01/06/2022 Site Technique Total Dose (Gy) Dose per Fx (Gy) Completed Fx Beam Energies  Lung, Left: Lung_L 3D 60/60 2 30/30 6X, 10X   Narrative:  The patient returns today for routine 3 month follow-up. He was last seen here for follow-up on 07/30/22. Since his last visit, the patient has a restaging chest CT performed on 08/11/22 which showed no significant interval change in the lungs. Some emphysematous lung changes and stable presumed postprocedural changes along the left vocal cord were noted. CT otherwise showed no recurrent mass lesion, fluid collection or lymph node enlargement in the thorax.  The patient followed up with medical oncology on 08/13/22. During which time, the patient endorsed ongoing issues related to decreased appetite and weight loss (has met with nutrition). He otherwise reported no changes in his breathing and reported using his inhaler on occasion.   With regard to his vocal concerns, the patient was referred  back to Dr. Marene Lenz on 08/17/22. During which time, the patient reported having stable hearing since his ear tube procedure this past December. He also expressed interest in pursuing a permanent medialization thyroplasty for his hoarseness and left vocal fold paralysis (s/p microlaryngoscopy with Prolaryn injection on 04/03/2022). Nasolaryngoscopy performed during this visit demonstrated persistent changes of the left true vocal fold consistent with a previous injection laryngoplasty, with complete vocal cord closure with attempted phonation noted. Based on this, Dr. Marene Lenz would like to reassess the patient in 4 to 6 months or until the injection completely wears off.  At that time, he will likely be scheduled for a type I thyroplasty. A repeat audiogram was also performed during this visit which demonstrated persistent bilateral sloping sensorineural hearing loss, right greater than left. Patient is a candidate to proceed with amplification and is recommended hearing aids which he will be assisted with.      ***                 Allergies:  is allergic to penicillamine, penicillins, and arava [leflunomide].  Meds: Current Outpatient Medications  Medication Sig Dispense Refill   acetaminophen (TYLENOL) 650 MG CR tablet Take 1,300 mg by mouth every 8 (eight) hours as needed for pain. (Patient not taking: Reported on 04/16/2022)     albuterol (VENTOLIN HFA) 108 (90 Base) MCG/ACT inhaler Inhale 2 puffs into the lungs every 6 (six) hours as needed for wheezing or shortness of breath. (Patient not taking: Reported on 04/16/2022)     chlorproMAZINE (THORAZINE) 25 MG tablet TAKE ONE TABLET (25MG  TOTAL) BY MOUTH  THREE TIMES DAILY (Patient not taking: Reported on 03/30/2022) 30 tablet 1   doxycycline (VIBRA-TABS) 100 MG tablet Take 1 tablet (100 mg total) by mouth 2 (two) times daily. (Patient not taking: Reported on 03/30/2022) 20 tablet 0   fexofenadine-pseudoephedrine (ALLEGRA-D) 60-120 MG 12 hr tablet  Take 1 tablet by mouth 2 (two) times daily as needed (allergies). (Patient not taking: Reported on 04/16/2022)     folic acid (FOLVITE) 1 MG tablet Take 1 mg by mouth daily. (Patient not taking: Reported on 07/30/2022)     lidocaine (XYLOCAINE) 2 % solution Use as directed 15 mLs in the mouth or throat as needed for mouth pain. Dllute per instructions given by nursing, swallow 20 min prior to meals and bedtime (Patient not taking: Reported on 03/30/2022) 100 mL 2   magnesium oxide (MAG-OX) 400 (240 Mg) MG tablet Take 1 tablet (400 mg total) by mouth daily. (Patient not taking: Reported on 02/09/2022) 20 tablet 0   methotrexate 50 MG/2ML injection Inject 15 mg into the vein every Wednesday. (Patient not taking: Reported on 04/16/2022)     mometasone (ELOCON) 0.1 % cream Apply 1 Application topically in the morning and at bedtime. (Patient not taking: Reported on 04/16/2022)     oxyCODONE-acetaminophen (PERCOCET/ROXICET) 5-325 MG tablet Take 1 tablet by mouth every 8 (eight) hours as needed for severe pain. (Patient not taking: Reported on 02/09/2022) 30 tablet 0   predniSONE (DELTASONE) 5 MG tablet Take 5 mg by mouth daily as needed (arthritis flare). (Patient not taking: Reported on 04/16/2022)     prochlorperazine (COMPAZINE) 10 MG tablet Take 1 tablet (10 mg total) by mouth every 6 (six) hours as needed for nausea or vomiting. (Patient not taking: Reported on 04/16/2022) 30 tablet 0   No current facility-administered medications for this encounter.    Physical Findings: The patient is in no acute distress. Patient is alert and oriented.  vitals were not taken for this visit. .  No significant changes. Lungs are clear to auscultation bilaterally. Heart has regular rate and rhythm. No palpable cervical, supraclavicular, or axillary adenopathy. Abdomen soft, non-tender, normal bowel sounds.   Lab Findings: Lab Results  Component Value Date   WBC 4.7 08/11/2022   HGB 13.3 08/11/2022   HCT 37.4 (L)  08/11/2022   MCV 90.3 08/11/2022   PLT 185 08/11/2022    Radiographic Findings: No results found.  Impression: The encounter diagnosis was Primary small cell carcinoma of upper lobe of left lung (HCC).   Large left upper lobe pulmonary nodule: small cell carcinoma with lymph node metastases  The patient is recovering from the effects of radiation.  ***  Plan:  ***   *** minutes of total time was spent for this patient encounter, including preparation, face-to-face counseling with the patient and coordination of care, physical exam, and documentation of the encounter. ____________________________________  Billie Lade, PhD, MD  This document serves as a record of services personally performed by Antony Blackbird, MD. It was created on his behalf by Neena Rhymes, a trained medical scribe. The creation of this record is based on the scribe's personal observations and the provider's statements to them. This document has been checked and approved by the attending provider.

## 2022-10-29 ENCOUNTER — Encounter: Payer: Self-pay | Admitting: Radiation Oncology

## 2022-10-29 ENCOUNTER — Ambulatory Visit
Admission: RE | Admit: 2022-10-29 | Discharge: 2022-10-29 | Disposition: A | Discharge status: Home / Self Care | Payer: PPO | Source: Ambulatory Visit | Attending: Radiation Oncology | Admitting: Radiation Oncology

## 2022-10-29 VITALS — BP 104/61 | HR 65 | Temp 96.6°F | Resp 18 | Ht 67.0 in | Wt 145.1 lb

## 2022-10-29 DIAGNOSIS — C3412 Malignant neoplasm of upper lobe, left bronchus or lung: Secondary | ICD-10-CM | POA: Diagnosis not present

## 2022-10-29 DIAGNOSIS — Z923 Personal history of irradiation: Secondary | ICD-10-CM | POA: Diagnosis not present

## 2022-10-29 DIAGNOSIS — C778 Secondary and unspecified malignant neoplasm of lymph nodes of multiple regions: Secondary | ICD-10-CM | POA: Insufficient documentation

## 2022-10-29 DIAGNOSIS — Z79899 Other long term (current) drug therapy: Secondary | ICD-10-CM | POA: Insufficient documentation

## 2022-10-29 DIAGNOSIS — Z87891 Personal history of nicotine dependence: Secondary | ICD-10-CM | POA: Diagnosis not present

## 2022-10-29 NOTE — Progress Notes (Signed)
Jonathan Castro is here today for follow up post radiation to the lung.  Lung Side: Left, patient completed treatment on 01/06/22. Patient also received radiation treatment to brain, patient completed treatment on 03/13/22.  Does the patient complain of any of the following: Pain:No Shortness of breath w/wo exertion: No Cough: Yes, productive cough.  Hemoptysis: No Pain with swallowing: No Swallowing/choking concerns: No Appetite: Good Weight: Wt Readings from Last 3 Encounters:  10/29/22 145 lb 2 oz (65.8 kg)  08/13/22 146 lb 8 oz (66.5 kg)  07/30/22 146 lb 4 oz (66.3 kg)   Energy Level: Good.  Post radiation skin Changes: No Headache:No Vision/auditory:No Fine Motor skills: Noticed change in handwriting.     Additional comments if applicable:  BP 104/61 (BP Location: Left Arm, Patient Position: Sitting)   Pulse 65   Temp (!) 96.6 F (35.9 C) (Temporal)   Resp 18   Ht 5\' 7"  (1.702 m)   Wt 145 lb 2 oz (65.8 kg)   SpO2 100%   BMI 22.73 kg/m

## 2022-11-11 ENCOUNTER — Inpatient Hospital Stay: Payer: PPO | Attending: Internal Medicine

## 2022-11-11 ENCOUNTER — Ambulatory Visit (HOSPITAL_COMMUNITY)
Admission: RE | Admit: 2022-11-11 | Discharge: 2022-11-11 | Disposition: A | Discharge status: Home / Self Care | Payer: PPO | Source: Ambulatory Visit | Attending: Physician Assistant | Admitting: Physician Assistant

## 2022-11-11 DIAGNOSIS — J432 Centrilobular emphysema: Secondary | ICD-10-CM | POA: Insufficient documentation

## 2022-11-11 DIAGNOSIS — C3412 Malignant neoplasm of upper lobe, left bronchus or lung: Secondary | ICD-10-CM | POA: Insufficient documentation

## 2022-11-11 DIAGNOSIS — I2510 Atherosclerotic heart disease of native coronary artery without angina pectoris: Secondary | ICD-10-CM | POA: Diagnosis not present

## 2022-11-11 DIAGNOSIS — J8410 Pulmonary fibrosis, unspecified: Secondary | ICD-10-CM | POA: Insufficient documentation

## 2022-11-11 DIAGNOSIS — M47814 Spondylosis without myelopathy or radiculopathy, thoracic region: Secondary | ICD-10-CM | POA: Diagnosis not present

## 2022-11-11 DIAGNOSIS — C3492 Malignant neoplasm of unspecified part of left bronchus or lung: Secondary | ICD-10-CM | POA: Diagnosis not present

## 2022-11-11 DIAGNOSIS — Z923 Personal history of irradiation: Secondary | ICD-10-CM | POA: Insufficient documentation

## 2022-11-11 DIAGNOSIS — I700 Atherosclerosis of aorta: Secondary | ICD-10-CM | POA: Diagnosis not present

## 2022-11-11 DIAGNOSIS — R918 Other nonspecific abnormal finding of lung field: Secondary | ICD-10-CM | POA: Diagnosis not present

## 2022-11-11 LAB — CBC WITH DIFFERENTIAL (CANCER CENTER ONLY)
Abs Immature Granulocytes: 0.01 10*3/uL (ref 0.00–0.07)
Basophils Absolute: 0 10*3/uL (ref 0.0–0.1)
Basophils Relative: 1 %
Eosinophils Absolute: 0.1 10*3/uL (ref 0.0–0.5)
Eosinophils Relative: 2 %
HCT: 43.2 % (ref 39.0–52.0)
Hemoglobin: 15.1 g/dL (ref 13.0–17.0)
Immature Granulocytes: 0 %
Lymphocytes Relative: 15 %
Lymphs Abs: 0.8 10*3/uL (ref 0.7–4.0)
MCH: 31.3 pg (ref 26.0–34.0)
MCHC: 35 g/dL (ref 30.0–36.0)
MCV: 89.6 fL (ref 80.0–100.0)
Monocytes Absolute: 0.5 10*3/uL (ref 0.1–1.0)
Monocytes Relative: 9 %
Neutro Abs: 3.8 10*3/uL (ref 1.7–7.7)
Neutrophils Relative %: 73 %
Platelet Count: 179 10*3/uL (ref 150–400)
RBC: 4.82 MIL/uL (ref 4.22–5.81)
RDW: 12.5 % (ref 11.5–15.5)
WBC Count: 5.3 10*3/uL (ref 4.0–10.5)
nRBC: 0 % (ref 0.0–0.2)

## 2022-11-11 LAB — CMP (CANCER CENTER ONLY)
ALT: 8 U/L (ref 0–44)
AST: 13 U/L — ABNORMAL LOW (ref 15–41)
Albumin: 4 g/dL (ref 3.5–5.0)
Alkaline Phosphatase: 92 U/L (ref 38–126)
Anion gap: 4 — ABNORMAL LOW (ref 5–15)
BUN: 10 mg/dL (ref 8–23)
CO2: 29 mmol/L (ref 22–32)
Calcium: 9.3 mg/dL (ref 8.9–10.3)
Chloride: 100 mmol/L (ref 98–111)
Creatinine: 0.85 mg/dL (ref 0.61–1.24)
GFR, Estimated: >60 mL/min (ref >60)
Glucose, Bld: 97 mg/dL (ref 70–99)
Potassium: 4.6 mmol/L (ref 3.5–5.1)
Sodium: 133 mmol/L — ABNORMAL LOW (ref 135–145)
Total Bilirubin: 1.2 mg/dL (ref 0.3–1.2)
Total Protein: 6.2 g/dL — ABNORMAL LOW (ref 6.5–8.1)

## 2022-11-11 MED ORDER — IOHEXOL 300 MG/ML  SOLN
75.0000 mL | Freq: Once | INTRAMUSCULAR | Status: AC | PRN
  Administered 2022-11-11: 75 mL via INTRAVENOUS

## 2022-11-11 MED ORDER — SODIUM CHLORIDE (PF) 0.9 % IJ SOLN
INTRAMUSCULAR | Status: AC
  Filled 2022-11-11: qty 50

## 2022-11-16 ENCOUNTER — Inpatient Hospital Stay (HOSPITAL_BASED_OUTPATIENT_CLINIC_OR_DEPARTMENT_OTHER): Payer: PPO | Admitting: Internal Medicine

## 2022-11-16 VITALS — BP 106/65 | HR 65 | Temp 97.4°F | Resp 13 | Wt 143.8 lb

## 2022-11-16 DIAGNOSIS — C3412 Malignant neoplasm of upper lobe, left bronchus or lung: Secondary | ICD-10-CM | POA: Diagnosis not present

## 2022-11-16 DIAGNOSIS — C3490 Malignant neoplasm of unspecified part of unspecified bronchus or lung: Secondary | ICD-10-CM | POA: Diagnosis not present

## 2022-11-16 NOTE — Addendum Note (Signed)
Addended by: Charma Igo on: 11/16/2022 02:59 PM   Modules accepted: Orders

## 2022-11-16 NOTE — Progress Notes (Signed)
[facility] [phone]   [phone]  OFFICE PROGRESS NOTE  Carylon Perches, MD [address] [place] [address]  DIAGNOSIS: Limited stage (T1c, N2, M0) small cell lung cancer presented with left upper lobe lung mass in addition to left hilar and mediastinal lymphadenopathy diagnosed in April 2023.  PRIOR THERAPY:  1) Systemic chemotherapy with cisplatin 75 Mg/M2 on day 1 and 2 etoposide 100 Mg/M2 on days 1, 2 and 3 every 3 weeks.  First cycle 11/10/2021.  This is concurrent with radiotherapy.  Status post 4 cycles. 2) prophylactic cranial irradiation under the care of Dr. Roselind Messier completed on 03/13/2022.  CURRENT THERAPY: Observation  INTERVAL HISTORY: Jonathan Castro 67 y.o. male returns to the clinic today for 3 months follow-up visit.  The patient is feeling fine today with no concerning complaints except for occasional cough.  He denied having any chest pain, shortness of breath or hemoptysis.  The patient denied having any nausea, vomiting, diarrhea or constipation.  He has no headache or visual changes.  He denied having any fever or chills.  He is here today for evaluation with repeat CT scan of the chest for restaging of his disease.  MEDICAL HISTORY: Past Medical History:  Diagnosis Date   Arthritis    Cancer (HCC)    COPD (chronic obstructive pulmonary disease) (HCC)    History of radiation therapy    Left Lung- 11/25/21-01/06/22- Dr. Antony Blackbird   History of radiation therapy    Brain- 02/26/22-03/13/22- Dr. Antony Blackbird    ALLERGIES:  is allergic to penicillamine, penicillins, and arava [leflunomide].  MEDICATIONS:  Current Outpatient Medications  Medication Sig Dispense Refill   acetaminophen (TYLENOL) 650 MG CR tablet Take 1,300 mg by mouth every 8 (eight) hours as needed for pain. (Patient not taking: Reported on 04/16/2022)     albuterol (VENTOLIN HFA) 108 (90 Base) MCG/ACT inhaler Inhale 2 puffs into the lungs every  6 (six) hours as needed for wheezing or shortness of breath. (Patient not taking: Reported on 04/16/2022)     chlorproMAZINE (THORAZINE) 25 MG tablet TAKE ONE TABLET (25MG  TOTAL) BY MOUTH THREE TIMES DAILY (Patient not taking: Reported on 03/30/2022) 30 tablet 1   doxycycline (VIBRA-TABS) 100 MG tablet Take 1 tablet (100 mg total) by mouth 2 (two) times daily. (Patient not taking: Reported on 03/30/2022) 20 tablet 0   fexofenadine-pseudoephedrine (ALLEGRA-D) 60-120 MG 12 hr tablet Take 1 tablet by mouth 2 (two) times daily as needed (allergies). (Patient not taking: Reported on 04/16/2022)     folic acid (FOLVITE) 1 MG tablet Take 1 mg by mouth daily. (Patient not taking: Reported on 07/30/2022)     lidocaine (XYLOCAINE) 2 % solution Use as directed 15 mLs in the mouth or throat as needed for mouth pain. [place] per instructions given by nursing, swallow 20 min prior to meals and bedtime (Patient not taking: Reported on 03/30/2022) 100 mL 2   magnesium oxide (MAG-OX) 400 (240 Mg) MG tablet Take 1 tablet (400 mg total) by mouth daily. (Patient not taking: Reported on 02/09/2022) 20 tablet 0   methotrexate 50 MG/2ML injection Inject 15 mg into the vein every Wednesday. (Patient not taking: Reported on 04/16/2022)     mometasone (ELOCON) 0.1 % cream Apply 1 Application topically in the morning and at bedtime. (Patient not taking: Reported on 04/16/2022)     oxyCODONE-acetaminophen (PERCOCET/ROXICET) 5-325 MG tablet Take 1 tablet by mouth every 8 (eight) hours as needed  for severe pain. (Patient not taking: Reported on 02/09/2022) 30 tablet 0   predniSONE (DELTASONE) 5 MG tablet Take 5 mg by mouth daily as needed (arthritis flare). (Patient not taking: Reported on 04/16/2022)     prochlorperazine (COMPAZINE) 10 MG tablet Take 1 tablet (10 mg total) by mouth every 6 (six) hours as needed for nausea or vomiting. (Patient not taking: Reported on 04/16/2022) 30 tablet 0   No current facility-administered medications  for this visit.    SURGICAL HISTORY:  Past Surgical History:  Procedure Laterality Date   MEDIASTINOSCOPY N/A 10/20/2021   Procedure: MEDIASTINOSCOPY;  Surgeon: Loreli Slot, MD;  Location: [facility] OR;  Service: Thoracic;  Laterality: N/A;   MICROLARYNGOSCOPY W/VOCAL CORD INJECTION Left 04/03/2022   Procedure: MICROLARYNGOSCOPY WITH MEDIALIZATION OF LEFT VOCAL FOLD WITH PROLARYN;  Surgeon: Laren Boom, DO;  Location: [facility] OR;  Service: ENT;  Laterality: Left;   ROTATOR CUFF REPAIR Left    VIDEO BRONCHOSCOPY WITH ENDOBRONCHIAL ULTRASOUND N/A 10/20/2021   Procedure: VIDEO BRONCHOSCOPY WITH ENDOBRONCHIAL ULTRASOUND WITH BIOPSIES;  Surgeon: Loreli Slot, MD;  Location: [facility] OR;  Service: Thoracic;  Laterality: N/A;    REVIEW OF SYSTEMS:  A comprehensive review of systems was negative except for: Respiratory: positive for cough   PHYSICAL EXAMINATION: General appearance: alert, cooperative, and no distress Head: Normocephalic, without obvious abnormality, atraumatic Neck: no adenopathy, no JVD, supple, symmetrical, trachea midline, and thyroid not enlarged, symmetric, no tenderness/mass/nodules Lymph nodes: Cervical, supraclavicular, and axillary nodes normal. Resp: clear to auscultation bilaterally Back: symmetric, no curvature. ROM normal. No CVA tenderness. Cardio: regular rate and rhythm, S1, S2 normal, no murmur, click, rub or gallop GI: soft, non-tender; bowel sounds normal; no masses,  no organomegaly Extremities: extremities normal, atraumatic, no cyanosis or edema  ECOG PERFORMANCE STATUS: 1 - Symptomatic but completely ambulatory  Blood pressure 106/65, pulse 65, temperature (!) 97.4 F (36.3 C), temperature source Oral, resp. rate 13, weight 143 lb 12.8 oz (65.2 kg), SpO2 99 %.  LABORATORY DATA: Lab Results  Component Value Date   WBC 5.3 11/11/2022   HGB 15.1 11/11/2022   HCT 43.2 11/11/2022   MCV 89.6 11/11/2022   PLT 179 11/11/2022      Chemistry       Component Value Date/Time   NA 133 (L) 11/11/2022 0921   K 4.6 11/11/2022 0921   CL 100 11/11/2022 0921   CO2 29 11/11/2022 0921   BUN 10 11/11/2022 0921   CREATININE 0.85 11/11/2022 0921      Component Value Date/Time   CALCIUM 9.3 11/11/2022 0921   ALKPHOS 92 11/11/2022 0921   AST 13 (L) 11/11/2022 0921   ALT 8 11/11/2022 0921   BILITOT 1.2 11/11/2022 0921       RADIOGRAPHIC STUDIES: CT Chest W Contrast  Result Date: 11/15/2022 CLINICAL DATA:  Small cell left lung cancer. Restaging. * Tracking Code: BO * EXAM: CT CHEST WITH CONTRAST TECHNIQUE: Multidetector CT imaging of the chest was performed during intravenous contrast administration. RADIATION DOSE REDUCTION: This exam was performed according to the departmental dose-optimization program which includes automated exposure control, adjustment of the mA and/or kV according to patient size and/or use of iterative reconstruction technique. CONTRAST:  75mL OMNIPAQUE IOHEXOL 300 MG/ML  SOLN COMPARISON:  08/11/2022 chest CT. FINDINGS: Cardiovascular: Normal heart size. No significant pericardial effusion/thickening. Left anterior descending and left circumflex coronary atherosclerosis. Atherosclerotic nonaneurysmal thoracic aorta. Normal caliber pulmonary arteries. No central pulmonary emboli. Mediastinum/Nodes: No significant thyroid nodules. Unremarkable esophagus.  No axillary adenopathy. No recurrent pathologically enlarged mediastinal or hilar nodes. Lungs/Pleura: No pneumothorax. No pleural effusion. Severe centrilobular emphysema with diffuse bronchial wall thickening. Tiny 0.2 cm subpleural peripheral right middle lobe solid pulmonary nodule (series 8/image 129), stable. Stable 0.4 cm calcified pulmonary granuloma in the lingula. New curvilinear small parenchymal band in the anteromedial left upper lobe (series 8/image 80). New clustered indistinct tiny nodular opacities in the anterior left upper lobe up to 0.3 cm (series 8/image 76).  Otherwise no new significant pulmonary nodules. Upper abdomen: Simple upper left renal cysts, largest 3.2 cm, for which no follow-up imaging is recommended. Musculoskeletal: No aggressive appearing focal osseous lesions. Moderate thoracic spondylosis. IMPRESSION: 1. No findings highly suspicious for recurrent metastatic disease in the chest. No recurrent thoracic adenopathy. 2. New clustered indistinct tiny nodular opacities in the anterior left upper lobe up to 0.3 cm, nonspecific, favor inflammatory. New curvilinear small parenchymal band in the anteromedial left upper lobe. Recommend attention to these findings on follow-up noncontrast chest CT in 3-6 months. 3. Two-vessel coronary atherosclerosis. 4. Aortic Atherosclerosis (ICD10-I70.0) and Emphysema (ICD10-J43.9). Electronically Signed   By: Delbert Phenix M.D.   On: 11/15/2022 13:10    ASSESSMENT AND PLAN: This is a very pleasant 67 years old white male recently diagnosed with limited stage (T1c, N2, M0) small cell lung cancer presented with left upper lobe lung mass in addition to left hilar and mediastinal lymphadenopathy diagnosed in April 2023. The patient underwent systemic chemotherapy with cisplatin 75 Mg/M2 on day 1 and oh etoposide 100 Mg/M2 on days 1, 2 and 3 every 3 weeks.  Status post 4 cycles.  This was concurrent with radiotherapy followed by prophylactic cranial irradiation. The patient is currently on observation and he is feeling fine with no concerning complaints. He had repeat CT scan of the chest performed recently.  I personally and independently reviewed the scan images and discussed the result with the patient today. His scan showed no concerning findings for disease recurrence or metastasis but there was some mild inflammatory process in the anterior left upper lobe that need close monitoring. I recommended for the patient to continue on observation with repeat CT scan of the chest in 4 months. He was advised to call immediately  if he has any concerning symptoms in the interval. The patient voices understanding of current disease status and treatment options and is in agreement with the current care plan.  All questions were answered. The patient knows to call the clinic with any problems, questions or concerns. We can certainly see the patient much sooner if necessary.  The total time spent in the appointment was 20 minutes.  Disclaimer: This note was dictated with voice recognition software. Similar sounding words can inadvertently be transcribed and may not be corrected upon review.

## 2022-12-17 DIAGNOSIS — Z923 Personal history of irradiation: Secondary | ICD-10-CM | POA: Diagnosis not present

## 2022-12-17 DIAGNOSIS — J3801 Paralysis of vocal cords and larynx, unilateral: Secondary | ICD-10-CM | POA: Diagnosis not present

## 2022-12-17 DIAGNOSIS — Z9221 Personal history of antineoplastic chemotherapy: Secondary | ICD-10-CM | POA: Diagnosis not present

## 2022-12-17 DIAGNOSIS — Z9889 Other specified postprocedural states: Secondary | ICD-10-CM | POA: Diagnosis not present

## 2022-12-17 DIAGNOSIS — H903 Sensorineural hearing loss, bilateral: Secondary | ICD-10-CM | POA: Diagnosis not present

## 2022-12-17 DIAGNOSIS — Z85118 Personal history of other malignant neoplasm of bronchus and lung: Secondary | ICD-10-CM | POA: Diagnosis not present

## 2022-12-17 DIAGNOSIS — H938X3 Other specified disorders of ear, bilateral: Secondary | ICD-10-CM | POA: Diagnosis not present

## 2022-12-17 DIAGNOSIS — Z9622 Myringotomy tube(s) status: Secondary | ICD-10-CM | POA: Diagnosis not present

## 2022-12-24 DIAGNOSIS — K219 Gastro-esophageal reflux disease without esophagitis: Secondary | ICD-10-CM | POA: Diagnosis not present

## 2022-12-24 DIAGNOSIS — M25511 Pain in right shoulder: Secondary | ICD-10-CM | POA: Diagnosis not present

## 2022-12-24 DIAGNOSIS — Z6822 Body mass index (BMI) 22.0-22.9, adult: Secondary | ICD-10-CM | POA: Diagnosis not present

## 2022-12-24 DIAGNOSIS — M1991 Primary osteoarthritis, unspecified site: Secondary | ICD-10-CM | POA: Diagnosis not present

## 2022-12-24 DIAGNOSIS — C3490 Malignant neoplasm of unspecified part of unspecified bronchus or lung: Secondary | ICD-10-CM | POA: Diagnosis not present

## 2022-12-24 DIAGNOSIS — M5030 Other cervical disc degeneration, unspecified cervical region: Secondary | ICD-10-CM | POA: Diagnosis not present

## 2022-12-24 DIAGNOSIS — M0579 Rheumatoid arthritis with rheumatoid factor of multiple sites without organ or systems involvement: Secondary | ICD-10-CM | POA: Diagnosis not present

## 2023-02-03 DIAGNOSIS — M9903 Segmental and somatic dysfunction of lumbar region: Secondary | ICD-10-CM | POA: Diagnosis not present

## 2023-02-03 DIAGNOSIS — M546 Pain in thoracic spine: Secondary | ICD-10-CM | POA: Diagnosis not present

## 2023-02-03 DIAGNOSIS — M9901 Segmental and somatic dysfunction of cervical region: Secondary | ICD-10-CM | POA: Diagnosis not present

## 2023-02-03 DIAGNOSIS — M9902 Segmental and somatic dysfunction of thoracic region: Secondary | ICD-10-CM | POA: Diagnosis not present

## 2023-02-03 DIAGNOSIS — M62830 Muscle spasm of back: Secondary | ICD-10-CM | POA: Diagnosis not present

## 2023-02-08 DIAGNOSIS — M62830 Muscle spasm of back: Secondary | ICD-10-CM | POA: Diagnosis not present

## 2023-02-08 DIAGNOSIS — M9901 Segmental and somatic dysfunction of cervical region: Secondary | ICD-10-CM | POA: Diagnosis not present

## 2023-02-08 DIAGNOSIS — M546 Pain in thoracic spine: Secondary | ICD-10-CM | POA: Diagnosis not present

## 2023-02-08 DIAGNOSIS — M9903 Segmental and somatic dysfunction of lumbar region: Secondary | ICD-10-CM | POA: Diagnosis not present

## 2023-02-08 DIAGNOSIS — M9902 Segmental and somatic dysfunction of thoracic region: Secondary | ICD-10-CM | POA: Diagnosis not present

## 2023-02-22 DIAGNOSIS — B880 Other acariasis: Secondary | ICD-10-CM | POA: Diagnosis not present

## 2023-03-15 ENCOUNTER — Ambulatory Visit (HOSPITAL_COMMUNITY)
Admission: RE | Admit: 2023-03-15 | Discharge: 2023-03-15 | Disposition: A | Discharge status: Home / Self Care | Payer: PPO | Source: Ambulatory Visit | Attending: Internal Medicine | Admitting: Internal Medicine

## 2023-03-15 ENCOUNTER — Inpatient Hospital Stay: Payer: PPO | Attending: Physician Assistant

## 2023-03-15 DIAGNOSIS — I700 Atherosclerosis of aorta: Secondary | ICD-10-CM | POA: Diagnosis not present

## 2023-03-15 DIAGNOSIS — J432 Centrilobular emphysema: Secondary | ICD-10-CM | POA: Diagnosis not present

## 2023-03-15 DIAGNOSIS — C3490 Malignant neoplasm of unspecified part of unspecified bronchus or lung: Secondary | ICD-10-CM | POA: Diagnosis not present

## 2023-03-15 DIAGNOSIS — Z923 Personal history of irradiation: Secondary | ICD-10-CM | POA: Insufficient documentation

## 2023-03-15 DIAGNOSIS — C3412 Malignant neoplasm of upper lobe, left bronchus or lung: Secondary | ICD-10-CM | POA: Insufficient documentation

## 2023-03-15 DIAGNOSIS — Z9221 Personal history of antineoplastic chemotherapy: Secondary | ICD-10-CM | POA: Insufficient documentation

## 2023-03-15 LAB — CBC WITH DIFFERENTIAL (CANCER CENTER ONLY)
Abs Immature Granulocytes: 0.02 10*3/uL (ref 0.00–0.07)
Basophils Absolute: 0 10*3/uL (ref 0.0–0.1)
Basophils Relative: 1 %
Eosinophils Absolute: 0.2 10*3/uL (ref 0.0–0.5)
Eosinophils Relative: 3 %
HCT: 42.5 % (ref 39.0–52.0)
Hemoglobin: 15 g/dL (ref 13.0–17.0)
Immature Granulocytes: 0 %
Lymphocytes Relative: 14 %
Lymphs Abs: 0.8 10*3/uL (ref 0.7–4.0)
MCH: 32.3 pg (ref 26.0–34.0)
MCHC: 35.3 g/dL (ref 30.0–36.0)
MCV: 91.4 fL (ref 80.0–100.0)
Monocytes Absolute: 0.6 10*3/uL (ref 0.1–1.0)
Monocytes Relative: 10 %
Neutro Abs: 4.3 10*3/uL (ref 1.7–7.7)
Neutrophils Relative %: 72 %
Platelet Count: 194 10*3/uL (ref 150–400)
RBC: 4.65 MIL/uL (ref 4.22–5.81)
RDW: 13.3 % (ref 11.5–15.5)
WBC Count: 5.9 10*3/uL (ref 4.0–10.5)
nRBC: 0 % (ref 0.0–0.2)

## 2023-03-15 LAB — CMP (CANCER CENTER ONLY)
ALT: 9 U/L (ref 0–44)
AST: 14 U/L — ABNORMAL LOW (ref 15–41)
Albumin: 4.1 g/dL (ref 3.5–5.0)
Alkaline Phosphatase: 100 U/L (ref 38–126)
Anion gap: 5 (ref 5–15)
BUN: 10 mg/dL (ref 8–23)
CO2: 27 mmol/L (ref 22–32)
Calcium: 9.2 mg/dL (ref 8.9–10.3)
Chloride: 100 mmol/L (ref 98–111)
Creatinine: 0.92 mg/dL (ref 0.61–1.24)
GFR, Estimated: >60 mL/min (ref >60)
Glucose, Bld: 90 mg/dL (ref 70–99)
Potassium: 4.6 mmol/L (ref 3.5–5.1)
Sodium: 132 mmol/L — ABNORMAL LOW (ref 135–145)
Total Bilirubin: 1.1 mg/dL (ref 0.3–1.2)
Total Protein: 6.5 g/dL (ref 6.5–8.1)

## 2023-03-15 MED ORDER — IOHEXOL 300 MG/ML  SOLN
75.0000 mL | Freq: Once | INTRAMUSCULAR | Status: AC | PRN
  Administered 2023-03-15: 75 mL via INTRAVENOUS

## 2023-03-17 ENCOUNTER — Ambulatory Visit: Payer: PPO | Admitting: Internal Medicine

## 2023-03-17 NOTE — Progress Notes (Signed)
[facility] OFFICE PROGRESS NOTE  Jonathan Perches, MD [address] [place] [address]  DIAGNOSIS: Limited stage (T1c, N2, M0) small cell lung cancer presented with left upper lobe lung mass in addition to left hilar and mediastinal lymphadenopathy diagnosed in April 2023.   PRIOR THERAPY: 1) Systemic chemotherapy with cisplatin 75 Mg/M2 on day 1 and 2 etoposide 100 Mg/M2 on days 1, 2 and 3 every 3 weeks.  First cycle 11/10/2021.  This is concurrent with radiotherapy.  Status post 4 cycles. 2) prophylactic cranial irradiation under the care of Dr. Roselind [facility] completed on 03/13/2022.  CURRENT THERAPY: Observation   INTERVAL HISTORY: Jonathan Castro 67 y.o. male returns to the clinic today for a 4 month follow-up visit.  The patient has a history of small cell lung cancer.  He is currently on observation and feeling well.  He had completed his systemic treatment and PCI in 2023.  He denies any major changes in his health since he was last seen.  He denies any fever, chills, night sweats, or unexplained weight loss.  He reports that he is eating good now without any limitations and his taste is normal.  He reports his breathing is "good".  He does not have any significant shortness of breath with his day-to-day activities such as mowing the lawn.  He denies any cough.  He sometimes uses an inhaler if needed.  He denies any chest pain or hemoptysis.  He denies any nausea, vomiting, diarrhea, or constipation.  He denies any headaches.  He mentions some worsening blurry vision but attributes this to needing new glasses.  He recently had a restaging CT scan performed.  He is here today for evaluation to review his scan results.  MEDICAL HISTORY: Past Medical History:  Diagnosis Date   Arthritis    Cancer (HCC)    COPD (chronic obstructive pulmonary disease) (HCC)    History of radiation therapy    Left Lung- 11/25/21-01/06/22- Dr. Antony Castro   History of radiation therapy     Brain- 02/26/22-03/13/22- Dr. Antony Castro    ALLERGIES:  is allergic to penicillamine, penicillins, and arava [leflunomide].  MEDICATIONS:  Current Outpatient Medications  Medication Sig Dispense Refill   methotrexate 50 MG/2ML injection Inject 15 mg into the vein every Wednesday. (Patient not taking: Reported on 04/16/2022)     No current facility-administered medications for this visit.    SURGICAL HISTORY:  Past Surgical History:  Procedure Laterality Date   MEDIASTINOSCOPY N/A 10/20/2021   Procedure: MEDIASTINOSCOPY;  Surgeon: Jonathan Slot, MD;  Location: [facility] OR;  Service: Thoracic;  Laterality: N/A;   MICROLARYNGOSCOPY W/VOCAL CORD INJECTION Left 04/03/2022   Procedure: MICROLARYNGOSCOPY WITH MEDIALIZATION OF LEFT VOCAL FOLD WITH PROLARYN;  Surgeon: Jonathan Boom, DO;  Location: [facility] OR;  Service: ENT;  Laterality: Left;   ROTATOR CUFF REPAIR Left    VIDEO BRONCHOSCOPY WITH ENDOBRONCHIAL ULTRASOUND N/A 10/20/2021   Procedure: VIDEO BRONCHOSCOPY WITH ENDOBRONCHIAL ULTRASOUND WITH BIOPSIES;  Surgeon: Jonathan Slot, MD;  Location: [facility] OR;  Service: Thoracic;  Laterality: N/A;    REVIEW OF SYSTEMS:   Review of Systems  Constitutional: Negative for appetite change, chills, fatigue, fever and unexpected weight change.  HENT: Negative for mouth sores, nosebleeds, sore throat and trouble swallowing.   Eyes: Negative for eye problems and icterus.  Respiratory: Negative for cough, hemoptysis, shortness of breath and wheezing.   Cardiovascular: Negative for chest pain and leg swelling.  Gastrointestinal: Negative for abdominal pain, constipation,  diarrhea, nausea and vomiting.  Genitourinary: Negative for bladder incontinence, difficulty urinating, dysuria, frequency and hematuria.   Musculoskeletal: Negative for back pain, gait problem, neck pain and neck stiffness.  Skin: Negative for itching and rash.  Neurological: Negative for dizziness, extremity weakness, gait  problem, headaches, light-headedness and seizures.  Hematological: Negative for adenopathy. Does not bruise/bleed easily.  Psychiatric/Behavioral: Negative for confusion, depression and sleep disturbance. The patient is not nervous/anxious.     PHYSICAL EXAMINATION:  Blood pressure 127/74, pulse 65, temperature 97.6 F (36.4 C), temperature source Oral, resp. rate 16, SpO2 99%.  ECOG PERFORMANCE STATUS: 1  Physical Exam  Constitutional: Oriented to person, place, and time and thin appearing male and in no distress.  HENT:  Head: Normocephalic and atraumatic.  Mouth/Throat: Positive for hoarseness in the voice.  Oropharynx is clear and moist. No oropharyngeal exudate.  Eyes: Conjunctivae are normal. Right eye exhibits no discharge. Left eye exhibits no discharge. No scleral icterus.  Neck: Normal range of motion. Neck supple.  Cardiovascular: Normal rate, regular rhythm, normal heart sounds and intact distal pulses.   Pulmonary/Chest: Effort normal and breath sounds normal. No respiratory distress. No wheezes. No rales.  Abdominal: Soft. Bowel sounds are normal. Exhibits no distension and no mass. There is no tenderness.  Musculoskeletal: Normal range of motion. Exhibits no edema.  Lymphadenopathy:    No cervical adenopathy.  Neurological: Alert and oriented to person, place, and time. Exhibits normal muscle tone. Gait normal. Coordination normal.  Skin: Skin is warm and dry. No rash noted. Not diaphoretic. No erythema. No pallor.  Psychiatric: Mood, memory and judgment normal.  Vitals reviewed.  LABORATORY DATA: Lab Results  Component Value Date   WBC 5.9 03/15/2023   HGB 15.0 03/15/2023   HCT 42.5 03/15/2023   MCV 91.4 03/15/2023   PLT 194 03/15/2023      Chemistry      Component Value Date/Time   NA 132 (L) 03/15/2023 0951   K 4.6 03/15/2023 0951   CL 100 03/15/2023 0951   CO2 27 03/15/2023 0951   BUN 10 03/15/2023 0951   CREATININE 0.92 03/15/2023 0951       Component Value Date/Time   CALCIUM 9.2 03/15/2023 0951   ALKPHOS 100 03/15/2023 0951   AST 14 (L) 03/15/2023 0951   ALT 9 03/15/2023 0951   BILITOT 1.1 03/15/2023 0951       RADIOGRAPHIC STUDIES:  CT Chest W Contrast  Result Date: 03/24/2023 CLINICAL DATA:  Small-cell lung cancer. Restaging. * Tracking Code: BO * EXAM: CT CHEST WITH CONTRAST TECHNIQUE: Multidetector CT imaging of the chest was performed during intravenous contrast administration. RADIATION DOSE REDUCTION: This exam was performed according to the departmental dose-optimization program which includes automated exposure control, adjustment of the mA and/or kV according to patient size and/or use of iterative reconstruction technique. CONTRAST:  75mL OMNIPAQUE IOHEXOL 300 MG/ML  SOLN COMPARISON:  11/11/2022 FINDINGS: Cardiovascular: The heart size is normal. No substantial pericardial effusion. Coronary artery calcification is evident. No thoracic aortic aneurysm. No substantial atherosclerosis of the thoracic aorta. Mediastinum/Nodes: No mediastinal lymphadenopathy. There is no hilar lymphadenopathy. The esophagus has normal imaging features. There is no axillary lymphadenopathy. Lungs/Pleura: Centrilobular and paraseptal emphysema evident. Bullous changes noted right apex. The tiny subpleural right middle lobe nodule identified previously is stable on 123/5 today. Clustered micro nodularity in the anterior left upper lobe is similar although bandlike component seen on 79/8 previously has largely resolved in the interval. No new suspicious pulmonary nodule or  mass. No focal airspace consolidation. There is no evidence of pleural effusion. Upper Abdomen: Stable left renal cyst. Musculoskeletal: No worrisome lytic or sclerotic osseous abnormality. IMPRESSION: 1. Stable exam. No new or progressive findings to suggest recurrent or metastatic disease. 2. Clustered micro nodularity in the anterior left upper lobe is similar although bandlike  component seen previously has largely resolved in the interval. Imaging features likely reflect sequelae of infection/inflammation. Continued attention on follow-up restaging studies recommended. 3. Aortic Atherosclerosis (ICD10-I70.0) and Emphysema (ICD10-J43.9). Electronically Signed   By: Jonathan [facility] M.D.   On: 03/24/2023 08:18     ASSESSMENT/PLAN:  This is a very pleasant 67 year old Caucasian male diagnosed with limited stage (T1c, N2, M0) small cell lung cancer he presented with a left upper lobe lung mass in addition to left hilar mediastinal lymphadenopathy in April 2023.   The patient underwent 4 cycles of chemotherapy with cisplatin 75 mg/m on days 1 and etoposide 100 mg/m on days 1, 2, and 3 IV every 3 weeks.  This was performed with concurrent radiation.  He then underwent prophylactic cranial irradiation under the care of Dr. Roselind [facility] which was completed in September 2023.   He has been on observation since that time.  The patient recently had a restaging CT scan performed.  Dr. Arbutus Castro personally and independently reviewed the scan and discussed the results with the patient today.  The scan does not show any signs of disease progression..  Dr. Arbutus [facility] recommends the patient continue on observation with a repeat CT scan of the chest in 4 months.  We will see him back for follow-up visit about 7 to 10 days after the CT scan to review the results.  The patient was advised to call immediately if he has any concerning symptoms in the interval. The patient voices understanding of current disease status and treatment options and is in agreement with the current care plan. All questions were answered. The patient knows to call the clinic with any problems, questions or concerns. We can certainly see the patient much sooner if necessary    Orders Placed This Encounter  Procedures   CT Chest W Contrast    Standing Status:   Future    Standing Expiration Date:   03/23/2024    Order Specific  Question:   If indicated for the ordered procedure, I authorize the administration of contrast media per Radiology protocol    Answer:   Yes    Order Specific Question:   Does the patient have a contrast media/X-ray dye allergy?    Answer:   No    Order Specific Question:   Preferred imaging location?    Answer:   [facility]   CBC with Differential ([facility])    Standing Status:   Future    Standing Expiration Date:   03/23/2024   CMP ([facility] only)    Standing Status:   Future    Standing Expiration Date:   03/23/2024      Jonathan Abraham Averyanna Sax, PA-C 03/24/23  ADDENDUM: Hematology/Oncology Attending: I had a face-to-face encounter with the patient today.  I reviewed his record, lab, scan and recommended his care plan.  This is a very pleasant 67 years old white male with limited stage small cell lung cancer presented with left upper lobe lung mass in addition to left hilar and mediastinal lymphadenopathy diagnosed in April 2023.  He status post systemic chemotherapy with cisplatin and etoposide for 4 cycles concurrent with radiation and followed  by prophylactic cranial irradiation completed on 03/13/2022.  The patient has been on observation since that time and he is feeling fine with no concerning complaints. He had repeat CT scan of the chest performed recently.  I personally and independently reviewed the scan and discussed the result with the patient today. His scan showed no concerning findings for disease recurrence or metastasis. I recommended for him to continue on observation with repeat CT scan of the chest in 4 months. The patient was advised to call immediately if he has any other concerning symptoms in the interval. The total time spent in the appointment was 20 minutes. Disclaimer: This note was dictated with voice recognition software. Similar sounding words can inadvertently be transcribed and may be missed upon review. Lajuana Matte, MD

## 2023-03-24 ENCOUNTER — Other Ambulatory Visit: Payer: Self-pay

## 2023-03-24 ENCOUNTER — Inpatient Hospital Stay: Payer: PPO | Admitting: Physician Assistant

## 2023-03-24 VITALS — BP 127/74 | HR 65 | Temp 97.6°F | Resp 16

## 2023-03-24 DIAGNOSIS — C3412 Malignant neoplasm of upper lobe, left bronchus or lung: Secondary | ICD-10-CM | POA: Diagnosis not present

## 2023-03-24 DIAGNOSIS — Z9221 Personal history of antineoplastic chemotherapy: Secondary | ICD-10-CM | POA: Diagnosis not present

## 2023-03-24 DIAGNOSIS — Z923 Personal history of irradiation: Secondary | ICD-10-CM | POA: Diagnosis not present

## 2023-03-25 DIAGNOSIS — L300 Nummular dermatitis: Secondary | ICD-10-CM | POA: Diagnosis not present

## 2023-03-25 DIAGNOSIS — M5030 Other cervical disc degeneration, unspecified cervical region: Secondary | ICD-10-CM | POA: Diagnosis not present

## 2023-03-25 DIAGNOSIS — M0579 Rheumatoid arthritis with rheumatoid factor of multiple sites without organ or systems involvement: Secondary | ICD-10-CM | POA: Diagnosis not present

## 2023-03-25 DIAGNOSIS — M71341 Other bursal cyst, right hand: Secondary | ICD-10-CM | POA: Diagnosis not present

## 2023-03-25 DIAGNOSIS — K219 Gastro-esophageal reflux disease without esophagitis: Secondary | ICD-10-CM | POA: Diagnosis not present

## 2023-03-25 DIAGNOSIS — M1991 Primary osteoarthritis, unspecified site: Secondary | ICD-10-CM | POA: Diagnosis not present

## 2023-03-25 DIAGNOSIS — D1801 Hemangioma of skin and subcutaneous tissue: Secondary | ICD-10-CM | POA: Diagnosis not present

## 2023-03-25 DIAGNOSIS — Z6823 Body mass index (BMI) 23.0-23.9, adult: Secondary | ICD-10-CM | POA: Diagnosis not present

## 2023-03-25 DIAGNOSIS — D2361 Other benign neoplasm of skin of right upper limb, including shoulder: Secondary | ICD-10-CM | POA: Diagnosis not present

## 2023-03-25 DIAGNOSIS — L814 Other melanin hyperpigmentation: Secondary | ICD-10-CM | POA: Diagnosis not present

## 2023-03-25 DIAGNOSIS — L821 Other seborrheic keratosis: Secondary | ICD-10-CM | POA: Diagnosis not present

## 2023-07-14 ENCOUNTER — Inpatient Hospital Stay: Payer: PPO | Attending: Internal Medicine

## 2023-07-14 ENCOUNTER — Ambulatory Visit (HOSPITAL_COMMUNITY)
Admission: RE | Admit: 2023-07-14 | Discharge: 2023-07-14 | Disposition: A | Discharge status: Home / Self Care | Payer: PPO | Source: Ambulatory Visit | Attending: Physician Assistant | Admitting: Physician Assistant

## 2023-07-14 DIAGNOSIS — J432 Centrilobular emphysema: Secondary | ICD-10-CM | POA: Diagnosis not present

## 2023-07-14 DIAGNOSIS — C3412 Malignant neoplasm of upper lobe, left bronchus or lung: Secondary | ICD-10-CM | POA: Insufficient documentation

## 2023-07-14 DIAGNOSIS — Z9221 Personal history of antineoplastic chemotherapy: Secondary | ICD-10-CM | POA: Diagnosis not present

## 2023-07-14 DIAGNOSIS — Z85118 Personal history of other malignant neoplasm of bronchus and lung: Secondary | ICD-10-CM | POA: Insufficient documentation

## 2023-07-14 DIAGNOSIS — M79602 Pain in left arm: Secondary | ICD-10-CM | POA: Diagnosis not present

## 2023-07-14 DIAGNOSIS — I700 Atherosclerosis of aorta: Secondary | ICD-10-CM | POA: Diagnosis not present

## 2023-07-14 DIAGNOSIS — R531 Weakness: Secondary | ICD-10-CM | POA: Insufficient documentation

## 2023-07-14 LAB — CBC WITH DIFFERENTIAL (CANCER CENTER ONLY)
Abs Immature Granulocytes: 0.02 10*3/uL (ref 0.00–0.07)
Basophils Absolute: 0 10*3/uL (ref 0.0–0.1)
Basophils Relative: 1 %
Eosinophils Absolute: 0.1 10*3/uL (ref 0.0–0.5)
Eosinophils Relative: 2 %
HCT: 42.8 % (ref 39.0–52.0)
Hemoglobin: 15 g/dL (ref 13.0–17.0)
Immature Granulocytes: 0 %
Lymphocytes Relative: 17 %
Lymphs Abs: 0.9 10*3/uL (ref 0.7–4.0)
MCH: 32.4 pg (ref 26.0–34.0)
MCHC: 35 g/dL (ref 30.0–36.0)
MCV: 92.4 fL (ref 80.0–100.0)
Monocytes Absolute: 0.6 10*3/uL (ref 0.1–1.0)
Monocytes Relative: 11 %
Neutro Abs: 3.4 10*3/uL (ref 1.7–7.7)
Neutrophils Relative %: 69 %
Platelet Count: 180 10*3/uL (ref 150–400)
RBC: 4.63 MIL/uL (ref 4.22–5.81)
RDW: 12.6 % (ref 11.5–15.5)
WBC Count: 5 10*3/uL (ref 4.0–10.5)
nRBC: 0 % (ref 0.0–0.2)

## 2023-07-14 LAB — CMP (CANCER CENTER ONLY)
ALT: 11 U/L (ref 0–44)
AST: 15 U/L (ref 15–41)
Albumin: 4.2 g/dL (ref 3.5–5.0)
Alkaline Phosphatase: 82 U/L (ref 38–126)
Anion gap: 6 (ref 5–15)
BUN: 10 mg/dL (ref 8–23)
CO2: 27 mmol/L (ref 22–32)
Calcium: 9.6 mg/dL (ref 8.9–10.3)
Chloride: 100 mmol/L (ref 98–111)
Creatinine: 0.91 mg/dL (ref 0.61–1.24)
GFR, Estimated: >60 mL/min (ref >60)
Glucose, Bld: 95 mg/dL (ref 70–99)
Potassium: 4.1 mmol/L (ref 3.5–5.1)
Sodium: 133 mmol/L — ABNORMAL LOW (ref 135–145)
Total Bilirubin: 0.8 mg/dL (ref 0.0–1.2)
Total Protein: 6.6 g/dL (ref 6.5–8.1)

## 2023-07-14 MED ORDER — IOHEXOL 300 MG/ML  SOLN
75.0000 mL | Freq: Once | INTRAMUSCULAR | Status: AC | PRN
  Administered 2023-07-14: 75 mL via INTRAVENOUS

## 2023-07-21 ENCOUNTER — Inpatient Hospital Stay: Payer: PPO | Admitting: Internal Medicine

## 2023-07-21 VITALS — BP 105/71 | HR 66 | Temp 97.6°F | Resp 16 | Ht 67.0 in | Wt 158.4 lb

## 2023-07-21 DIAGNOSIS — C3490 Malignant neoplasm of unspecified part of unspecified bronchus or lung: Secondary | ICD-10-CM | POA: Diagnosis not present

## 2023-07-21 DIAGNOSIS — C3412 Malignant neoplasm of upper lobe, left bronchus or lung: Secondary | ICD-10-CM | POA: Diagnosis not present

## 2023-07-21 NOTE — Progress Notes (Unsigned)
[facility] Telephone:(336) [phone]   Fax:(336) [phone]  OFFICE PROGRESS NOTE  Carylon Perches, MD [address] [place] [address]  DIAGNOSIS: Limited stage (T1c, N2, M0) small cell lung cancer presented with left upper lobe lung mass in addition to left hilar and mediastinal lymphadenopathy diagnosed in April 2023.  PRIOR THERAPY:  1) Systemic chemotherapy with cisplatin 75 Mg/M2 on day 1 and 2 etoposide 100 Mg/M2 on days 1, 2 and 3 every 3 weeks.  First cycle 11/10/2021.  This is concurrent with radiotherapy.  Status post 4 cycles. 2) prophylactic cranial irradiation under the care of Dr. Roselind Messier completed on 03/13/2022.  CURRENT THERAPY: Observation  INTERVAL HISTORY: JOSHAWA LAPIETRA 68 y.o. male returns to the clinic today for 4 months follow-up visit. Discussed the use of AI scribe software for clinical note transcription with the patient, who gave verbal consent to proceed.  History of Present Illness   The patient, a 68 year old with a history of small cell lung cancer, underwent four cycles of chemotherapy with cisplatin and etoposide every three weeks, followed by prophylactic cranial irradiation, which was completed in September 2023. Since then, the patient has been under observation for over a year.  In the past four months, the patient reports no new health issues but expresses dissatisfaction with his strength, feeling weak despite regular gym visits. However, the patient's recent chest scan showed no evidence of recurrent or metastatic disease, and post-radiation changes were similar to previous scans.  The patient also reports a concern about his left arm, which has been bothering him. The patient plans to address this issue during an upcoming physical examination in three weeks. Despite these concerns, the patient's appetite remains good.       MEDICAL HISTORY: Past Medical History:  Diagnosis Date   Arthritis    Cancer (HCC)    COPD  (chronic obstructive pulmonary disease) (HCC)    History of radiation therapy    Left Lung- 11/25/21-01/06/22- Dr. Antony Blackbird   History of radiation therapy    Brain- 02/26/22-03/13/22- Dr. Antony Blackbird    ALLERGIES:  is allergic to penicillamine, penicillins, and arava [leflunomide].  MEDICATIONS:  Current Outpatient Medications  Medication Sig Dispense Refill   methotrexate 50 MG/2ML injection Inject 15 mg into the vein every Wednesday. (Patient not taking: Reported on 04/16/2022)     No current facility-administered medications for this visit.    SURGICAL HISTORY:  Past Surgical History:  Procedure Laterality Date   MEDIASTINOSCOPY N/A 10/20/2021   Procedure: MEDIASTINOSCOPY;  Surgeon: Loreli Slot, MD;  Location: [facility] OR;  Service: Thoracic;  Laterality: N/A;   MICROLARYNGOSCOPY W/VOCAL CORD INJECTION Left 04/03/2022   Procedure: MICROLARYNGOSCOPY WITH MEDIALIZATION OF LEFT VOCAL FOLD WITH PROLARYN;  Surgeon: Laren Boom, DO;  Location: [facility] OR;  Service: ENT;  Laterality: Left;   ROTATOR CUFF REPAIR Left    VIDEO BRONCHOSCOPY WITH ENDOBRONCHIAL ULTRASOUND N/A 10/20/2021   Procedure: VIDEO BRONCHOSCOPY WITH ENDOBRONCHIAL ULTRASOUND WITH BIOPSIES;  Surgeon: Loreli Slot, MD;  Location: [facility] OR;  Service: Thoracic;  Laterality: N/A;    REVIEW OF SYSTEMS:  A comprehensive review of systems was negative.   PHYSICAL EXAMINATION: General appearance: alert, cooperative, and no distress Head: Normocephalic, without obvious abnormality, atraumatic Neck: no adenopathy, no JVD, supple, symmetrical, trachea midline, and thyroid not enlarged, symmetric, no tenderness/mass/nodules Lymph nodes: Cervical, supraclavicular, and axillary nodes normal. Resp: clear to auscultation bilaterally Back: symmetric, no curvature. ROM normal. No CVA tenderness.  Cardio: regular rate and rhythm, S1, S2 normal, no murmur, click, rub or gallop GI: soft, non-tender; bowel sounds normal; no  masses,  no organomegaly Extremities: extremities normal, atraumatic, no cyanosis or edema  ECOG PERFORMANCE STATUS: 1 - Symptomatic but completely ambulatory  Blood pressure 105/71, pulse 66, temperature 97.6 F (36.4 C), temperature source Temporal, resp. rate 16, height 5\' 7"  (1.702 m), weight 158 lb 6.4 oz (71.8 kg), SpO2 100%.  LABORATORY DATA: Lab Results  Component Value Date   WBC 5.0 07/14/2023   HGB 15.0 07/14/2023   HCT 42.8 07/14/2023   MCV 92.4 07/14/2023   PLT 180 07/14/2023      Chemistry      Component Value Date/Time   NA 133 (L) 07/14/2023 0907   K 4.1 07/14/2023 0907   CL 100 07/14/2023 0907   CO2 27 07/14/2023 0907   BUN 10 07/14/2023 0907   CREATININE 0.91 07/14/2023 0907      Component Value Date/Time   CALCIUM 9.6 07/14/2023 0907   ALKPHOS 82 07/14/2023 0907   AST 15 07/14/2023 0907   ALT 11 07/14/2023 0907   BILITOT 0.8 07/14/2023 0907       RADIOGRAPHIC STUDIES: CT Chest W Contrast Result Date: 07/20/2023 CLINICAL DATA:  Small-cell lung cancer of left upper lobe. Evaluate treatment response. * Tracking Code: BO * EXAM: CT CHEST WITH CONTRAST TECHNIQUE: Multidetector CT imaging of the chest was performed during intravenous contrast administration. RADIATION DOSE REDUCTION: This exam was performed according to the departmental dose-optimization program which includes automated exposure control, adjustment of the mA and/or kV according to patient size and/or use of iterative reconstruction technique. CONTRAST:  75mL OMNIPAQUE IOHEXOL 300 MG/ML  SOLN COMPARISON:  03/15/2023 FINDINGS: Cardiovascular: Aortic atherosclerosis. Normal heart size, without pericardial effusion. Lad coronary artery calcification. No central pulmonary embolism, on this non-dedicated study. Mediastinum/Nodes: No supraclavicular adenopathy. No mediastinal or hilar adenopathy. Lungs/Pleura: No pleural fluid. Advanced centrilobular and paraseptal emphysema. Tiny right middle lobe  nodule of 2 mm is similar on 132/8. Right lower lobe dependent scarring. Anterior left upper lobe interstitial thickening is similar on 74/8, favoring postinfectious/inflammatory scarring. 1-2 mm left lower lobe pulmonary nodule is similar on 87/8 and may represent a calcified granuloma. Calcified lingular granuloma 4-5 mm. Upper Abdomen: Normal imaged portions of the liver, spleen, stomach, gallbladder, pancreas, adrenal glands, right kidney. Upper pole left renal 2.9 cm cyst. A more posterior too small to characterize lesion is also most likely a cyst . In the absence of clinically indicated signs/symptoms require(s) no independent follow-up. Musculoskeletal: Mild accentuation of expected thoracic kyphosis. IMPRESSION: 1.  No acute process or evidence of metastatic disease in the chest. 2. Similar appearance of anterior left upper lobe presumed postinfectious/inflammatory interstitial thickening. 3. Aortic atherosclerosis (ICD10-I70.0), coronary artery atherosclerosis and emphysema (ICD10-J43.9). Electronically Signed   By: Jeronimo Greaves M.D.   On: 07/20/2023 16:27    ASSESSMENT AND PLAN: This is a very pleasant 68 years old white male recently diagnosed with limited stage (T1c, N2, M0) small cell lung cancer presented with left upper lobe lung mass in addition to left hilar and mediastinal lymphadenopathy diagnosed in April 2023. The patient underwent systemic chemotherapy with cisplatin 75 Mg/M2 on day 1 and oh etoposide 100 Mg/M2 on days 1, 2 and 3 every 3 weeks.  Status post 4 cycles.  This was concurrent with radiotherapy followed by prophylactic cranial irradiation. The patient is currently on observation and he is feeling fine. He had repeat CT scan of  the chest performed recently.  I personally independently reviewed the scan and discussed the result with the patient today. His scan showed no concerning findings for disease recurrence or metastasis. Assessment and Plan    Small Cell Lung Cancer  (SCLC) 68 year old with SCLC diagnosed April 2023. Completed four cycles of cisplatin and etoposide, followed by prophylactic cranial irradiation in September 2023. Recent chest scans show no recurrent or metastatic disease; post-radiation changes are well-managed. Reports persistent post-treatment weakness. Prefers follow-up every four months, with potential extension to six months after next visit. - Continue observation - Encourage physical activity as tolerated - Schedule follow-up visit in four months  Left Arm Pain Persistent left arm pain. Recent chest scan did not include the arm. Scheduled for physical examination in three weeks for further evaluation and potential imaging. - Evaluate left arm pain during upcoming physical examination - Consider imaging (e.g., X-ray) if pain persists or worsens  General Health Maintenance Maintaining good appetite and overall well-being. Encouraged to continue physical activities and attend regular health check-ups. - Encourage regular physical activity - Maintain regular health check-ups  Follow-up - Schedule follow-up visit in four months - Consider extending follow-up interval to six months after the next visit.   He was advised to call immediately if he has any other concerning symptoms in the interval.   The patient voices understanding of current disease status and treatment options and is in agreement with the current care plan.  All questions were answered. The patient knows to call the clinic with any problems, questions or concerns. We can certainly see the patient much sooner if necessary.  The total time spent in the appointment was 20 minutes.  Disclaimer: This note was dictated with voice recognition software. Similar sounding words can inadvertently be transcribed and may not be corrected upon review.

## 2023-08-06 DIAGNOSIS — C3490 Malignant neoplasm of unspecified part of unspecified bronchus or lung: Secondary | ICD-10-CM | POA: Diagnosis not present

## 2023-08-06 DIAGNOSIS — M069 Rheumatoid arthritis, unspecified: Secondary | ICD-10-CM | POA: Diagnosis not present

## 2023-08-06 DIAGNOSIS — I700 Atherosclerosis of aorta: Secondary | ICD-10-CM | POA: Diagnosis not present

## 2023-08-06 DIAGNOSIS — Z125 Encounter for screening for malignant neoplasm of prostate: Secondary | ICD-10-CM | POA: Diagnosis not present

## 2023-08-06 DIAGNOSIS — Z79899 Other long term (current) drug therapy: Secondary | ICD-10-CM | POA: Diagnosis not present

## 2023-08-17 DIAGNOSIS — Z85110 Personal history of malignant carcinoid tumor of bronchus and lung: Secondary | ICD-10-CM | POA: Diagnosis not present

## 2023-08-17 DIAGNOSIS — M069 Rheumatoid arthritis, unspecified: Secondary | ICD-10-CM | POA: Diagnosis not present

## 2023-08-17 DIAGNOSIS — J430 Unilateral pulmonary emphysema [MacLeod's syndrome]: Secondary | ICD-10-CM | POA: Diagnosis not present

## 2023-08-17 DIAGNOSIS — Z0001 Encounter for general adult medical examination with abnormal findings: Secondary | ICD-10-CM | POA: Diagnosis not present

## 2023-08-17 DIAGNOSIS — I700 Atherosclerosis of aorta: Secondary | ICD-10-CM | POA: Diagnosis not present

## 2023-08-17 DIAGNOSIS — M7532 Calcific tendinitis of left shoulder: Secondary | ICD-10-CM | POA: Diagnosis not present

## 2023-08-23 ENCOUNTER — Other Ambulatory Visit (HOSPITAL_COMMUNITY): Payer: Self-pay | Admitting: Sports Medicine

## 2023-08-23 DIAGNOSIS — M25512 Pain in left shoulder: Secondary | ICD-10-CM

## 2023-08-23 DIAGNOSIS — S46012D Strain of muscle(s) and tendon(s) of the rotator cuff of left shoulder, subsequent encounter: Secondary | ICD-10-CM | POA: Diagnosis not present

## 2023-08-24 ENCOUNTER — Encounter (INDEPENDENT_AMBULATORY_CARE_PROVIDER_SITE_OTHER): Payer: Self-pay | Admitting: *Deleted

## 2023-08-30 ENCOUNTER — Ambulatory Visit (HOSPITAL_COMMUNITY)
Admission: RE | Admit: 2023-08-30 | Discharge: 2023-08-30 | Disposition: A | Discharge status: Home / Self Care | Payer: PPO | Source: Ambulatory Visit | Attending: Sports Medicine | Admitting: Sports Medicine

## 2023-08-30 DIAGNOSIS — M25512 Pain in left shoulder: Secondary | ICD-10-CM | POA: Insufficient documentation

## 2023-08-30 DIAGNOSIS — S43432A Superior glenoid labrum lesion of left shoulder, initial encounter: Secondary | ICD-10-CM | POA: Diagnosis not present

## 2023-08-30 DIAGNOSIS — M75122 Complete rotator cuff tear or rupture of left shoulder, not specified as traumatic: Secondary | ICD-10-CM | POA: Diagnosis not present

## 2023-08-30 DIAGNOSIS — M129 Arthropathy, unspecified: Secondary | ICD-10-CM | POA: Diagnosis not present

## 2023-08-30 DIAGNOSIS — M7582 Other shoulder lesions, left shoulder: Secondary | ICD-10-CM | POA: Diagnosis not present

## 2023-09-13 DIAGNOSIS — S46012D Strain of muscle(s) and tendon(s) of the rotator cuff of left shoulder, subsequent encounter: Secondary | ICD-10-CM | POA: Diagnosis not present

## 2023-09-21 DIAGNOSIS — S46012A Strain of muscle(s) and tendon(s) of the rotator cuff of left shoulder, initial encounter: Secondary | ICD-10-CM | POA: Diagnosis not present

## 2023-09-22 ENCOUNTER — Telehealth: Payer: Self-pay | Admitting: *Deleted

## 2023-09-22 NOTE — Telephone Encounter (Signed)
  Procedure: colonoscopy  Height:  5'7" Weight: 155 lb      Have you had a colonoscopy before?  Pt states has been 10+ years unsure where he had it done.  Do you have family history of colon cancer?  no  Do you have a family history of polyps? NO  Previous colonoscopy with polyps removed? NO  Do you have a history colorectal cancer?   NO  Are you diabetic?  NO  Do you have a prosthetic or mechanical heart valve? NO  Do you have a pacemaker/defibrillator?   NO  Have you had endocarditis/atrial fibrillation?  NO  Do you use supplemental oxygen/CPAP?  NO  Have you had joint replacement within the last 12 months?  NO  Do you tend to be constipated or have to use laxatives?  NO   Do you have history of alcohol use? If yes, how much and how often.  NO  Do you have history or are you using drugs? If yes, what do are you  using?  NO  Have you ever had a stroke/heart attack?  NO  Have you ever had a heart or other vascular stent placed,?NO  Do you take weight loss medication? NO  Do you take any blood-thinning medications such as: (Plavix, aspirin, Coumadin, Aggrenox, Brilinta, Xarelto, Eliquis, Pradaxa, Savaysa or Effient)? NO  If yes we need the name, milligram, dosage and who is prescribing doctor:  N/A             Current Outpatient Medications  Medication Sig Dispense Refill   methotrexate 50 MG/2ML injection Inject 15 mg into the vein every Wednesday.     folic acid (FOLVITE) 1 MG tablet Take 1 mg by mouth daily.     No current facility-administered medications for this visit.    Allergies  Allergen Reactions   Penicillamine Other (See Comments)    Unknown to patient    Penicillins     Unknown childhood allergy    Arava [Leflunomide] Diarrhea    Intolerance

## 2023-09-23 DIAGNOSIS — M1991 Primary osteoarthritis, unspecified site: Secondary | ICD-10-CM | POA: Diagnosis not present

## 2023-09-23 DIAGNOSIS — M25512 Pain in left shoulder: Secondary | ICD-10-CM | POA: Diagnosis not present

## 2023-09-23 DIAGNOSIS — K219 Gastro-esophageal reflux disease without esophagitis: Secondary | ICD-10-CM | POA: Diagnosis not present

## 2023-09-23 DIAGNOSIS — Z6823 Body mass index (BMI) 23.0-23.9, adult: Secondary | ICD-10-CM | POA: Diagnosis not present

## 2023-09-23 DIAGNOSIS — C3490 Malignant neoplasm of unspecified part of unspecified bronchus or lung: Secondary | ICD-10-CM | POA: Diagnosis not present

## 2023-09-23 DIAGNOSIS — M0579 Rheumatoid arthritis with rheumatoid factor of multiple sites without organ or systems involvement: Secondary | ICD-10-CM | POA: Diagnosis not present

## 2023-09-23 DIAGNOSIS — M25511 Pain in right shoulder: Secondary | ICD-10-CM | POA: Diagnosis not present

## 2023-09-23 DIAGNOSIS — M5030 Other cervical disc degeneration, unspecified cervical region: Secondary | ICD-10-CM | POA: Diagnosis not present

## 2023-10-05 DIAGNOSIS — H6504 Acute serous otitis media, recurrent, right ear: Secondary | ICD-10-CM | POA: Diagnosis not present

## 2023-10-05 DIAGNOSIS — H6991 Unspecified Eustachian tube disorder, right ear: Secondary | ICD-10-CM | POA: Diagnosis not present

## 2023-10-05 DIAGNOSIS — H903 Sensorineural hearing loss, bilateral: Secondary | ICD-10-CM | POA: Diagnosis not present

## 2023-10-06 DIAGNOSIS — S46012A Strain of muscle(s) and tendon(s) of the rotator cuff of left shoulder, initial encounter: Secondary | ICD-10-CM | POA: Diagnosis not present

## 2023-10-06 DIAGNOSIS — M75122 Complete rotator cuff tear or rupture of left shoulder, not specified as traumatic: Secondary | ICD-10-CM | POA: Diagnosis not present

## 2023-10-06 DIAGNOSIS — M7582 Other shoulder lesions, left shoulder: Secondary | ICD-10-CM | POA: Diagnosis not present

## 2023-10-06 DIAGNOSIS — Y999 Unspecified external cause status: Secondary | ICD-10-CM | POA: Diagnosis not present

## 2023-10-06 DIAGNOSIS — M7522 Bicipital tendinitis, left shoulder: Secondary | ICD-10-CM | POA: Diagnosis not present

## 2023-10-06 DIAGNOSIS — S46112A Strain of muscle, fascia and tendon of long head of biceps, left arm, initial encounter: Secondary | ICD-10-CM | POA: Diagnosis not present

## 2023-10-06 DIAGNOSIS — M25812 Other specified joint disorders, left shoulder: Secondary | ICD-10-CM | POA: Diagnosis not present

## 2023-10-06 DIAGNOSIS — M7542 Impingement syndrome of left shoulder: Secondary | ICD-10-CM | POA: Diagnosis not present

## 2023-10-06 DIAGNOSIS — G8918 Other acute postprocedural pain: Secondary | ICD-10-CM | POA: Diagnosis not present

## 2023-10-06 DIAGNOSIS — M19012 Primary osteoarthritis, left shoulder: Secondary | ICD-10-CM | POA: Diagnosis not present

## 2023-10-06 DIAGNOSIS — M24112 Other articular cartilage disorders, left shoulder: Secondary | ICD-10-CM | POA: Diagnosis not present

## 2023-10-06 DIAGNOSIS — X58XXXA Exposure to other specified factors, initial encounter: Secondary | ICD-10-CM | POA: Diagnosis not present

## 2023-10-12 DIAGNOSIS — M19012 Primary osteoarthritis, left shoulder: Secondary | ICD-10-CM | POA: Diagnosis not present

## 2023-10-13 DIAGNOSIS — M25512 Pain in left shoulder: Secondary | ICD-10-CM | POA: Diagnosis not present

## 2023-10-14 DIAGNOSIS — R042 Hemoptysis: Secondary | ICD-10-CM | POA: Diagnosis not present

## 2023-10-18 DIAGNOSIS — M25512 Pain in left shoulder: Secondary | ICD-10-CM | POA: Diagnosis not present

## 2023-10-20 DIAGNOSIS — M25512 Pain in left shoulder: Secondary | ICD-10-CM | POA: Diagnosis not present

## 2023-10-26 DIAGNOSIS — M25512 Pain in left shoulder: Secondary | ICD-10-CM | POA: Diagnosis not present

## 2023-10-28 DIAGNOSIS — M25512 Pain in left shoulder: Secondary | ICD-10-CM | POA: Diagnosis not present

## 2023-10-29 NOTE — Telephone Encounter (Signed)
Ok to schedule. ASA 3.  ? ?

## 2023-11-01 DIAGNOSIS — M25512 Pain in left shoulder: Secondary | ICD-10-CM | POA: Diagnosis not present

## 2023-11-01 NOTE — Telephone Encounter (Signed)
 LMOVM to call back

## 2023-11-04 ENCOUNTER — Encounter: Payer: Self-pay | Admitting: *Deleted

## 2023-11-04 ENCOUNTER — Other Ambulatory Visit: Payer: Self-pay | Admitting: *Deleted

## 2023-11-04 ENCOUNTER — Encounter (INDEPENDENT_AMBULATORY_CARE_PROVIDER_SITE_OTHER): Payer: Self-pay | Admitting: *Deleted

## 2023-11-04 DIAGNOSIS — M25512 Pain in left shoulder: Secondary | ICD-10-CM | POA: Diagnosis not present

## 2023-11-04 MED ORDER — PEG 3350-KCL-NA BICARB-NACL 420 G PO SOLR: 4000.0000 mL | Freq: Once | ORAL | 0 refills | Status: AC

## 2023-11-04 NOTE — Telephone Encounter (Signed)
 Referral completed, [facility] apt letter sent to PCP

## 2023-11-04 NOTE — Telephone Encounter (Signed)
 LMOVM to return call  Pt left vm returning call

## 2023-11-04 NOTE — Telephone Encounter (Signed)
 Pt has been scheduled for 12/15/23 with Dr.Carver. instructions mailed and prep sent to the pharmacy.

## 2023-11-08 DIAGNOSIS — M25512 Pain in left shoulder: Secondary | ICD-10-CM | POA: Diagnosis not present

## 2023-11-09 ENCOUNTER — Ambulatory Visit (HOSPITAL_COMMUNITY)
Admission: RE | Admit: 2023-11-09 | Discharge: 2023-11-09 | Disposition: A | Discharge status: Home / Self Care | Payer: PPO | Source: Ambulatory Visit | Attending: Internal Medicine | Admitting: Internal Medicine

## 2023-11-09 ENCOUNTER — Inpatient Hospital Stay: Payer: PPO | Attending: Internal Medicine

## 2023-11-09 DIAGNOSIS — I700 Atherosclerosis of aorta: Secondary | ICD-10-CM | POA: Diagnosis not present

## 2023-11-09 DIAGNOSIS — R918 Other nonspecific abnormal finding of lung field: Secondary | ICD-10-CM | POA: Insufficient documentation

## 2023-11-09 DIAGNOSIS — Z923 Personal history of irradiation: Secondary | ICD-10-CM | POA: Insufficient documentation

## 2023-11-09 DIAGNOSIS — J439 Emphysema, unspecified: Secondary | ICD-10-CM | POA: Diagnosis not present

## 2023-11-09 DIAGNOSIS — C3490 Malignant neoplasm of unspecified part of unspecified bronchus or lung: Secondary | ICD-10-CM | POA: Diagnosis not present

## 2023-11-09 DIAGNOSIS — Z85118 Personal history of other malignant neoplasm of bronchus and lung: Secondary | ICD-10-CM | POA: Diagnosis not present

## 2023-11-09 DIAGNOSIS — R296 Repeated falls: Secondary | ICD-10-CM | POA: Diagnosis not present

## 2023-11-09 DIAGNOSIS — Z9221 Personal history of antineoplastic chemotherapy: Secondary | ICD-10-CM | POA: Diagnosis not present

## 2023-11-09 LAB — CBC WITH DIFFERENTIAL (CANCER CENTER ONLY)
Abs Immature Granulocytes: 0.03 10*3/uL (ref 0.00–0.07)
Basophils Absolute: 0.1 10*3/uL (ref 0.0–0.1)
Basophils Relative: 1 %
Eosinophils Absolute: 0.1 10*3/uL (ref 0.0–0.5)
Eosinophils Relative: 2 %
HCT: 44.2 % (ref 39.0–52.0)
Hemoglobin: 15.2 g/dL (ref 13.0–17.0)
Immature Granulocytes: 0 %
Lymphocytes Relative: 14 %
Lymphs Abs: 1 10*3/uL (ref 0.7–4.0)
MCH: 30.8 pg (ref 26.0–34.0)
MCHC: 34.4 g/dL (ref 30.0–36.0)
MCV: 89.5 fL (ref 80.0–100.0)
Monocytes Absolute: 0.5 10*3/uL (ref 0.1–1.0)
Monocytes Relative: 7 %
Neutro Abs: 5.3 10*3/uL (ref 1.7–7.7)
Neutrophils Relative %: 76 %
Platelet Count: 195 10*3/uL (ref 150–400)
RBC: 4.94 MIL/uL (ref 4.22–5.81)
RDW: 13.3 % (ref 11.5–15.5)
WBC Count: 7 10*3/uL (ref 4.0–10.5)
nRBC: 0 % (ref 0.0–0.2)

## 2023-11-09 LAB — CMP (CANCER CENTER ONLY)
ALT: 10 U/L (ref 0–44)
AST: 12 U/L — ABNORMAL LOW (ref 15–41)
Albumin: 4.2 g/dL (ref 3.5–5.0)
Alkaline Phosphatase: 76 U/L (ref 38–126)
Anion gap: 5 (ref 5–15)
BUN: 9 mg/dL (ref 8–23)
CO2: 30 mmol/L (ref 22–32)
Calcium: 9.3 mg/dL (ref 8.9–10.3)
Chloride: 99 mmol/L (ref 98–111)
Creatinine: 0.87 mg/dL (ref 0.61–1.24)
GFR, Estimated: >60 mL/min (ref >60)
Glucose, Bld: 89 mg/dL (ref 70–99)
Potassium: 4.2 mmol/L (ref 3.5–5.1)
Sodium: 134 mmol/L — ABNORMAL LOW (ref 135–145)
Total Bilirubin: 0.8 mg/dL (ref 0.0–1.2)
Total Protein: 6.6 g/dL (ref 6.5–8.1)

## 2023-11-09 MED ORDER — SODIUM CHLORIDE (PF) 0.9 % IJ SOLN
INTRAMUSCULAR | Status: AC
  Filled 2023-11-09: qty 50

## 2023-11-09 MED ORDER — IOHEXOL 300 MG/ML  SOLN
75.0000 mL | Freq: Once | INTRAMUSCULAR | Status: AC | PRN
  Administered 2023-11-09: 75 mL via INTRAVENOUS

## 2023-11-10 DIAGNOSIS — M25512 Pain in left shoulder: Secondary | ICD-10-CM | POA: Diagnosis not present

## 2023-11-15 DIAGNOSIS — M25512 Pain in left shoulder: Secondary | ICD-10-CM | POA: Diagnosis not present

## 2023-11-16 ENCOUNTER — Telehealth: Payer: Self-pay

## 2023-11-16 ENCOUNTER — Inpatient Hospital Stay: Payer: PPO | Admitting: Internal Medicine

## 2023-11-16 VITALS — BP 110/77 | HR 60 | Temp 98.0°F | Resp 17 | Ht 67.0 in | Wt 154.5 lb

## 2023-11-16 DIAGNOSIS — C3490 Malignant neoplasm of unspecified part of unspecified bronchus or lung: Secondary | ICD-10-CM | POA: Diagnosis not present

## 2023-11-16 DIAGNOSIS — R918 Other nonspecific abnormal finding of lung field: Secondary | ICD-10-CM | POA: Diagnosis not present

## 2023-11-16 NOTE — Telephone Encounter (Signed)
 Received voicemail from patient regarding follow-up appointment with Dr. Marguerita Shih after Brain MRI on 11/23/23. Information will be relayed to Dr. Marguerita Shih for review, and a call will be made to the patient with recommendations.

## 2023-11-16 NOTE — Progress Notes (Signed)
 [facility] Telephone:([phone]) [phone]   Fax:(336) [phone]  OFFICE PROGRESS NOTE  Artemisa Bile, MD [address] [place] [address]  DIAGNOSIS: Limited stage (T1c, N2, M0) small cell lung cancer presented with left upper lobe lung mass in addition to left hilar and mediastinal lymphadenopathy diagnosed in April 2023.  PRIOR THERAPY:  1) Systemic chemotherapy with cisplatin  75 Mg/M2 on day 1 and 2 etoposide  100 Mg/M2 on days 1, 2 and 3 every 3 weeks.  First cycle 11/10/2021.  This is concurrent with radiotherapy.  Status post 4 cycles. 2) prophylactic cranial irradiation under the care of Dr. Eloise Hake completed on 03/13/2022.  CURRENT THERAPY: Observation  INTERVAL HISTORY: Jonathan Castro 68 y.o. male returns to the clinic today for 4 months follow-up visit. Discussed the use of AI scribe software for clinical note transcription with the patient, who gave verbal consent to proceed.  History of Present Illness   Jonathan Castro "Athena Bland" is a 68 year old male with limited stage small cell lung cancer who presents for evaluation and repeat CT scan of the chest for restaging of his disease.  Diagnosed with limited stage small cell lung cancer in April 2023, he underwent systemic chemotherapy with cisplatin  and etoposide  for four cycles, concurrent with radiation therapy, followed by prophylactic cranial irradiation. He has been on observation since September 2023. His last brain MRI was conducted on February 17, 2022, with no recent brain imaging since then.  He experiences significant balance issues over the past week, leading to three falls. He describes his balance as 'real bad' but does not experience dizziness or vertigo. He has difficulty maintaining balance, especially when getting up, and has been taking smaller steps to manage this. He notes improvement over the past week.  He has a history of weakness over the last six months, exacerbated by cold weather and a  COVID-19 infection. He also underwent rotator cuff surgery, which has limited his activity.  He recalls a fall where he hit his back on a table, resulting in minor bleeding for about three days. He is concerned about his teeth, noting that 'little bitty pieces are falling out,' which he attributes to the effects of chemotherapy and radiation.        MEDICAL HISTORY: Past Medical History:  Diagnosis Date   Arthritis    Cancer (HCC)    COPD (chronic obstructive pulmonary disease) (HCC)    History of radiation therapy    Left Lung- 11/25/21-01/06/22- Dr. Retta Caster   History of radiation therapy    Brain- 02/26/22-03/13/22- Dr. Retta Caster    ALLERGIES:  is allergic to penicillamine, penicillins, and arava [leflunomide].  MEDICATIONS:  Current Outpatient Medications  Medication Sig Dispense Refill   folic acid (FOLVITE) 1 MG tablet Take 1 mg by mouth daily.     methotrexate 50 MG/2ML injection Inject 15 mg into the vein every Wednesday.     No current facility-administered medications for this visit.    SURGICAL HISTORY:  Past Surgical History:  Procedure Laterality Date   MEDIASTINOSCOPY N/A 10/20/2021   Procedure: MEDIASTINOSCOPY;  Surgeon: Zelphia Higashi, MD;  Location: [facility] OR;  Service: Thoracic;  Laterality: N/A;   MICROLARYNGOSCOPY W/VOCAL CORD INJECTION Left 04/03/2022   Procedure: MICROLARYNGOSCOPY WITH MEDIALIZATION OF LEFT VOCAL FOLD WITH PROLARYN;  Surgeon: Daleen Dubs, DO;  Location: [facility] OR;  Service: ENT;  Laterality: Left;   ROTATOR CUFF REPAIR Left    VIDEO BRONCHOSCOPY WITH ENDOBRONCHIAL ULTRASOUND  N/A 10/20/2021   Procedure: VIDEO BRONCHOSCOPY WITH ENDOBRONCHIAL ULTRASOUND WITH BIOPSIES;  Surgeon: Zelphia Higashi, MD;  Location: [facility] OR;  Service: Thoracic;  Laterality: N/A;    REVIEW OF SYSTEMS:  Constitutional: positive for fatigue Eyes: negative Ears, nose, mouth, throat, and face: negative Respiratory: positive for cough Cardiovascular:  negative Gastrointestinal: negative Genitourinary:negative Integument/breast: negative Hematologic/lymphatic: negative Musculoskeletal:positive for arthralgias and muscle weakness Neurological: positive for coordination problems and weakness Behavioral/Psych: negative Endocrine: negative Allergic/Immunologic: negative   PHYSICAL EXAMINATION: General appearance: alert, cooperative, fatigued, and no distress Head: Normocephalic, without obvious abnormality, atraumatic Neck: no adenopathy, no JVD, supple, symmetrical, trachea midline, and thyroid  not enlarged, symmetric, no tenderness/mass/nodules Lymph nodes: Cervical, supraclavicular, and axillary nodes normal. Resp: clear to auscultation bilaterally Back: symmetric, no curvature. ROM normal. No CVA tenderness. Cardio: regular rate and rhythm, S1, S2 normal, no murmur, click, rub or gallop GI: soft, non-tender; bowel sounds normal; no masses,  no organomegaly Extremities: extremities normal, atraumatic, no cyanosis or edema Neurologic: Alert and oriented X 3, normal strength and tone. Normal symmetric reflexes. Normal coordination and gait  ECOG PERFORMANCE STATUS: 1 - Symptomatic but completely ambulatory  Blood pressure 110/77, pulse 60, temperature 98 F (36.7 C), temperature source Temporal, resp. rate 17, height 5\' 7"  (1.702 m), weight 154 lb 8 oz (70.1 kg), SpO2 99%.  LABORATORY DATA: Lab Results  Component Value Date   WBC 7.0 11/09/2023   HGB 15.2 11/09/2023   HCT 44.2 11/09/2023   MCV 89.5 11/09/2023   PLT 195 11/09/2023      Chemistry      Component Value Date/Time   NA 134 (L) 11/09/2023 0854   K 4.2 11/09/2023 0854   CL 99 11/09/2023 0854   CO2 30 11/09/2023 0854   BUN 9 11/09/2023 0854   CREATININE 0.87 11/09/2023 0854      Component Value Date/Time   CALCIUM 9.3 11/09/2023 0854   ALKPHOS 76 11/09/2023 0854   AST 12 (L) 11/09/2023 0854   ALT 10 11/09/2023 0854   BILITOT 0.8 11/09/2023 0854        RADIOGRAPHIC STUDIES: CT Chest W Contrast Result Date: 11/16/2023 CLINICAL DATA:  Restaging lung cancer.  * Tracking Code: BO * EXAM: CT CHEST WITH CONTRAST TECHNIQUE: Multidetector CT imaging of the chest was performed during intravenous contrast administration. RADIATION DOSE REDUCTION: This exam was performed according to the departmental dose-optimization program which includes automated exposure control, adjustment of the mA and/or kV according to patient size and/or use of iterative reconstruction technique. CONTRAST:  75mL OMNIPAQUE  IOHEXOL  300 MG/ML  SOLN COMPARISON:  07/14/2023. FINDINGS: Cardiovascular: Heart size appears normal. No significant pericardial effusion. Aortic atherosclerosis and coronary artery calcifications. Mediastinum/Nodes: Thyroid  gland, trachea and esophagus are normal. No enlarged mediastinal or hilar lymph nodes. Lungs/Pleura: Severe changes of emphysema with diffuse bronchial wall thickening. New focal area of subpleural new small focal area of nodular thickening within the posterolateral right lung base, image 103/8. This measures approximately 1.9 x 0.8 cm. Calcified granulomas noted within the left upper and left lower lobes. No suspicious nodule or mass identified within the left lung. Upper Abdomen: No acute abnormality. Upper pole left kidney cysts are again seen. The largest measures 3 cm, image 129/2. No specific follow-up imaging recommended. Adrenal glands appear well preserved. Small flash fill hemangioma is unchanged within the anterior dome of liver, image 115/2. Musculoskeletal: No acute or suspicious osseous abnormality. Thoracic degenerative disc disease. IMPRESSION: 1. No signs of recurrent or metastatic disease within the chest.  2. New focal area of subpleural nodular thickening within the posterolateral right lung base. This measures approximately 1.9 x 0.8 cm. This is nonspecific and is favored to represent an area of the postinflammatory change or  atelectasis. Attention on future surveillance imaging advised. 3. Coronary artery calcifications. 4. Aortic Atherosclerosis (ICD10-I70.0) and Emphysema (ICD10-J43.9). Electronically Signed   By: Kimberley Penman M.D.   On: 11/16/2023 05:50    ASSESSMENT AND PLAN: This is a very pleasant 68 years old white male recently diagnosed with limited stage (T1c, N2, M0) small cell lung cancer presented with left upper lobe lung mass in addition to left hilar and mediastinal lymphadenopathy diagnosed in April 2023. The patient underwent systemic chemotherapy with cisplatin  75 Mg/M2 on day 1 and oh etoposide  100 Mg/M2 on days 1, 2 and 3 every 3 weeks.  Status post 4 cycles.  This was concurrent with radiotherapy followed by prophylactic cranial irradiation. The patient is currently on observation and he is feeling fine except for the recent balance issues. He had repeat CT scan of the chest performed recently.  I personally and independently reviewed the scan images and discussed the result and showed the images to the patient today.  His scan showed no concerning findings for disease recurrence or metastasis but there was new focal area of subpleural lower nodular thickening in the posterior lateral right lung base that could be inflammatory in origin especially after his recent fall on that same side and coughing blood after the fall.     Limited stage small cell lung cancer Limited stage small cell lung cancer diagnosed in April 2023. Completed systemic chemotherapy with cisplatin  and etoposide  for four cycles concurrent with radiation, followed by prophylactic cranial irradiation. Currently under observation since September 2023. Recent chest CT scan shows an area of consolidation in the right lung, likely inflammation, not concerning for cancer recurrence. No recent brain MRI since August 2023. Due to balance issues, there is a concern for possible brain metastasis, as small cell lung cancer has a propensity to  metastasize to the brain. - Order MRI of the brain to evaluate for metastasis. - Schedule follow-up in six months with repeat chest CT scan.  Balance issues Balance issues with episodes of falling and difficulty maintaining balance. No recent dizziness or vertigo. Symptoms have improved over the past week. Given small cell lung cancer, there is a concern for possible brain metastasis contributing to these symptoms. MRI of the brain is warranted to rule out metastasis. - Order MRI of the brain to evaluate for possible metastasis.  Right lung inflammation Area of consolidation in the right lung observed on recent chest CT scan, likely representing inflammation. This is on the opposite side of the previous cancer site. No immediate concern for malignancy. - Monitor right lung consolidation; no immediate intervention required.  The patient was advised to call immediately if he has any other concerning symptoms in the interval.   The patient voices understanding of current disease status and treatment options and is in agreement with the current care plan.  All questions were answered. The patient knows to call the clinic with any problems, questions or concerns. We can certainly see the patient much sooner if necessary. The total time spent in the appointment was 30 minutes including review of chart and various tests results, discussions about plan of care and coordination of care plan .   Disclaimer: This note was dictated with voice recognition software. Similar sounding words can inadvertently be transcribed and may  not be corrected upon review.

## 2023-11-16 NOTE — Telephone Encounter (Signed)
 Pt called to cancel his procedure. He states that he is just not going to go through all the stuff we are wanting him to do. He does not wish to reschedule. FYI

## 2023-11-17 NOTE — Telephone Encounter (Signed)
 Tried to reach patient in regards to VM left.  Per Dr. Marguerita Shih- The patient only needs an appt if there is concerning findings on the MRI.   LVM for return call to relay the message to the patient.

## 2023-11-17 NOTE — Telephone Encounter (Signed)
 Referral has been closed

## 2023-11-17 NOTE — Telephone Encounter (Signed)
 Patient returned my phone call and informed patient of Dr. Bing Buff recommendations.  Patient voiced understanding.

## 2023-11-18 DIAGNOSIS — M25512 Pain in left shoulder: Secondary | ICD-10-CM | POA: Diagnosis not present

## 2023-11-23 ENCOUNTER — Ambulatory Visit (HOSPITAL_COMMUNITY)
Admission: RE | Admit: 2023-11-23 | Discharge: 2023-11-23 | Disposition: A | Discharge status: Home / Self Care | Source: Ambulatory Visit | Attending: Internal Medicine | Admitting: Internal Medicine

## 2023-11-23 DIAGNOSIS — I6782 Cerebral ischemia: Secondary | ICD-10-CM | POA: Diagnosis not present

## 2023-11-23 DIAGNOSIS — C3490 Malignant neoplasm of unspecified part of unspecified bronchus or lung: Secondary | ICD-10-CM | POA: Insufficient documentation

## 2023-11-23 DIAGNOSIS — M25512 Pain in left shoulder: Secondary | ICD-10-CM | POA: Diagnosis not present

## 2023-11-23 DIAGNOSIS — R9089 Other abnormal findings on diagnostic imaging of central nervous system: Secondary | ICD-10-CM | POA: Diagnosis not present

## 2023-11-23 MED ORDER — GADOBUTROL 1 MMOL/ML IV SOLN
7.0000 mL | Freq: Once | INTRAVENOUS | Status: AC | PRN
  Administered 2023-11-23: 7 mL via INTRAVENOUS

## 2023-11-24 DIAGNOSIS — H43813 Vitreous degeneration, bilateral: Secondary | ICD-10-CM | POA: Diagnosis not present

## 2023-11-24 DIAGNOSIS — H40013 Open angle with borderline findings, low risk, bilateral: Secondary | ICD-10-CM | POA: Diagnosis not present

## 2023-11-24 DIAGNOSIS — H2513 Age-related nuclear cataract, bilateral: Secondary | ICD-10-CM | POA: Diagnosis not present

## 2023-11-24 DIAGNOSIS — D3132 Benign neoplasm of left choroid: Secondary | ICD-10-CM | POA: Diagnosis not present

## 2023-11-25 DIAGNOSIS — M25512 Pain in left shoulder: Secondary | ICD-10-CM | POA: Diagnosis not present

## 2023-11-29 DIAGNOSIS — M25512 Pain in left shoulder: Secondary | ICD-10-CM | POA: Diagnosis not present

## 2023-12-01 DIAGNOSIS — M25512 Pain in left shoulder: Secondary | ICD-10-CM | POA: Diagnosis not present

## 2023-12-06 DIAGNOSIS — M25512 Pain in left shoulder: Secondary | ICD-10-CM | POA: Diagnosis not present

## 2023-12-08 DIAGNOSIS — M25512 Pain in left shoulder: Secondary | ICD-10-CM | POA: Diagnosis not present

## 2023-12-13 ENCOUNTER — Other Ambulatory Visit (HOSPITAL_COMMUNITY)

## 2023-12-13 DIAGNOSIS — M25512 Pain in left shoulder: Secondary | ICD-10-CM | POA: Diagnosis not present

## 2023-12-15 ENCOUNTER — Encounter (HOSPITAL_COMMUNITY): Payer: Self-pay

## 2023-12-15 ENCOUNTER — Ambulatory Visit (HOSPITAL_COMMUNITY): Admit: 2023-12-15 | Admitting: Internal Medicine

## 2023-12-15 DIAGNOSIS — M25512 Pain in left shoulder: Secondary | ICD-10-CM | POA: Diagnosis not present

## 2023-12-15 SURGERY — COLONOSCOPY
Anesthesia: Choice

## 2023-12-20 DIAGNOSIS — M25512 Pain in left shoulder: Secondary | ICD-10-CM | POA: Diagnosis not present

## 2023-12-22 DIAGNOSIS — M25512 Pain in left shoulder: Secondary | ICD-10-CM | POA: Diagnosis not present

## 2023-12-27 DIAGNOSIS — M25512 Pain in left shoulder: Secondary | ICD-10-CM | POA: Diagnosis not present

## 2024-01-17 IMAGING — CT CT CHEST LUNG CANCER SCREENING LOW DOSE W/O CM
1 series · 10 of 10 positions shown, 13 images · non-contrast
Comparison: No priors.

CLINICAL DATA: 66-year-old male current smoker with 40 pack-year
history of smoking. Lung cancer screening examination.



[ct lung segmentation data · axial · 0.66mm/px · z∈[+1393,+1393]mm · 10 of 327 frames shown]
[frame 1/327  mediastinal]
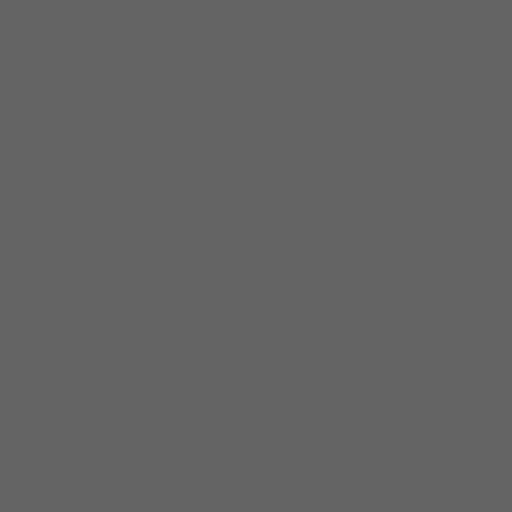
[frame 1/327  lung]
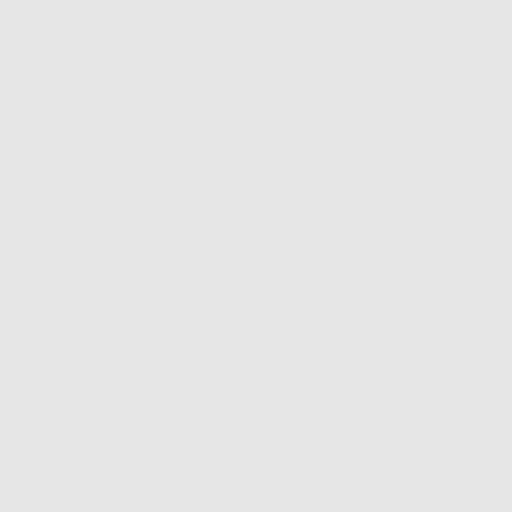
[frame 37/327  lung]
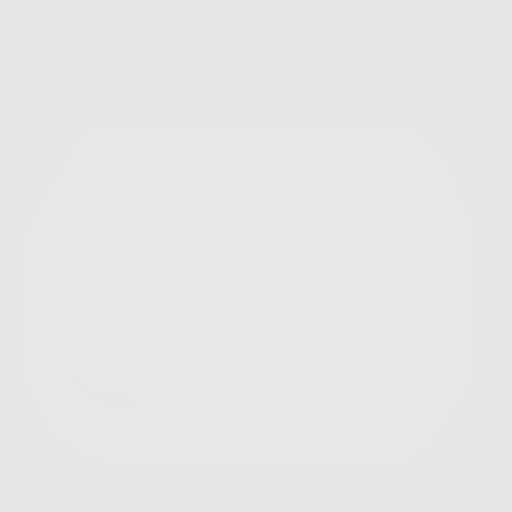
[frame 73/327  lung]
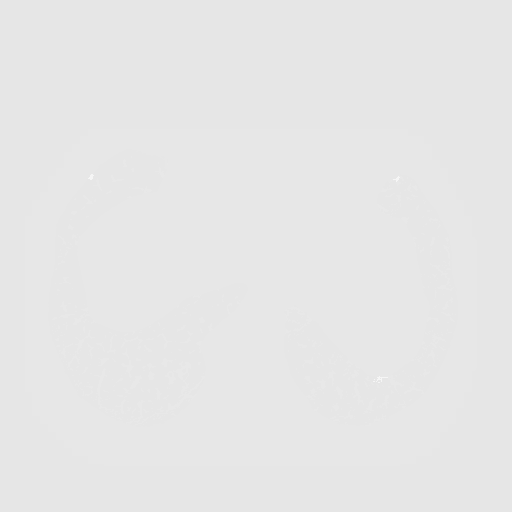
[frame 109/327  lung]
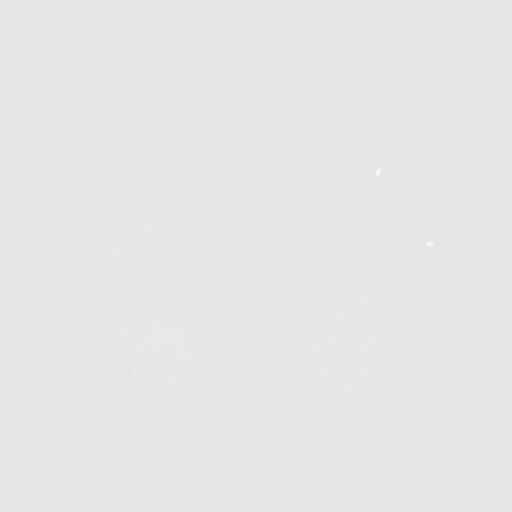
[frame 145/327  mediastinal]
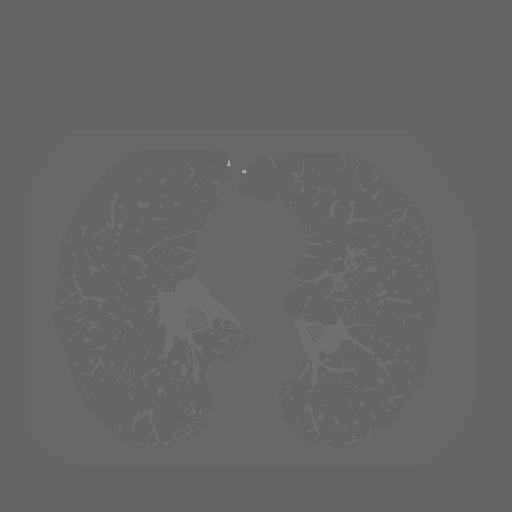
[frame 145/327  lung]
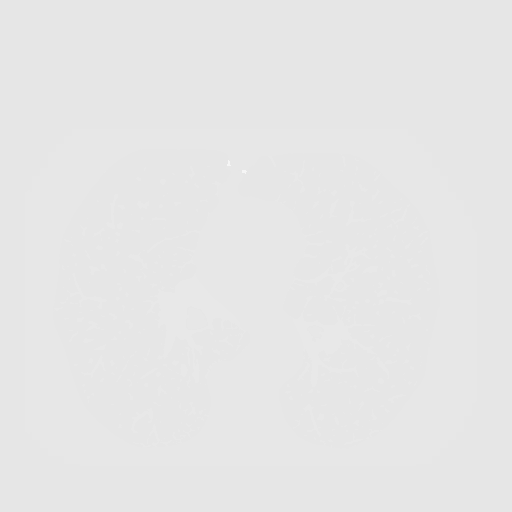
[frame 182/327  lung]
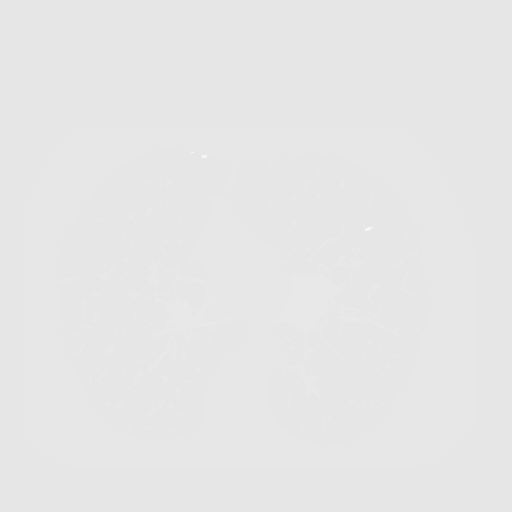
[frame 218/327  lung]
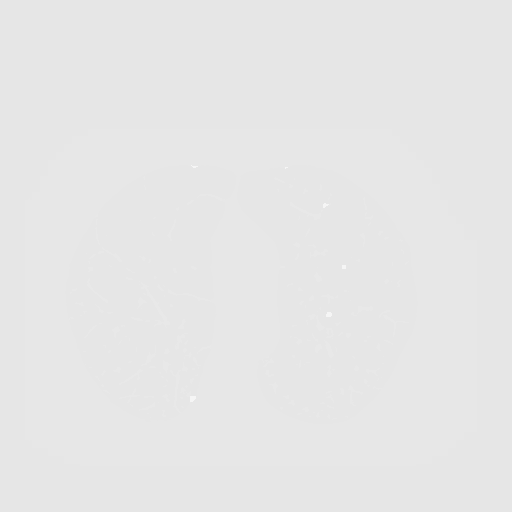
[frame 254/327  lung]
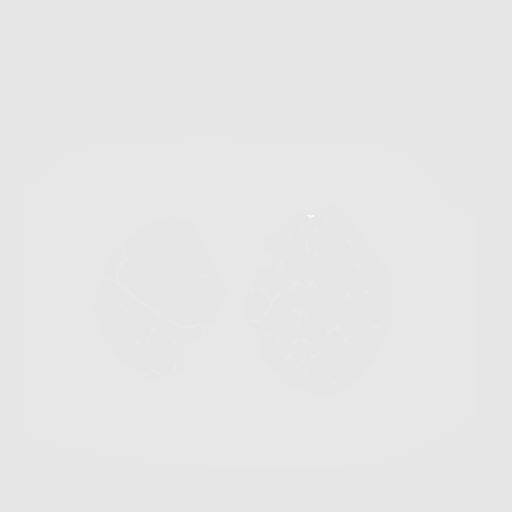
[frame 290/327  mediastinal]
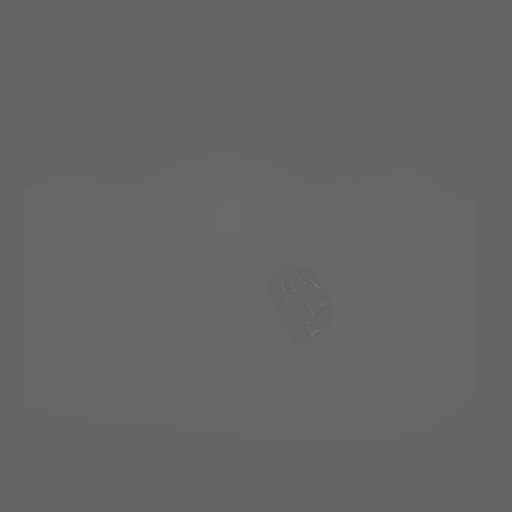
[frame 290/327  lung]
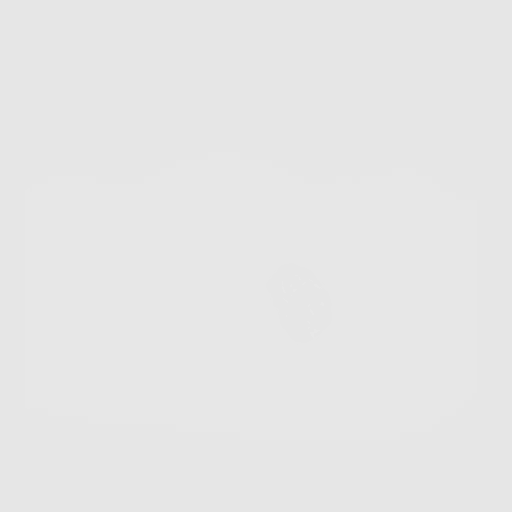
[frame 327/327  lung]
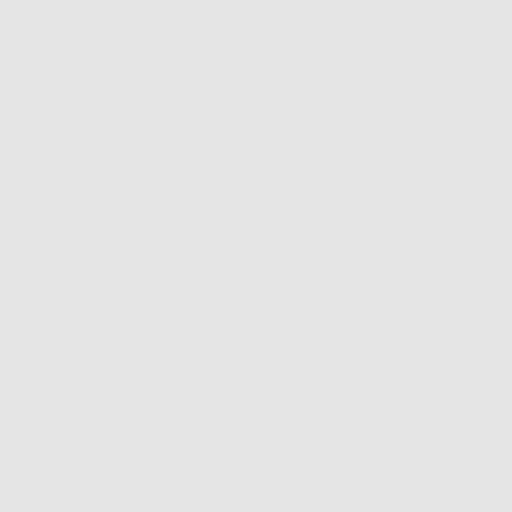

[10 of 10 positions shown; findings below may reference images not displayed]

FINDINGS: Cardiovascular: Heart size is normal. There is no significant
pericardial fluid, thickening or pericardial calcification. There is
aortic atherosclerosis, as well as atherosclerosis of the great
vessels of the mediastinum and the coronary arteries, including
calcified atherosclerotic plaque in the left main, left anterior
descending and left circumflex coronary arteries.

Mediastinum/Nodes: Multiple enlarged lymph nodes are noted, most
evident in the low left paratracheal nodal station (1.6 cm in short
axis), and in the AP window nodal station (2 cm in short axis), both
visualized on axial image 26 of series 2. Esophagus is unremarkable
in appearance. No axillary lymphadenopathy.

Lungs/Pleura: Large pulmonary nodule in the central aspect of the
left upper lobe (axial image 155 of series 3), with a volume derived
mean diameter estimated at 23.1 mm. No acute consolidative airspace
disease. No pleural effusions. Diffuse bronchial wall thickening
with severe centrilobular and paraseptal emphysema, including
bullous disease in the upper lobes of the lungs bilaterally (right
greater than left).

Upper Abdomen: Aortic atherosclerosis.

Musculoskeletal: There are no aggressive appearing lytic or blastic
lesions noted in the visualized portions of the skeleton.
IMPRESSION: 1. Large central left upper lobe pulmonary nodule with AP window and
left paratracheal lymphadenopathy, highly concerning for primary
lung cancer with nodal metastasis, categorized as Lung-RADS 4XS,
highly suspicious. Additional imaging evaluation or consultation
with Pulmonology or Thoracic Surgery recommended.
2. The "S" modifier above refers to potentially clinically
significant non lung cancer related findings. Specifically, there is
aortic atherosclerosis, in addition to left main and 2 vessel
coronary artery disease. Please note that although the presence of
coronary artery calcium documents the presence of coronary artery
disease, the severity of this disease and any potential stenosis
cannot be assessed on this non-gated CT examination. Assessment for
potential risk factor modification, dietary therapy or pharmacologic
therapy may be warranted, if clinically indicated.
3. Mild diffuse bronchial wall thickening with severe centrilobular
and paraseptal emphysema; imaging findings suggestive of underlying
COPD.

These results will be called to the ordering clinician or
representative by the Radiologist Assistant, and communication
documented in the PACS or [REDACTED].

Aortic Atherosclerosis ([id number]-[id number].E) and Emphysema ([id number].B).

## 2024-01-26 DIAGNOSIS — H90A21 Sensorineural hearing loss, unilateral, right ear, with restricted hearing on the contralateral side: Secondary | ICD-10-CM | POA: Diagnosis not present

## 2024-01-26 DIAGNOSIS — Z9622 Myringotomy tube(s) status: Secondary | ICD-10-CM | POA: Diagnosis not present

## 2024-01-26 DIAGNOSIS — R42 Dizziness and giddiness: Secondary | ICD-10-CM | POA: Diagnosis not present

## 2024-01-26 DIAGNOSIS — H903 Sensorineural hearing loss, bilateral: Secondary | ICD-10-CM | POA: Diagnosis not present

## 2024-02-23 NOTE — Therapy (Incomplete)
 OUTPATIENT PHYSICAL THERAPY VESTIBULAR EVALUATION     Patient Name: Jonathan Castro MRN: [id number] DOB:02-15-1956, 68 y.o., male Today's Date: 02/23/2024  END OF SESSION:   Past Medical History:  Diagnosis Date   Arthritis    Cancer (HCC)    COPD (chronic obstructive pulmonary disease) (HCC)    History of radiation therapy    Left Lung- 11/25/21-01/06/22- Dr. Lynwood Nasuti   History of radiation therapy    Brain- 02/26/22-03/13/22- Dr. Lynwood Nasuti   Past Surgical History:  Procedure Laterality Date   MEDIASTINOSCOPY N/A 10/20/2021   Procedure: MEDIASTINOSCOPY;  Surgeon: Kerrin Elspeth BROCKS, MD;  Location: [facility] OR;  Service: Thoracic;  Laterality: N/A;   MICROLARYNGOSCOPY W/VOCAL CORD INJECTION Left 04/03/2022   Procedure: MICROLARYNGOSCOPY WITH MEDIALIZATION OF LEFT VOCAL FOLD WITH PROLARYN;  Surgeon: Llewellyn Gerard LABOR, DO;  Location: [facility] OR;  Service: ENT;  Laterality: Left;   ROTATOR CUFF REPAIR Left    VIDEO BRONCHOSCOPY WITH ENDOBRONCHIAL ULTRASOUND N/A 10/20/2021   Procedure: VIDEO BRONCHOSCOPY WITH ENDOBRONCHIAL ULTRASOUND WITH BIOPSIES;  Surgeon: Kerrin Elspeth BROCKS, MD;  Location: [facility] OR;  Service: Thoracic;  Laterality: N/A;   Patient Active Problem List   Diagnosis Date Noted   Hoarseness of voice 04/03/2022   Primary small cell carcinoma of upper lobe of left lung (HCC) 10/30/2021   Encounter for antineoplastic chemotherapy 10/30/2021   Lung nodule 10/27/2021   DDD (degenerative disc disease), cervical 09/03/2021   Gastroesophageal reflux disease 09/03/2021   Hyperlipidemia 09/03/2021   Primary osteoarthritis 09/03/2021   Rheumatoid arthritis (HCC) 09/03/2021   Periapical abscess of tooth with fistula 09/22/2016   Maxillary sinusitis, chronic 08/11/2016    PCP: *** REFERRING PROVIDER: ***  REFERRING DIAG: ***  THERAPY DIAG:  No diagnosis found.  ONSET DATE: ***  Rationale for Evaluation and Treatment: {HABREHAB:27488}  SUBJECTIVE:   SUBJECTIVE  STATEMENT: *** Pt accompanied by: {accompnied:27141}  PERTINENT HISTORY: ***  PAIN:  Are you having pain? {OPRCPAIN:27236}  PRECAUTIONS: {Therapy precautions:24002}  RED FLAGS: {PT Red Flags:29287}   WEIGHT BEARING RESTRICTIONS: {Yes ***/No:24003}  FALLS: Has patient fallen in last 6 months? {fallsyesno:27318}  LIVING ENVIRONMENT: Lives with: {OPRC lives with:25569::lives with their family} Lives in: {Lives in:25570} Stairs: {opstairs:27293} Has following equipment at home: {Assistive devices:23999}  PLOF: {PLOF:24004}  PATIENT GOALS: ***  OBJECTIVE:  Note: Objective measures were completed at Evaluation unless otherwise noted.  DIAGNOSTIC FINDINGS: ***  COGNITION: Overall cognitive status: {cognition:24006}   SENSATION: {sensation:27233}  EDEMA:  {edema:24020}  MUSCLE TONE:  {LE tone:25568}  DTRs:  {DTR SITE:24025}  POSTURE:  {posture:25561}  Cervical ROM:    {AROM/PROM:27142} A/PROM (deg) eval  Flexion   Extension   Right lateral flexion   Left lateral flexion   Right rotation   Left rotation   (Blank rows = not tested)  STRENGTH: ***  LOWER EXTREMITY MMT:   MMT Right eval Left eval  Hip flexion    Hip abduction    Hip adduction    Hip internal rotation    Hip external rotation    Knee flexion    Knee extension    Ankle dorsiflexion    Ankle plantarflexion    Ankle inversion    Ankle eversion    (Blank rows = not tested)  BED MOBILITY:  {Bed mobility:24027}  TRANSFERS: Assistive device utilized: {Assistive devices:23999}  Sit to stand: {Levels of assistance:24026} Stand to sit: {Levels of assistance:24026} Chair to chair: {Levels of assistance:24026} Floor: {Levels of assistance:24026}  RAMP: {Levels of assistance:24026}  CURB: {Levels  of assistance:24026}  GAIT: Gait pattern: {gait characteristics:25376} Distance walked: *** Assistive device utilized: {Assistive devices:23999} Level of assistance: {Levels of  assistance:24026} Comments: ***  FUNCTIONAL TESTS:  {Functional tests:24029}  PATIENT SURVEYS:  {rehab surveys:24030}  VESTIBULAR ASSESSMENT:  GENERAL OBSERVATION: ***   SYMPTOM BEHAVIOR:  Subjective history: ***  Non-Vestibular symptoms: {nonvestibular symptoms:25260}  Type of dizziness: {Type of Dizziness:25255}  Frequency: ***  Duration: ***  Aggravating factors: {Aggravating Factors:25258}  Relieving factors: {Relieving Factors:25259}  Progression of symptoms: {DESC; BETTER/WORSE:18575}  OCULOMOTOR EXAM:  Ocular Alignment: {Ocular Alignment:25262}  Ocular ROM: {RANGE OF MOTION:21649}  Spontaneous Nystagmus: {Spontaneous nystagmus:25263}  Gaze-Induced Nystagmus: {gaze-induced nystagmus:25264}  Smooth Pursuits: {smooth pursuit:25265}  Saccades: {saccades:25266}  Convergence/Divergence: *** cm   FRENZEL - FIXATION SUPRESSED:  Ocular Alignment: {Ocular Alignment:25262}  Spontaneous Nystagmus: {Spontaneous nystagmus:25263}  Gaze-Induced Nystagmus: {gaze-induced nystagmus:25264}  Horizontal head shaking - induced nystagmus: {head shaking induced nystagmus:25267}  Vertical head shaking - induced nystagmus: {head shaking induced nystagmus:25267}  Positional tests: {Positional tests:25271}  Pressure tests: {frenzel pressure tests:25268}  VESTIBULAR - OCULAR REFLEX:   Slow VOR: {slow VOR:25290}  VOR Cancellation: {vor cancellation:25291}  Head-Impulse Test: {head impulse test:25272}  Dynamic Visual Acuity: {dynamic visual acuity:25273}   POSITIONAL TESTING: {Positional tests:25271}  MOTION SENSITIVITY:  Motion Sensitivity Quotient Intensity: 0 = none, 1 = Lightheaded, 2 = Mild, 3 = Moderate, 4 = Severe, 5 = Vomiting  Intensity  1. Sitting to supine   2. Supine to L side   3. Supine to R side   4. Supine to sitting   5. L Hallpike-Dix   6. Up from L    7. R Hallpike-Dix   8. Up from R    9. Sitting, head tipped to L knee   10. Head up from L knee   11. Sitting,  head tipped to R knee   12. Head up from R knee   13. Sitting head turns x5   14.Sitting head nods x5   15. In stance, 180 turn to L    16. In stance, 180 turn to R     OTHOSTATICS: {Exam; orthostatics:31331}  FUNCTIONAL GAIT: {Functional tests:24029}                                                                                                                             TREATMENT DATE: ***   Canalith Repositioning:  {Canalith Repositioning:25283} Gaze Adaptation:  {gaze adaptation:25286} Habituation:  {habituation:25288} Other: ***  PATIENT EDUCATION: Education details: *** Person educated: {Person educated:25204} Education method: {Education Method:25205} Education comprehension: {Education Comprehension:25206}  HOME EXERCISE PROGRAM:  GOALS: Goals reviewed with patient? {yes/no:20286}  SHORT TERM GOALS: Target date: ***  *** Baseline: Goal status: {GOALSTATUS:25110}  2.  *** Baseline:  Goal status: {GOALSTATUS:25110}  3.  *** Baseline:  Goal status: {GOALSTATUS:25110}  4.  *** Baseline:  Goal status: {GOALSTATUS:25110}  5.  *** Baseline:  Goal status: {GOALSTATUS:25110}  6.  *** Baseline:  Goal status: {GOALSTATUS:25110}  LONG  TERM GOALS: Target date: ***  *** Baseline:  Goal status: {GOALSTATUS:25110}  2.  *** Baseline:  Goal status: {GOALSTATUS:25110}  3.  *** Baseline:  Goal status: {GOALSTATUS:25110}  4.  *** Baseline:  Goal status: {GOALSTATUS:25110}  5.  *** Baseline:  Goal status: {GOALSTATUS:25110}  6.  *** Baseline:  Goal status: {GOALSTATUS:25110}  ASSESSMENT:  CLINICAL IMPRESSION: Patient is a *** y.o. *** who was seen today for physical therapy evaluation and treatment for ***.   OBJECTIVE IMPAIRMENTS: {opptimpairments:25111}.   ACTIVITY LIMITATIONS: {activitylimitations:27494}  PARTICIPATION LIMITATIONS: {participationrestrictions:25113}  PERSONAL FACTORS: {Personal factors:25162} are also affecting  patient's functional outcome.   REHAB POTENTIAL: {rehabpotential:25112}  CLINICAL DECISION MAKING: {clinical decision making:25114}  EVALUATION COMPLEXITY: {Evaluation complexity:25115}   PLAN:  PT FREQUENCY: {rehab frequency:25116}  PT DURATION: {rehab duration:25117}  PLANNED INTERVENTIONS: {rehab planned interventions:25118::97110-Therapeutic exercises,97530- Therapeutic [phone]- Neuromuscular re-education,97535- Self Rjmz,02859- Manual therapy}  PLAN FOR NEXT SESSION: ***   Greig GORMAN Quivers, PT 02/23/2024, 4:15 PM

## 2024-02-24 ENCOUNTER — Other Ambulatory Visit: Payer: Self-pay

## 2024-02-24 ENCOUNTER — Ambulatory Visit (HOSPITAL_COMMUNITY): Attending: Otolaryngology

## 2024-02-24 DIAGNOSIS — R262 Difficulty in walking, not elsewhere classified: Secondary | ICD-10-CM | POA: Insufficient documentation

## 2024-02-24 DIAGNOSIS — R42 Dizziness and giddiness: Secondary | ICD-10-CM | POA: Diagnosis not present

## 2024-02-24 DIAGNOSIS — R2689 Other abnormalities of gait and mobility: Secondary | ICD-10-CM | POA: Diagnosis not present

## 2024-02-24 NOTE — Therapy (Signed)
 OUTPATIENT PHYSICAL THERAPY LOWER EXTREMITY EVALUATION   Patient Name: Jonathan Castro MRN: [id number] DOB:06/16/1956, 68 y.o., male Today's Date: 02/24/2024  END OF SESSION:  PT End of Session - 02/24/24 1044     Visit Number 1    Number of Visits 8    Date for PT Re-Evaluation 03/23/24    Authorization Type HTA    Authorization Time Period no auth required    Progress Note Due on Visit 8    PT Start Time 0935    PT Stop Time 1014    PT Time Calculation (min) 39 min    Activity Tolerance Patient tolerated treatment well    Behavior During Therapy WFL for tasks assessed/performed          Past Medical History:  Diagnosis Date   Arthritis    Cancer (HCC)    COPD (chronic obstructive pulmonary disease) (HCC)    History of radiation therapy    Left Lung- 11/25/21-01/06/22- Dr. Lynwood Nasuti   History of radiation therapy    Brain- 02/26/22-03/13/22- Dr. Lynwood Nasuti   Past Surgical History:  Procedure Laterality Date   MEDIASTINOSCOPY N/A 10/20/2021   Procedure: MEDIASTINOSCOPY;  Surgeon: Kerrin Elspeth BROCKS, MD;  Location: [facility] OR;  Service: Thoracic;  Laterality: N/A;   MICROLARYNGOSCOPY W/VOCAL CORD INJECTION Left 04/03/2022   Procedure: MICROLARYNGOSCOPY WITH MEDIALIZATION OF LEFT VOCAL FOLD WITH PROLARYN;  Surgeon: Llewellyn Gerard LABOR, DO;  Location: [facility] OR;  Service: ENT;  Laterality: Left;   ROTATOR CUFF REPAIR Left    VIDEO BRONCHOSCOPY WITH ENDOBRONCHIAL ULTRASOUND N/A 10/20/2021   Procedure: VIDEO BRONCHOSCOPY WITH ENDOBRONCHIAL ULTRASOUND WITH BIOPSIES;  Surgeon: Kerrin Elspeth BROCKS, MD;  Location: [facility] OR;  Service: Thoracic;  Laterality: N/A;   Patient Active Problem List   Diagnosis Date Noted   Hoarseness of voice 04/03/2022   Primary small cell carcinoma of upper lobe of left lung (HCC) 10/30/2021   Encounter for antineoplastic chemotherapy 10/30/2021   Lung nodule 10/27/2021   DDD (degenerative disc disease), cervical 09/03/2021   Gastroesophageal reflux  disease 09/03/2021   Hyperlipidemia 09/03/2021   Primary osteoarthritis 09/03/2021   Rheumatoid arthritis (HCC) 09/03/2021   Periapical abscess of tooth with fistula 09/22/2016   Maxillary sinusitis, chronic 08/11/2016    PCP: Sheryle Carwin, MD  REFERRING PROVIDER: Llewellyn Gerard LABOR, DO  REFERRING DIAG: R42 (ICD-10-CM) - Dizziness  THERAPY DIAG:  Difficulty in walking, not elsewhere classified  Other abnormalities of gait and mobility  Impairment of balance  Rationale for Evaluation and Treatment: Rehabilitation  ONSET DATE: since 2023 but worse lately over the last 1.5 years  SUBJECTIVE:   SUBJECTIVE STATEMENT: No dizziness just weak; lose my balance easily since his cancer treatments a couple years ago; still working; living alone has fallen 3 times in the past 6 months.    PERTINENT HISTORY:  Rotator cuff surgery earlier this year per Dr. [facility] Lung cancer last treatment 2023; since in remission; sees Dr. Gatha for follow ups  PAIN:  Are you having pain? No  PRECAUTIONS: None   WEIGHT BEARING RESTRICTIONS: No  FALLS:  Has patient fallen in last 6 months? Yes. Number of falls 3  OCCUPATION: landscaping type stuff  PLOF: Independent  PATIENT GOALS: better balance and get stronger  NEXT MD VISIT: 6 months to referring MD  OBJECTIVE:  Note: Objective measures were completed at Evaluation unless otherwise noted.  DIAGNOSTIC FINDINGS: none  PATIENT SURVEYS:  ABC scale: The Activities-Specific Balance Confidence (ABC) Scale 0% 10  20 30 40 50 60 70 80 90 100% No confidence<->completely confident  "How confident are you that you will not lose your balance or become unsteady when you . . .   Date tested 02/24/2024                                                  Total: #/16 87.5%  1400/1600     COGNITION: Overall cognitive status: Within functional limits for tasks assessed     SENSATION: WFL  EDEMA:  None  noted   POSTURE: rounded shoulders, forward head, and decreased lumbar lordosis  PALPATION: No specific tenderness noted  LOWER EXTREMITY ROM:  Active ROM Right eval Left eval  Hip flexion    Hip extension    Hip abduction    Hip adduction    Hip internal rotation    Hip external rotation    Knee flexion    Knee extension    Ankle dorsiflexion    Ankle plantarflexion    Ankle inversion    Ankle eversion     (Blank rows = not tested)  LOWER EXTREMITY MMT:  MMT Right eval Left eval  Hip flexion 4 4  Hip extension 3+ 3+  Hip abduction    Hip adduction    Hip internal rotation    Hip external rotation    Knee flexion 4+ 4+  Knee extension 4+ 4+  Ankle dorsiflexion 4 4  Ankle plantarflexion    Ankle inversion    Ankle eversion     (Blank rows = not tested)  L FUNCTIONAL TESTS:   SLS right 5 and left 3 5 times sit to stand: 17.28 sec hands on thighs Dynamic Gait Index: Dynamic Gait Index  Mark the lowest level that applies.   Date Performed 02/24/24  Gait level surface (2) Mild Impairment: Walks 20', uses AD, slower speed, mild gait deviations  2. Change in gait speed (2) Mild Impairment: Is able to change speed but demonstrates mild gait deviations, or not gait deviations but unable to achieve a significant change in velocity, or uses an assistive device  3. Gait with horizontal head turns (2) Mild Impairment: Performs head turns smoothly with slight change in gait velocity, i.e., minor disruption to smooth gait path or uses walking aid  4. Gait with vertical head turns (2) Mild Impairment: Performs head turns smoothly with slight change in gait velocity, i.e., minor disruption to smooth gait path or uses walking aid  5. Gait and pivot turn (3) Normal: Pivot turns safely within 3 seconds and stops quickly with no loss of balance  6. Step over obstacle (2) Mild Impairment: Is able to step over box, but must slow down and adjust steps to clear box safely  7. Step  around obstacle (2) Mild Impairment: Is able to step around both cones, but must slow down and adjust steps to clear cones  8. Steps (2) Mild Impairment: Alternating feet, must use rail  Total score 17/24    Score Interpretation: Score of <19 indicates high risk of falls.  Minimally Clinically Important Difference (MCID):  =DGI scores of<21/24 = 1.80 points DGI scores of >21/24 = 0.60 points   Venetie T, Inbar-Borovsky N, Brozgol M, Giladi N, [place] JM. The Dynamic Gait Index in healthy older adults: the role of stair climbing, fear of falling and gender. Gait  Posture. 2009 Feb;29(2):237-41. doi: 10.1016/j.gaitpost.2008.08.013. Epub 2008 Oct 8. PMID: 81154560; PMCID: EFR7290501.  Pardasaney, MYRTIS LOIS Bonus, GEANNIE POUR., et al. (2012). Sensitivity to change and responsiveness of four balance measures for community-dwelling older adults. Physical therapy 92(3): 388-397.    GAIT: Distance walked: 60 ft in clinic Assistive device utilized: None Level of assistance: Modified independence Comments: decreased gait speed                                                                                                                                TREATMENT DATE: 02/24/24 physical therapy evaluation and HEP instruction    PATIENT EDUCATION:  Education details: Patient educated on exam findings, POC, scope of PT, HEP, and what to expect next visit. Person educated: Patient Education method: Explanation, Demonstration, and Handouts Education comprehension: verbalized understanding, returned demonstration, verbal cues required, and tactile cues required  HOME EXERCISE PROGRAM: Access Code: [email or web address] URL: [email or web address] Date: 02/24/2024 Prepared by: AP - Rehab  Exercises - Supine Bridge  - 1 x daily - 7 x weekly - 2 sets - 10 reps - Sit to Stand  - 1 x daily - 7 x weekly - 1 sets - 10 reps - standing single leg balance at the counter (try to not hold on)  - 1 x daily - 7  x weekly - 1 sets - 10 reps - 10 sec hold - Standing Hip Extension with Counter Support  - 1 x daily - 7 x weekly - 2 sets - 10 reps - Standing Hip Abduction with Counter Support  - 1 x daily - 7 x weekly - 2 sets - 10 reps - Heel Raises with Counter Support  - 1 x daily - 7 x weekly - 2 sets - 10 reps  ASSESSMENT:  CLINICAL IMPRESSION: Patient is a 68 y.o. male who was seen today for physical therapy evaluation and treatment for R42 (ICD-10-CM) - Dizziness. Although he describes his issues are more imbalance and weakness.   Patient demonstrates decreased strength, balance deficits and gait abnormalities which are negatively impacting patient ability to perform ADLs and functional mobility tasks. Patient will benefit from skilled physical therapy services to address these deficits to improve level of function with ADLs, functional mobility tasks, and reduce risk for falls.    OBJECTIVE IMPAIRMENTS: Abnormal gait, decreased activity tolerance, decreased balance, difficulty walking, decreased strength, and impaired perceived functional ability.   ACTIVITY LIMITATIONS: bending, standing, squatting, and locomotion level  PARTICIPATION LIMITATIONS: cleaning and yard work  PERSONAL FACTORS: cancer history are also affecting patient's functional outcome.   REHAB POTENTIAL: Good  CLINICAL DECISION MAKING: Evolving/moderate complexity  EVALUATION COMPLEXITY: Moderate   GOALS: Goals reviewed with patient? No  SHORT TERM GOALS: Target date: 03/09/2024 patient will be independent with initial HEP  Baseline: Goal status: INITIAL  2.  Patient will report 50% improvement overall  Baseline:  Goal status: INITIAL  3.  Patient will able to stand SLS  on left leg x 10 to demonstrate improved functional balance  Baseline: see above Goal status: INITIAL   LONG TERM GOALS: Target date: 03/23/2024  Patient will be independent in self management strategies to improve quality of life and  functional outcomes.  Baseline:  Goal status: INITIAL  2.  Patient will report 70% improvement overall  Baseline:  Goal status: INITIAL  3.  Patient will able to stand SLS on left leg x 30 to demonstrate improved functional balance  Baseline:  Goal status: INITIAL  4.   Patient will increase  leg MMT's to 5/5 to allow navigation of steps without gait deviation or loss of balance  Baseline: see above Goal status: INITIAL  5.  Patient will increased DGI score to 20/24 to demonstrate improved balance with functional activity. Baseline: 17/24 Goal status: INITIAL   PLAN:  PT FREQUENCY: 2x/week  PT DURATION: 4 weeks  PLANNED INTERVENTIONS: 97164- PT Re-evaluation, 97110-Therapeutic exercises, 97530- Therapeutic activity, 97112- Neuromuscular re-education, 97535- Self Care, 02859- Manual therapy, [phone]- Gait training, [phone]- Orthotic Fit/training, [phone]- Canalith repositioning, [id number]- Aquatic Therapy, [phone]- Splinting, [phone]- Wound care (first 20 sq cm), 97598- Wound care (each additional 20 sq cm)Patient/Family education, Balance training, Stair training, Taping, Dry Needling, Joint mobilization, Joint manipulation, Spinal manipulation, Spinal mobilization, Scar mobilization, and DME instructions.   PLAN FOR NEXT SESSION: Review HEP and goals; lower extremity strengthening and higher level balance activity   10:51 AM, 02/24/24 Asael Pann Small Homer Miller MPT [facility] [place] [phone] Ph:[phone]

## 2024-03-01 DIAGNOSIS — D3132 Benign neoplasm of left choroid: Secondary | ICD-10-CM | POA: Diagnosis not present

## 2024-03-06 ENCOUNTER — Ambulatory Visit (HOSPITAL_COMMUNITY): Attending: Otolaryngology | Admitting: Physical Therapy

## 2024-03-06 DIAGNOSIS — R2689 Other abnormalities of gait and mobility: Secondary | ICD-10-CM | POA: Insufficient documentation

## 2024-03-06 DIAGNOSIS — R262 Difficulty in walking, not elsewhere classified: Secondary | ICD-10-CM | POA: Diagnosis not present

## 2024-03-06 NOTE — Therapy (Signed)
 OUTPATIENT PHYSICAL THERAPY LOWER EXTREMITY TREATMENT   Patient Name: Jonathan Castro MRN: [id number] DOB:Mar 22, 1956, 68 y.o., male Today's Date: 03/06/2024  END OF SESSION:  PT End of Session - 03/06/24 1023     Visit Number 2    Number of Visits 8    Date for PT Re-Evaluation 03/23/24    Authorization Type HTA    Authorization Time Period no auth required    Progress Note Due on Visit 8    PT Start Time 0934    PT Stop Time 1018    PT Time Calculation (min) 44 min    Activity Tolerance Patient tolerated treatment well    Behavior During Therapy [facility] for tasks assessed/performed           Past Medical History:  Diagnosis Date   Arthritis    Cancer (HCC)    COPD (chronic obstructive pulmonary disease) (HCC)    History of radiation therapy    Left Lung- 11/25/21-01/06/22- Dr. Lynwood Nasuti   History of radiation therapy    Brain- 02/26/22-03/13/22- Dr. Lynwood Nasuti   Past Surgical History:  Procedure Laterality Date   MEDIASTINOSCOPY N/A 10/20/2021   Procedure: MEDIASTINOSCOPY;  Surgeon: Kerrin Elspeth BROCKS, MD;  Location: [facility] OR;  Service: Thoracic;  Laterality: N/A;   MICROLARYNGOSCOPY W/VOCAL CORD INJECTION Left 04/03/2022   Procedure: MICROLARYNGOSCOPY WITH MEDIALIZATION OF LEFT VOCAL FOLD WITH PROLARYN;  Surgeon: Llewellyn Gerard LABOR, DO;  Location: [facility] OR;  Service: ENT;  Laterality: Left;   ROTATOR CUFF REPAIR Left    VIDEO BRONCHOSCOPY WITH ENDOBRONCHIAL ULTRASOUND N/A 10/20/2021   Procedure: VIDEO BRONCHOSCOPY WITH ENDOBRONCHIAL ULTRASOUND WITH BIOPSIES;  Surgeon: Kerrin Elspeth BROCKS, MD;  Location: [facility] OR;  Service: Thoracic;  Laterality: N/A;   Patient Active Problem List   Diagnosis Date Noted   Hoarseness of voice 04/03/2022   Primary small cell carcinoma of upper lobe of left lung (HCC) 10/30/2021   Encounter for antineoplastic chemotherapy 10/30/2021   Lung nodule 10/27/2021   DDD (degenerative disc disease), cervical 09/03/2021   Gastroesophageal reflux  disease 09/03/2021   Hyperlipidemia 09/03/2021   Primary osteoarthritis 09/03/2021   Rheumatoid arthritis (HCC) 09/03/2021   Periapical abscess of tooth with fistula 09/22/2016   Maxillary sinusitis, chronic 08/11/2016    PCP: Sheryle Carwin, MD  REFERRING PROVIDER: Skotnicki, Meghan A, DO  REFERRING DIAG: R42 (ICD-10-CM) - Dizziness  THERAPY DIAG:  No diagnosis found.  Rationale for Evaluation and Treatment: Rehabilitation  ONSET DATE: since 2023 but worse lately over the last 1.5 years  SUBJECTIVE:   SUBJECTIVE STATEMENT: Pt reports no issues currently and feels he's overall getting better already.    Evaluation:  No dizziness just weak; lose my balance easily since his cancer treatments a couple years ago; still working; living alone has fallen 3 times in the past 6 months.    PERTINENT HISTORY:  Rotator cuff surgery earlier this year per Dr. Cristy Hea [facility] cancer last treatment 2023; since in remission; sees Dr. Gatha for follow ups  PAIN:  Are you having pain? No  PRECAUTIONS: None   WEIGHT BEARING RESTRICTIONS: No  FALLS:  Has patient fallen in last 6 months? Yes. Number of falls 3  OCCUPATION: landscaping type stuff  PLOF: Independent  PATIENT GOALS: better balance and get stronger  NEXT MD VISIT: 6 months to referring MD  OBJECTIVE:  Note: Objective measures were completed at Evaluation unless otherwise noted.  DIAGNOSTIC FINDINGS: none  PATIENT SURVEYS:  ABC scale: The Activities-Specific Balance Confidence (  ABC) Scale 0% 10 20 30  40 50 60 70 80 90 100% No confidence<->completely confident  "How confident are you that you will not lose your balance or become unsteady when you . . .   Date tested 02/24/2024  Total: #/16 87.5%  1400/1600     COGNITION: Overall cognitive status: Within functional limits for tasks assessed     SENSATION: WFL  EDEMA:  None noted   POSTURE: rounded shoulders, forward head, and decreased lumbar  lordosis  PALPATION: No specific tenderness noted  LOWER EXTREMITY ROM:  Active ROM Right eval Left eval  Hip flexion    Hip extension    Hip abduction    Hip adduction    Hip internal rotation    Hip external rotation    Knee flexion    Knee extension    Ankle dorsiflexion    Ankle plantarflexion    Ankle inversion    Ankle eversion     (Blank rows = not tested)  LOWER EXTREMITY MMT:  MMT Right eval Left eval  Hip flexion 4 4  Hip extension 3+ 3+  Hip abduction    Hip adduction    Hip internal rotation    Hip external rotation    Knee flexion 4+ 4+  Knee extension 4+ 4+  Ankle dorsiflexion 4 4  Ankle plantarflexion    Ankle inversion    Ankle eversion     (Blank rows = not tested)  L FUNCTIONAL TESTS:   SLS right 5 and left 3 5 times sit to stand: 17.28 sec hands on thighs Dynamic Gait Index: Dynamic Gait Index  Mark the lowest level that applies.   Date Performed 02/24/24  Gait level surface (2) Mild Impairment: Walks 20', uses AD, slower speed, mild gait deviations  2. Change in gait speed (2) Mild Impairment: Is able to change speed but demonstrates mild gait deviations, or not gait deviations but unable to achieve a significant change in velocity, or uses an assistive device  3. Gait with horizontal head turns (2) Mild Impairment: Performs head turns smoothly with slight change in gait velocity, i.e., minor disruption to smooth gait path or uses walking aid  4. Gait with vertical head turns (2) Mild Impairment: Performs head turns smoothly with slight change in gait velocity, i.e., minor disruption to smooth gait path or uses walking aid  5. Gait and pivot turn (3) Normal: Pivot turns safely within 3 seconds and stops quickly with no loss of balance  6. Step over obstacle (2) Mild Impairment: Is able to step over box, but must slow down and adjust steps to clear box safely  7. Step around obstacle (2) Mild Impairment: Is able to step around both cones,  but must slow down and adjust steps to clear cones  8. Steps (2) Mild Impairment: Alternating feet, must use rail  Total score 17/24    Score Interpretation: Score of <19 indicates high risk of falls.  Minimally Clinically Important Difference (MCID):  =DGI scores of<21/24 = 1.80 points DGI scores of >21/24 = 0.60 points   [place] T, Inbar-Borovsky N, Brozgol M, Giladi N, [place] JM. The Dynamic Gait Index in healthy older adults: the role of stair climbing, fear of falling and gender. Gait Posture. 2009 Feb;29(2):237-41. doi: 10.1016/j.gaitpost.2008.08.013. Epub 2008 Oct 8. PMID: 81154560; PMCID: EFR7290501.  Pardasaney, MYRTIS LOIS Bonus, GEANNIE POUR., et al. (2012). Sensitivity to change and responsiveness of four balance measures for community-dwelling older adults. Physical therapy 92(3): 388-397.    GAIT:  Distance walked: 60 ft in clinic Assistive device utilized: None Level of assistance: Modified independence Comments: decreased gait speed                                                                                                                                TREATMENT DATE:  03/06/24 Standing:  hip abduction 2X10  Hip extension 2X10  Heelraises on incline 20X  Toeraises on decline 20X  Tandem stance 30 x3 each LE lead  SLS 30 trial each with mas Rt: 12, L: 4  Vectors, 3 way leg kick 5X5 2 sets; each with 1 UE assist Sit to stands no UE 10X   02/24/24 physical therapy evaluation and HEP instruction    PATIENT EDUCATION:  Education details: Patient educated on exam findings, POC, scope of PT, HEP, and what to expect next visit. Person educated: Patient Education method: Explanation, Demonstration, and Handouts Education comprehension: verbalized understanding, returned demonstration, verbal cues required, and tactile cues required  HOME EXERCISE PROGRAM: Access Code: [email or web address] URL: [email or web address] Date: 02/24/2024 Prepared by: AP -  Rehab  Exercises - Supine Bridge  - 1 x daily - 7 x weekly - 2 sets - 10 reps - Sit to Stand  - 1 x daily - 7 x weekly - 1 sets - 10 reps - standing single leg balance at the counter (try to not hold on)  - 1 x daily - 7 x weekly - 1 sets - 10 reps - 10 sec hold - Standing Hip Extension with Counter Support  - 1 x daily - 7 x weekly - 2 sets - 10 reps - Standing Hip Abduction with Counter Support  - 1 x daily - 7 x weekly - 2 sets - 10 reps - Heel Raises with Counter Support  - 1 x daily - 7 x weekly - 2 sets - 10 reps   Access Code: [email or web address] URL: [email or web address] Date: 03/06/2024 Prepared by: Greig Fuse - Tandem Stance with Support  - 2 x daily - 7 x weekly - 3 reps - 30 sec hold - Single Leg Stance with Support  - 2 x daily - 7 x weekly - 3 reps - Standing 3-Way Kick  - 2 x daily - 7 x weekly - 10 reps - 5 sec hold  ASSESSMENT:  CLINICAL IMPRESSION: Reviewed goals and POC moving forward.  Reviewed HEP and able to demonstrate correctly with only minor cues for form.  Progressed with balance and stabilization therex today including tandem stance, SLS and vectors.   Lt weaker than Rt and noted mm tremors with tandem due to weakness.  Max ability to maintain SLS without UE assist for Lt only 4 compared to 12 on Rt.  Updated HEP to include these balance challenges.  Pt only required one seated rest break during session today.  Patient will benefit from skilled physical therapy services to address these deficits to improve level of function with ADLs, functional  mobility tasks, and reduce risk for falls.    OBJECTIVE IMPAIRMENTS: Abnormal gait, decreased activity tolerance, decreased balance, difficulty walking, decreased strength, and impaired perceived functional ability.   ACTIVITY LIMITATIONS: bending, standing, squatting, and locomotion level  PARTICIPATION LIMITATIONS: cleaning and yard work  PERSONAL FACTORS: cancer history are also affecting patient's  functional outcome.   REHAB POTENTIAL: Good  CLINICAL DECISION MAKING: Evolving/moderate complexity  EVALUATION COMPLEXITY: Moderate   GOALS: Goals reviewed with patient? No  SHORT TERM GOALS: Target date: 03/09/2024 patient will be independent with initial HEP  Baseline: Goal status: INITIAL  2.  Patient will report 50% improvement overall  Baseline:  Goal status: INITIAL  3.  Patient will able to stand SLS on left leg x 10 to demonstrate improved functional balance  Baseline: see above Goal status: INITIAL   LONG TERM GOALS: Target date: 03/23/2024  Patient will be independent in self management strategies to improve quality of life and functional outcomes.  Baseline:  Goal status: INITIAL  2.  Patient will report 70% improvement overall  Baseline:  Goal status: INITIAL  3.  Patient will able to stand SLS on left leg x 30 to demonstrate improved functional balance  Baseline:  Goal status: INITIAL  4.   Patient will increase  leg MMT's to 5/5 to allow navigation of steps without gait deviation or loss of balance  Baseline: see above Goal status: INITIAL  5.  Patient will increased DGI score to 20/24 to demonstrate improved balance with functional activity. Baseline: 17/24 Goal status: INITIAL   PLAN:  PT FREQUENCY: 2x/week  PT DURATION: 4 weeks  PLANNED INTERVENTIONS: 97164- PT Re-evaluation, 97110-Therapeutic exercises, 97530- Therapeutic activity, 97112- Neuromuscular re-education, 97535- Self Care, [id number]- Manual therapy, [phone]- Gait training, [phone]- Orthotic Fit/training, [phone]- Canalith repositioning, [id number]- Aquatic Therapy, [phone]- Splinting, [phone]- Wound care (first 20 sq cm), 97598- Wound care (each additional 20 sq cm)Patient/Family education, Balance training, Stair training, Taping, Dry Needling, Joint mobilization, Joint manipulation, Spinal manipulation, Spinal mobilization, Scar mobilization, and DME instructions.   PLAN FOR NEXT  SESSION: Review HEP and goals; lower extremity strengthening and higher level balance activity   10:24 AM, 03/06/24 Greig KATHEE Fuse, PTA/CLT [facility] Ph: [phone]

## 2024-03-09 ENCOUNTER — Encounter (HOSPITAL_COMMUNITY)

## 2024-03-10 ENCOUNTER — Encounter (HOSPITAL_COMMUNITY)

## 2024-03-15 ENCOUNTER — Ambulatory Visit (HOSPITAL_COMMUNITY): Admitting: Physical Therapy

## 2024-03-15 DIAGNOSIS — R262 Difficulty in walking, not elsewhere classified: Secondary | ICD-10-CM

## 2024-03-15 DIAGNOSIS — R2689 Other abnormalities of gait and mobility: Secondary | ICD-10-CM

## 2024-03-15 NOTE — Therapy (Signed)
 OUTPATIENT PHYSICAL THERAPY LOWER EXTREMITY TREATMENT   Patient Name: Jonathan Castro MRN: [id number] DOB:[birth year], 68 y.o., male Today's Date: 03/15/2024  END OF SESSION:  PT End of Session - 03/15/24 0946     Visit Number 3    Number of Visits 8    Date for PT Re-Evaluation 03/23/24    Authorization Type HTA    Authorization Time Period no auth required    Progress Note Due on Visit 8    PT Start Time 0932    PT Stop Time 1014    PT Time Calculation (min) 42 min    Activity Tolerance Patient tolerated treatment well    Behavior During Therapy WFL for tasks assessed/performed            Past Medical History:  Diagnosis Date   Arthritis    Cancer (HCC)    COPD (chronic obstructive pulmonary disease) (HCC)    History of radiation therapy    Left Lung- 11/25/21-01/06/22- Dr. Lynwood Nasuti   History of radiation therapy    Brain- 02/26/22-03/13/22- Dr. Lynwood Nasuti   Past Surgical History:  Procedure Laterality Date   MEDIASTINOSCOPY N/A 10/20/2021   Procedure: MEDIASTINOSCOPY;  Surgeon: Kerrin Elspeth BROCKS, MD;  Location: [facility] OR;  Service: Thoracic;  Laterality: N/A;   MICROLARYNGOSCOPY W/VOCAL CORD INJECTION Left 04/03/2022   Procedure: MICROLARYNGOSCOPY WITH MEDIALIZATION OF LEFT VOCAL FOLD WITH PROLARYN;  Surgeon: Llewellyn Gerard LABOR, DO;  Location: [facility] OR;  Service: ENT;  Laterality: Left;   ROTATOR CUFF REPAIR Left    VIDEO BRONCHOSCOPY WITH ENDOBRONCHIAL ULTRASOUND N/A 10/20/2021   Procedure: VIDEO BRONCHOSCOPY WITH ENDOBRONCHIAL ULTRASOUND WITH BIOPSIES;  Surgeon: Kerrin Elspeth BROCKS, MD;  Location: [facility] OR;  Service: Thoracic;  Laterality: N/A;   Patient Active Problem List   Diagnosis Date Noted   Hoarseness of voice 04/03/2022   Primary small cell carcinoma of upper lobe of left lung (HCC) 10/30/2021   Encounter for antineoplastic chemotherapy 10/30/2021   Lung nodule 10/27/2021   DDD (degenerative disc disease), cervical 09/03/2021   Gastroesophageal reflux  disease 09/03/2021   Hyperlipidemia 09/03/2021   Primary osteoarthritis 09/03/2021   Rheumatoid arthritis (HCC) 09/03/2021   Periapical abscess of tooth with fistula 09/22/2016   Maxillary sinusitis, chronic 08/11/2016    PCP: Sheryle Carwin, MD  REFERRING PROVIDER: Skotnicki, Meghan A, DO  REFERRING DIAG: R42 (ICD-10-CM) - Dizziness  THERAPY DIAG:  Difficulty in walking, not elsewhere classified  Other abnormalities of gait and mobility  Impairment of balance  Rationale for Evaluation and Treatment: Rehabilitation  ONSET DATE: since 2023 but worse lately over the last 1.5 years  SUBJECTIVE:   SUBJECTIVE STATEMENT: Pt reports he is doing well, compliant with HEP.  Reports he lost is balance in the bathroom when turning quickly and had to grab the wall, otherwise no other incidences.      Evaluation:  No dizziness just weak; lose my balance easily since his cancer treatments a couple years ago; still working; living alone has fallen 3 times in the past 6 months.    PERTINENT HISTORY:  Rotator cuff surgery earlier this year per Dr. Cristy [facility] Ho[facility] Lung cancer last treatment 2023; since in remission; sees Dr. Gatha for follow ups  PAIN:  Are you having pain? No  PRECAUTIONS: None   WEIGHT BEARING RESTRICTIONS: No  FALLS:  Has patient fallen in last 6 months? Yes. Number of falls 3  OCCUPATION: landscaping type stuff  PLOF: Independent  PATIENT GOALS: better balance and get stronger  NEXT MD VISIT: 6 months to referring MD  OBJECTIVE:  Note: Objective measures were completed at Evaluation unless otherwise noted.  DIAGNOSTIC FINDINGS: none  PATIENT SURVEYS:  ABC scale: The Activities-Specific Balance Confidence (ABC) Scale 0% 10 20 30  40 50 60 70 80 90 100% No confidence<->completely confident  "How confident are you that you will not lose your balance or become unsteady when you . . .   Date tested 02/24/2024  Total: #/16 87.5%  1400/1600      COGNITION: Overall cognitive status: Within functional limits for tasks assessed     SENSATION: WFL  EDEMA:  None noted   POSTURE: rounded shoulders, forward head, and decreased lumbar lordosis  PALPATION: No specific tenderness noted  LOWER EXTREMITY ROM:  Active ROM Right eval Left eval  Hip flexion    Hip extension    Hip abduction    Hip adduction    Hip internal rotation    Hip external rotation    Knee flexion    Knee extension    Ankle dorsiflexion    Ankle plantarflexion    Ankle inversion    Ankle eversion     (Blank rows = not tested)  LOWER EXTREMITY MMT:  MMT Right eval Left eval  Hip flexion 4 4  Hip extension 3+ 3+  Hip abduction    Hip adduction    Hip internal rotation    Hip external rotation    Knee flexion 4+ 4+  Knee extension 4+ 4+  Ankle dorsiflexion 4 4  Ankle plantarflexion    Ankle inversion    Ankle eversion     (Blank rows = not tested)  L FUNCTIONAL TESTS:   SLS right 5 and left 3 5 times sit to stand: 17.28 sec hands on thighs Dynamic Gait Index: Dynamic Gait Index  Mark the lowest level that applies.   Date Performed 02/24/24  Gait level surface (2) Mild Impairment: Walks 20', uses AD, slower speed, mild gait deviations  2. Change in gait speed (2) Mild Impairment: Is able to change speed but demonstrates mild gait deviations, or not gait deviations but unable to achieve a significant change in velocity, or uses an assistive device  3. Gait with horizontal head turns (2) Mild Impairment: Performs head turns smoothly with slight change in gait velocity, i.e., minor disruption to smooth gait path or uses walking aid  4. Gait with vertical head turns (2) Mild Impairment: Performs head turns smoothly with slight change in gait velocity, i.e., minor disruption to smooth gait path or uses walking aid  5. Gait and pivot turn (3) Normal: Pivot turns safely within 3 seconds and stops quickly with no loss of balance  6. Step  over obstacle (2) Mild Impairment: Is able to step over box, but must slow down and adjust steps to clear box safely  7. Step around obstacle (2) Mild Impairment: Is able to step around both cones, but must slow down and adjust steps to clear cones  8. Steps (2) Mild Impairment: Alternating feet, must use rail  Total score 17/24    Score Interpretation: Score of <19 indicates high risk of falls.  Minimally Clinically Important Difference (MCID):  =DGI scores of<21/24 = 1.80 points DGI scores of >21/24 = 0.60 points   [place] T, Inbar-Borovsky N, Brozgol M, Giladi N, [place] JM. The Dynamic Gait Index in healthy older adults: the role of stair climbing, fear of falling and gender. Gait Posture. 2009 Feb;29(2):237-41. doi: 10.1016/j.gaitpost.2008.08.013. Epub 2008 Oct 8.  PMID: 81154560; PMCID: EFR7290501.  Pardasaney, MYRTIS LOIS Bonus, GEANNIE POUR., et al. (2012). Sensitivity to change and responsiveness of four balance measures for community-dwelling older adults. Physical therapy 92(3): 388-397.    GAIT: Distance walked: 60 ft in clinic Assistive device utilized: None Level of assistance: Modified independence Comments: decreased gait speed                                                                                                                                TREATMENT DATE:  03/15/24 Standing:  hip abduction 2X10  Hip extension 2X10  Heelraises on incline 20X  Toeraises on decline 20X  Forward lunges onto 4 step 2X10 no UE  4 lateral step downs 20X each LE   Tandem stance 30 x1 each LE lead  SLS 30 trial each with mas Rt: 20, L: 10  Vectors, 3 way leg kick 5X5 1 sets; each with 1 UE assist   03/06/24 Standing:  hip abduction 2X10  Hip extension 2X10  Heelraises on incline 20X  Toeraises on decline 20X  Tandem stance 30 x3 each LE lead  SLS 30 trial each with mas Rt: 12, L: 4  Vectors, 3 way leg kick 5X5 2 sets; each with 1 UE assist Sit to stands no UE  10X   02/24/24 physical therapy evaluation and HEP instruction    PATIENT EDUCATION:  Education details: Patient educated on exam findings, POC, scope of PT, HEP, and what to expect next visit. Person educated: Patient Education method: Explanation, Demonstration, and Handouts Education comprehension: verbalized understanding, returned demonstration, verbal cues required, and tactile cues required  HOME EXERCISE PROGRAM: Access Code: [email or web address] URL: [email or web address] Date: 02/24/2024 Prepared by: [facility] - Rehab  Exercises - Supine Bridge  - 1 x daily - 7 x weekly - 2 sets - 10 reps - Sit to Stand  - 1 x daily - 7 x weekly - 1 sets - 10 reps - standing single leg balance at the counter (try to not hold on)  - 1 x daily - 7 x weekly - 1 sets - 10 reps - 10 sec hold - Standing Hip Extension with Counter Support  - 1 x daily - 7 x weekly - 2 sets - 10 reps - Standing Hip Abduction with Counter Support  - 1 x daily - 7 x weekly - 2 sets - 10 reps - Heel Raises with Counter Support  - 1 x daily - 7 x weekly - 2 sets - 10 reps   Access Code: [email or web address] URL: [email or web address] Date: 03/06/2024 Prepared by: Greig Fuse - Tandem Stance with Support  - 2 x daily - 7 x weekly - 3 reps - 30 sec hold - Single Leg Stance with Support  - 2 x daily - 7 x weekly - 3 reps - Standing 3-Way Kick  - 2 x daily - 7 x weekly - 10 reps - 5 sec hold  ASSESSMENT:  CLINICAL  IMPRESSION: Continued with focus on LE strength and stabilization. Added lunges with ability to complete without UE assist to work on stability.  Eccentric lowering with lateral step downs added to focus on quadricep strength.    Pt continues to require minimal cues for form and upright stabilization with standing exercises.   Max ability to maintain SLS without UE assist improved today with Lt at 10 and 20 on Rt.  No updates to  HEP today.   Pt only required one seated rest break during session today.   Patient will benefit from skilled physical therapy services to address these deficits to improve level of function with ADLs, functional mobility tasks, and reduce risk for falls.   OBJECTIVE IMPAIRMENTS: Abnormal gait, decreased activity tolerance, decreased balance, difficulty walking, decreased strength, and impaired perceived functional ability.   ACTIVITY LIMITATIONS: bending, standing, squatting, and locomotion level  PARTICIPATION LIMITATIONS: cleaning and yard work  PERSONAL FACTORS: cancer history are also affecting patient's functional outcome.   REHAB POTENTIAL: Good  CLINICAL DECISION MAKING: Evolving/moderate complexity  EVALUATION COMPLEXITY: Moderate   GOALS: Goals reviewed with patient? No  SHORT TERM GOALS: Target date: 03/09/2024 patient will be independent with initial HEP  Baseline: Goal status: INITIAL  2.  Patient will report 50% improvement overall  Baseline:  Goal status: INITIAL  3.  Patient will able to stand SLS on left leg x 10 to demonstrate improved functional balance  Baseline: see above Goal status: INITIAL   LONG TERM GOALS: Target date: 03/23/2024  Patient will be independent in self management strategies to improve quality of life and functional outcomes.  Baseline:  Goal status: INITIAL  2.  Patient will report 70% improvement overall  Baseline:  Goal status: INITIAL  3.  Patient will able to stand SLS on left leg x 30 to demonstrate improved functional balance  Baseline:  Goal status: INITIAL  4.   Patient will increase  leg MMT's to 5/5 to allow navigation of steps without gait deviation or loss of balance  Baseline: see above Goal status: INITIAL  5.  Patient will increased DGI score to 20/24 to demonstrate improved balance with functional activity. Baseline: 17/24 Goal status: INITIAL   PLAN:  PT FREQUENCY: 2x/week  PT DURATION: 4 weeks  PLANNED INTERVENTIONS: 97164- PT Re-evaluation, 97110-Therapeutic  exercises, 97530- Therapeutic activity, 97112- Neuromuscular re-education, 97535- Self Care, [id number]- Manual therapy, [phone]- Gait training, [phone]- Orthotic Fit/training, [phone]- Canalith repositioning, [id number]- Aquatic Therapy, [phone]- Splinting, [phone]- Wound care (first 20 sq cm), 97598- Wound care (each additional 20 sq cm)Patient/Family education, Balance training, Stair training, Taping, Dry Needling, Joint mobilization, Joint manipulation, Spinal manipulation, Spinal mobilization, Scar mobilization, and DME instructions.   PLAN FOR NEXT SESSION: continue to progress lower extremity strengthening and higher level balance activity.  Next session progress balance challenges.    9:48 AM, 03/15/24 Greig KATHEE Fuse, PTA/CLT [facility] Ph: [phone]

## 2024-03-17 ENCOUNTER — Ambulatory Visit (HOSPITAL_COMMUNITY)

## 2024-03-17 ENCOUNTER — Encounter (HOSPITAL_COMMUNITY): Payer: Self-pay

## 2024-03-17 DIAGNOSIS — R2689 Other abnormalities of gait and mobility: Secondary | ICD-10-CM

## 2024-03-17 DIAGNOSIS — R262 Difficulty in walking, not elsewhere classified: Secondary | ICD-10-CM | POA: Diagnosis not present

## 2024-03-17 NOTE — Therapy (Signed)
 OUTPATIENT PHYSICAL THERAPY LOWER EXTREMITY TREATMENT   Patient Name: Jonathan Castro MRN: [id number] DOB:Mar 23, 1956, 68 y.o., male Today's Date: 03/17/2024  END OF SESSION:  PT End of Session - 03/17/24 0927     Visit Number 4    Number of Visits 8    Date for Recertification  03/23/24    Authorization Type HTA    Authorization Time Period no auth required    Progress Note Due on Visit 8    PT Start Time 0930    PT Stop Time 1010    PT Time Calculation (min) 40 min    Activity Tolerance Patient tolerated treatment well    Behavior During Therapy WFL for tasks assessed/performed             Past Medical History:  Diagnosis Date   Arthritis    Cancer (HCC)    COPD (chronic obstructive pulmonary disease) (HCC)    History of radiation therapy    Left Lung- 11/25/21-01/06/22- Dr. Lynwood Nasuti   History of radiation therapy    Brain- 02/26/22-03/13/22- Dr. Lynwood Nasuti   Past Surgical History:  Procedure Laterality Date   MEDIASTINOSCOPY N/A 10/20/2021   Procedure: MEDIASTINOSCOPY;  Surgeon: Kerrin Elspeth BROCKS, MD;  Location: [facility] OR;  Service: Thoracic;  Laterality: N/A;   MICROLARYNGOSCOPY W/VOCAL CORD INJECTION Left 04/03/2022   Procedure: MICROLARYNGOSCOPY WITH MEDIALIZATION OF LEFT VOCAL FOLD WITH PROLARYN;  Surgeon: Llewellyn Gerard LABOR, DO;  Location: [facility] OR;  Service: ENT;  Laterality: Left;   ROTATOR CUFF REPAIR Left    VIDEO BRONCHOSCOPY WITH ENDOBRONCHIAL ULTRASOUND N/A 10/20/2021   Procedure: VIDEO BRONCHOSCOPY WITH ENDOBRONCHIAL ULTRASOUND WITH BIOPSIES;  Surgeon: Kerrin Elspeth BROCKS, MD;  Location: [facility] OR;  Service: Thoracic;  Laterality: N/A;   Patient Active Problem List   Diagnosis Date Noted   Hoarseness of voice 04/03/2022   Primary small cell carcinoma of upper lobe of left lung (HCC) 10/30/2021   Encounter for antineoplastic chemotherapy 10/30/2021   Lung nodule 10/27/2021   DDD (degenerative disc disease), cervical 09/03/2021   Gastroesophageal  reflux disease 09/03/2021   Hyperlipidemia 09/03/2021   Primary osteoarthritis 09/03/2021   Rheumatoid arthritis (HCC) 09/03/2021   Periapical abscess of tooth with fistula 09/22/2016   Maxillary sinusitis, chronic 08/11/2016    PCP: Sheryle Carwin, MD  REFERRING PROVIDER: Skotnicki, Meghan A, DO  REFERRING DIAG: R42 (ICD-10-CM) - Dizziness  THERAPY DIAG:  Difficulty in walking, not elsewhere classified  Other abnormalities of gait and mobility  Impairment of balance  Rationale for Evaluation and Treatment: Rehabilitation  ONSET DATE: since 2023 but worse lately over the last 1.5 years  SUBJECTIVE:   SUBJECTIVE STATEMENT: Pt states HEP is going well. Pt reports no falls since last session, states going up and down stairs still an issue.       Evaluation:  No dizziness just weak; lose my balance easily since his cancer treatments a couple years ago; still working; living alone has fallen 3 times in the past 6 months.    PERTINENT HISTORY:  Rotator cuff surgery earlier this year per Dr. Cristy [facility] cancer last treatment 2023; since in remission; sees Dr. Gatha for follow ups  PAIN:  Are you having pain? No  PRECAUTIONS: None   WEIGHT BEARING RESTRICTIONS: No  FALLS:  Has patient fallen in last 6 months? Yes. Number of falls 3  OCCUPATION: landscaping type stuff  PLOF: Independent  PATIENT GOALS: better balance and get stronger  NEXT MD VISIT: 6 months to  referring MD  OBJECTIVE:  Note: Objective measures were completed at Evaluation unless otherwise noted.  DIAGNOSTIC FINDINGS: none  PATIENT SURVEYS:  ABC scale: The Activities-Specific Balance Confidence (ABC) Scale 0% 10 20 30  40 50 60 70 80 90 100% No confidence<->completely confident  "How confident are you that you will not lose your balance or become unsteady when you . . .   Date tested 02/24/2024  Total: #/16 87.5%  1400/1600     COGNITION: Overall cognitive status: Within  functional limits for tasks assessed     SENSATION: WFL  EDEMA:  None noted   POSTURE: rounded shoulders, forward head, and decreased lumbar lordosis  PALPATION: No specific tenderness noted  LOWER EXTREMITY ROM:  Active ROM Right eval Left eval  Hip flexion    Hip extension    Hip abduction    Hip adduction    Hip internal rotation    Hip external rotation    Knee flexion    Knee extension    Ankle dorsiflexion    Ankle plantarflexion    Ankle inversion    Ankle eversion     (Blank rows = not tested)  LOWER EXTREMITY MMT:  MMT Right eval Left eval  Hip flexion 4 4  Hip extension 3+ 3+  Hip abduction    Hip adduction    Hip internal rotation    Hip external rotation    Knee flexion 4+ 4+  Knee extension 4+ 4+  Ankle dorsiflexion 4 4  Ankle plantarflexion    Ankle inversion    Ankle eversion     (Blank rows = not tested)  L FUNCTIONAL TESTS:   SLS right 5 and left 3 5 times sit to stand: 17.28 sec hands on thighs Dynamic Gait Index: Dynamic Gait Index  Mark the lowest level that applies.   Date Performed 02/24/24  Gait level surface (2) Mild Impairment: Walks 20', uses AD, slower speed, mild gait deviations  2. Change in gait speed (2) Mild Impairment: Is able to change speed but demonstrates mild gait deviations, or not gait deviations but unable to achieve a significant change in velocity, or uses an assistive device  3. Gait with horizontal head turns (2) Mild Impairment: Performs head turns smoothly with slight change in gait velocity, i.e., minor disruption to smooth gait path or uses walking aid  4. Gait with vertical head turns (2) Mild Impairment: Performs head turns smoothly with slight change in gait velocity, i.e., minor disruption to smooth gait path or uses walking aid  5. Gait and pivot turn (3) Normal: Pivot turns safely within 3 seconds and stops quickly with no loss of balance  6. Step over obstacle (2) Mild Impairment: Is able to step  over box, but must slow down and adjust steps to clear box safely  7. Step around obstacle (2) Mild Impairment: Is able to step around both cones, but must slow down and adjust steps to clear cones  8. Steps (2) Mild Impairment: Alternating feet, must use rail  Total score 17/24    Score Interpretation: Score of <19 indicates high risk of falls.  Minimally Clinically Important Difference (MCID):  =DGI scores of<21/24 = 1.80 points DGI scores of >21/24 = 0.60 points   [place] T, Inbar-Borovsky N, Brozgol M, Giladi N, [place] JM. The Dynamic Gait Index in healthy older adults: the role of stair climbing, fear of falling and gender. Gait Posture. 2009 Feb;29(2):237-41. doi: 10.1016/j.gaitpost.2008.08.013. Epub 2008 Oct 8. PMID: 81154560; PMCID: EFR7290501.  Pardasaney,  MYRTIS LOIS Bonus, GEANNIE POUR., et al. (2012). Sensitivity to change and responsiveness of four balance measures for community-dwelling older adults. Physical therapy 92(3): 388-397.    GAIT: Distance walked: 60 ft in clinic Assistive device utilized: None Level of assistance: Modified independence Comments: decreased gait speed                                                                                                                                TREATMENT DATE:  03/17/2024  Therapeutic Exercise: -Treadmill 5 minutes, grade 2.0, speed 1.5>1.8>2.2 -Leg Press, plate 4>5, 2 sets of 10 reps, pt cued for increased eccentric control and decreased knee locked out -Hamstring curls, plate 4>5, 2 sets of 10 reps, pt cued for increased knee flexion Neuromuscular Re-education: -Walking marches/buttkicks, 4lb ankle weights, pt cued for increased LE ROM, SBA for safety -Aeromat walks, 4 lengths, in hallway, SBA for safety, pt cued for decreased UE support, lateral and tandem stepping -Step up and overs, 1 sets of 5 reps bilaterally with 4lb ankle weights, pt cued for decreased UE support -Lateral step up and overs, 1 sets of 3 reps  bilaterally with 4lb ankle weights, pt cued for decreased UE support    03/15/24 Standing:  hip abduction 2X10  Hip extension 2X10  Heelraises on incline 20X  Toeraises on decline 20X  Forward lunges onto 4 step 2X10 no UE  4 lateral step downs 20X each LE   Tandem stance 30 x1 each LE lead  SLS 30 trial each with mas Rt: 20, L: 10  Vectors, 3 way leg kick 5X5 1 sets; each with 1 UE assist   03/06/24 Standing:  hip abduction 2X10  Hip extension 2X10  Heelraises on incline 20X  Toeraises on decline 20X  Tandem stance 30 x3 each LE lead  SLS 30 trial each with mas Rt: 12, L: 4  Vectors, 3 way leg kick 5X5 2 sets; each with 1 UE assist Sit to stands no UE 10X    PATIENT EDUCATION:  Education details: Patient educated on exam findings, POC, scope of PT, HEP, and what to expect next visit. Person educated: Patient Education method: Explanation, Demonstration, and Handouts Education comprehension: verbalized understanding, returned demonstration, verbal cues required, and tactile cues required  HOME EXERCISE PROGRAM: Access Code: [email or web address] URL: [email or web address] Date: 02/24/2024 Prepared by: [facility] - Rehab  Exercises - Supine Bridge  - 1 x daily - 7 x weekly - 2 sets - 10 reps - Sit to Stand  - 1 x daily - 7 x weekly - 1 sets - 10 reps - standing single leg balance at the counter (try to not hold on)  - 1 x daily - 7 x weekly - 1 sets - 10 reps - 10 sec hold - Standing Hip Extension with Counter Support  - 1 x daily - 7 x weekly - 2 sets - 10 reps - Standing Hip Abduction with Counter Support  -  1 x daily - 7 x weekly - 2 sets - 10 reps - Heel Raises with Counter Support  - 1 x daily - 7 x weekly - 2 sets - 10 reps   Access Code: [email or web address] URL: [email or web address] Date: 03/06/2024 Prepared by: Greig Fuse - Tandem Stance with Support  - 2 x daily - 7 x weekly - 3 reps - 30 sec hold - Single Leg Stance with Support  - 2 x daily - 7 x  weekly - 3 reps - Standing 3-Way Kick  - 2 x daily - 7 x weekly - 10 reps - 5 sec hold  ASSESSMENT:  CLINICAL IMPRESSION: Patient continues to demonstrate decreased LE strength, decreased gait quality and balance. Patient also demonstrates fair endurance with aerobic based exercise during today's session on treadmill. Patient able to progress dynamic balance and core activation exercises today with resisted walking and step up variations, good performance with verbal cueing. Patient would continue to benefit from skilled physical therapy for increased endurance with ambulation, increased LE strength, and improved balance for improved quality of life, improved independence with balance training and continued progress towards therapy goals.    OBJECTIVE IMPAIRMENTS: Abnormal gait, decreased activity tolerance, decreased balance, difficulty walking, decreased strength, and impaired perceived functional ability.   ACTIVITY LIMITATIONS: bending, standing, squatting, and locomotion level  PARTICIPATION LIMITATIONS: cleaning and yard work  PERSONAL FACTORS: cancer history are also affecting patient's functional outcome.   REHAB POTENTIAL: Good  CLINICAL DECISION MAKING: Evolving/moderate complexity  EVALUATION COMPLEXITY: Moderate   GOALS: Goals reviewed with patient? No  SHORT TERM GOALS: Target date: 03/09/2024 patient will be independent with initial HEP  Baseline: Goal status: INITIAL  2.  Patient will report 50% improvement overall  Baseline:  Goal status: INITIAL  3.  Patient will able to stand SLS on left leg x 10 to demonstrate improved functional balance  Baseline: see above Goal status: INITIAL   LONG TERM GOALS: Target date: 03/23/2024  Patient will be independent in self management strategies to improve quality of life and functional outcomes.  Baseline:  Goal status: INITIAL  2.  Patient will report 70% improvement overall  Baseline:  Goal status:  INITIAL  3.  Patient will able to stand SLS on left leg x 30 to demonstrate improved functional balance  Baseline:  Goal status: INITIAL  4.   Patient will increase  leg MMT's to 5/5 to allow navigation of steps without gait deviation or loss of balance  Baseline: see above Goal status: INITIAL  5.  Patient will increased DGI score to 20/24 to demonstrate improved balance with functional activity. Baseline: 17/24 Goal status: INITIAL   PLAN:  PT FREQUENCY: 2x/week  PT DURATION: 4 weeks  PLANNED INTERVENTIONS: [id number]- PT Re-evaluation, 97110-Therapeutic exercises, 97530- Therapeutic activity, 97112- Neuromuscular re-education, 97535- Self Care, [id number]- Manual therapy, [phone]- Gait training, [phone]- Orthotic Fit/training, [phone]- Canalith repositioning, [id number]- Aquatic Therapy, [phone]- Splinting, [phone]- Wound care (first 20 sq cm), 97598- Wound care (each additional 20 sq cm)Patient/Family education, Balance training, Stair training, Taping, Dry Needling, Joint mobilization, Joint manipulation, Spinal manipulation, Spinal mobilization, Scar mobilization, and DME instructions.   PLAN FOR NEXT SESSION: continue to progress lower extremity strengthening and higher level balance activity.  Next session progress balance challenges.    Ande Therrell, PT, DPT [facility] Office: [phone] 10:14 AM, 03/17/24

## 2024-03-21 ENCOUNTER — Ambulatory Visit (HOSPITAL_COMMUNITY)

## 2024-03-21 ENCOUNTER — Encounter (HOSPITAL_COMMUNITY): Payer: Self-pay

## 2024-03-21 DIAGNOSIS — R2689 Other abnormalities of gait and mobility: Secondary | ICD-10-CM

## 2024-03-21 DIAGNOSIS — R262 Difficulty in walking, not elsewhere classified: Secondary | ICD-10-CM

## 2024-03-21 NOTE — Therapy (Signed)
 OUTPATIENT PHYSICAL THERAPY LOWER EXTREMITY TREATMENT/PN/RE-EVAL   Patient Name: Jonathan Castro MRN: [id number] DOB:04/17/1956, 68 y.o., male Today's Date: 03/21/2024   Progress Note Reporting Period 02/24/24 to 03/21/24  See note below for Objective Data and Assessment of Progress/Goals.      END OF SESSION:  PT End of Session - 03/21/24 0950     Visit Number 5    Number of Visits 8    Date for Recertification  04/11/24    Authorization Type HTA    Authorization Time Period no auth required    Progress Note Due on Visit 8    PT Start Time 0950    PT Stop Time 1028    PT Time Calculation (min) 38 min    Activity Tolerance Patient tolerated treatment well    Behavior During Therapy WFL for tasks assessed/performed           Past Medical History:  Diagnosis Date   Arthritis    Cancer (HCC)    COPD (chronic obstructive pulmonary disease) (HCC)    History of radiation therapy    Left Lung- 11/25/21-01/06/22- Dr. Lynwood Nasuti   History of radiation therapy    Brain- 02/26/22-03/13/22- Dr. Lynwood Nasuti   Past Surgical History:  Procedure Laterality Date   MEDIASTINOSCOPY N/A 10/20/2021   Procedure: MEDIASTINOSCOPY;  Surgeon: Kerrin Elspeth BROCKS, MD;  Location: [facility] OR;  Service: Thoracic;  Laterality: N/A;   MICROLARYNGOSCOPY W/VOCAL CORD INJECTION Left 04/03/2022   Procedure: MICROLARYNGOSCOPY WITH MEDIALIZATION OF LEFT VOCAL FOLD WITH PROLARYN;  Surgeon: Llewellyn Gerard LABOR, DO;  Location: [facility] OR;  Service: ENT;  Laterality: Left;   ROTATOR CUFF REPAIR Left    VIDEO BRONCHOSCOPY WITH ENDOBRONCHIAL ULTRASOUND N/A 10/20/2021   Procedure: VIDEO BRONCHOSCOPY WITH ENDOBRONCHIAL ULTRASOUND WITH BIOPSIES;  Surgeon: Kerrin Elspeth BROCKS, MD;  Location: [facility] OR;  Service: Thoracic;  Laterality: N/A;   Patient Active Problem List   Diagnosis Date Noted   Hoarseness of voice 04/03/2022   Primary small cell carcinoma of upper lobe of left lung (HCC) 10/30/2021   Encounter for  antineoplastic chemotherapy 10/30/2021   Lung nodule 10/27/2021   DDD (degenerative disc disease), cervical 09/03/2021   Gastroesophageal reflux disease 09/03/2021   Hyperlipidemia 09/03/2021   Primary osteoarthritis 09/03/2021   Rheumatoid arthritis (HCC) 09/03/2021   Periapical abscess of tooth with fistula 09/22/2016   Maxillary sinusitis, chronic 08/11/2016    PCP: Sheryle Carwin, MD  REFERRING PROVIDER: Skotnicki, Meghan A, DO  REFERRING DIAG: R42 (ICD-10-CM) - Dizziness  THERAPY DIAG:  Difficulty in walking, not elsewhere classified  Other abnormalities of gait and mobility  Impairment of balance  Rationale for Evaluation and Treatment: Rehabilitation  ONSET DATE: since 2023 but worse lately over the last 1.5 years  SUBJECTIVE:   SUBJECTIVE STATEMENT: Pt reports no fall since last visit. Reports HEP going well. Reports he's learning he needs to [facility] [employer] and do the things he learned last week.  Reports 20-25% improvement with balance. Wants more strength.     Evaluation:  No dizziness just weak; lose my balance easily since his cancer treatments a couple years ago; still working; living alone has fallen 3 times in the past 6 months.    PERTINENT HISTORY:  Rotator cuff surgery earlier this year per Dr. Cristy [facility] Lung cancer last treatment 2023; since in remission; sees Dr. Gatha for follow ups  PAIN:  Are you having pain? No  PRECAUTIONS: None   WEIGHT BEARING RESTRICTIONS: No  FALLS:  Has patient fallen in last 6 months? Yes. Number of falls 3  OCCUPATION: landscaping type stuff  PLOF: Independent  PATIENT GOALS: better balance and get stronger  NEXT MD VISIT: 6 months to referring MD  OBJECTIVE:  Note: Objective measures were completed at Evaluation unless otherwise noted.  DIAGNOSTIC FINDINGS: none  PATIENT SURVEYS:  ABC scale: The Activities-Specific Balance Confidence (ABC) Scale 0% 10 20 30  40 50 60 70 80 90 100% No  confidence<->completely confident  "How confident are you that you will not lose your balance or become unsteady when you . . .   Date tested 02/24/2024  Total: #/16 87.5%  1400/1600     03/21/24: Total ABC score: 1290 / 1600 = 80.6 %   COGNITION: Overall cognitive status: Within functional limits for tasks assessed     SENSATION: WFL  EDEMA:  None noted   POSTURE: rounded shoulders, forward head, and decreased lumbar lordosis  PALPATION: No specific tenderness noted  LOWER EXTREMITY ROM:  Active ROM Right eval Left eval  Hip flexion    Hip extension    Hip abduction    Hip adduction    Hip internal rotation    Hip external rotation    Knee flexion    Knee extension    Ankle dorsiflexion    Ankle plantarflexion    Ankle inversion    Ankle eversion     (Blank rows = not tested)  LOWER EXTREMITY MMT:  MMT Right eval Left eval R 03/21/24 L 03/21/24  Hip flexion 4 4 4  4-  Hip extension 3+ 3+ 4- 4-  Hip abduction      Hip adduction      Hip internal rotation      Hip external rotation      Knee flexion 4+ 4+ 4+ 4+  Knee extension 4+ 4+ 4+ 4+  Ankle dorsiflexion 4 4 5 5   Ankle plantarflexion      Ankle inversion      Ankle eversion       (Blank rows = not tested)   FUNCTIONAL TESTS:   SLS right 5 and left 3 5 times sit to stand: 17.28 sec hands on thighs Dynamic Gait Index: Dynamic Gait Index  Mark the lowest level that applies.   Date Performed 02/24/24  Gait level surface (2) Mild Impairment: Walks 20', uses AD, slower speed, mild gait deviations  2. Change in gait speed (2) Mild Impairment: Is able to change speed but demonstrates mild gait deviations, or not gait deviations but unable to achieve a significant change in velocity, or uses an assistive device  3. Gait with horizontal head turns (2) Mild Impairment: Performs head turns smoothly with slight change in gait velocity, i.e., minor disruption to smooth gait path or uses walking aid  4.  Gait with vertical head turns (2) Mild Impairment: Performs head turns smoothly with slight change in gait velocity, i.e., minor disruption to smooth gait path or uses walking aid  5. Gait and pivot turn (3) Normal: Pivot turns safely within 3 seconds and stops quickly with no loss of balance  6. Step over obstacle (2) Mild Impairment: Is able to step over box, but must slow down and adjust steps to clear box safely  7. Step around obstacle (2) Mild Impairment: Is able to step around both cones, but must slow down and adjust steps to clear cones  8. Steps (2) Mild Impairment: Alternating feet, must use rail  Total score 17/24  Score Interpretation: Score of <19 indicates high risk of falls.  Minimally Clinically Important Difference (MCID):  =DGI scores of<21/24 = 1.80 points DGI scores of >21/24 = 0.60 points   Fountain T, Inbar-Borovsky N, Brozgol M, Giladi N, [place] JM. The Dynamic Gait Index in healthy older adults: the role of stair climbing, fear of falling and gender. Gait Posture. 2009 Feb;29(2):237-41. doi: 10.1016/j.gaitpost.2008.08.013. Epub 2008 Oct 8. PMID: 81154560; PMCID: EFR7290501.  Pardasaney, MYRTIS LOIS Bonus, GEANNIE POUR., et al. (2012). Sensitivity to change and responsiveness of four balance measures for community-dwelling older adults. Physical therapy 92(3): 388-397.    03/21/24: 5 times sit to stand :  14 sec SLS: L-15 R-15 DGI: 1. Gait level surface (2) Mild Impairment: Walks 20', uses assistive devices, slower speed, mild gait deviations. 2. Change in gait speed (2) Mild Impairment: Is able to change speed but demonstrates mild gait deviations, or not gait deviations but unable to achieve a significant change in velocity, or uses an assistive device. 3. Gait with horizontal head turns (1) Moderate Impairment: Performs head turns with moderate change in gait velocity, slows down, staggers but recovers, can continue to walk. 4. Gait with vertical head turns (2)  Mild Impairment: Performs head turns smoothly with slight change in gait velocity, i.e., minor disruption to smooth gait path or uses walking aid. 5. Gait and pivot turn (1) Moderate Impairment: Turns slowly, requires verbal cueing, requires several small steps to catch balance following turn and stop. 6. Step over obstacle (3) Normal: Is able to step over the box without changing gait speed, no evidence of imbalance. 7. Step around obstacles (3) Normal: Is able to walk around cones safely without changing gait speed; no evidence of imbalance. 8. Stairs (2) Mild Impairment: Alternating feet, must use rail.  TOTAL SCORE: 16 / 24      GAIT: Distance walked: 60 ft in clinic Assistive device utilized: None Level of assistance: Modified independence Comments: decreased gait speed                                                                                                                                TREATMENT DATE:  03/21/24: Re-eval/Progress Note: ABC Scale LE MMT SLS 5 times sit to stand DGI Review of goals Discussion of POC, progressing HEP   03/17/2024  Therapeutic Exercise: -Treadmill 5 minutes, grade 2.0, speed 1.5>1.8>2.2 -Leg Press, plate 4>5, 2 sets of 10 reps, pt cued for increased eccentric control and decreased knee locked out -Hamstring curls, plate 4>5, 2 sets of 10 reps, pt cued for increased knee flexion Neuromuscular Re-education: -Walking marches/buttkicks, 4lb ankle weights, pt cued for increased LE ROM, SBA for safety -Aeromat walks, 4 lengths, in hallway, SBA for safety, pt cued for decreased UE support, lateral and tandem stepping -Step up and overs, 1 sets of 5 reps bilaterally with 4lb ankle weights, pt cued for decreased UE support -Lateral step up and overs, 1 sets of 3  reps bilaterally with 4lb ankle weights, pt cued for decreased UE support    03/15/24 Standing:  hip abduction 2X10  Hip extension 2X10  Heelraises on incline 20X  Toeraises  on decline 20X  Forward lunges onto 4 step 2X10 no UE  4 lateral step downs 20X each LE   Tandem stance 30 x1 each LE lead  SLS 30 trial each with mas Rt: 20, L: 10  Vectors, 3 way leg kick 5X5 1 sets; each with 1 UE assist    PATIENT EDUCATION:  Education details: Patient educated on exam findings, POC, scope of PT, HEP, and what to expect next visit. Person educated: Patient Education method: Explanation, Demonstration, and Handouts Education comprehension: verbalized understanding, returned demonstration, verbal cues required, and tactile cues required  HOME EXERCISE PROGRAM: Access Code: [email or web address] URL: [email or web address] Date: 02/24/2024 Prepared by: [facility] - Rehab  Exercises - Supine Bridge  - 1 x daily - 7 x weekly - 2 sets - 10 reps - Sit to Stand  - 1 x daily - 7 x weekly - 1 sets - 10 reps - standing single leg balance at the counter (try to not hold on)  - 1 x daily - 7 x weekly - 1 sets - 10 reps - 10 sec hold - Standing Hip Extension with Counter Support  - 1 x daily - 7 x weekly - 2 sets - 10 reps - Standing Hip Abduction with Counter Support  - 1 x daily - 7 x weekly - 2 sets - 10 reps - Heel Raises with Counter Support  - 1 x daily - 7 x weekly - 2 sets - 10 reps   Access Code: [email or web address] URL: [email or web address] Date: 03/06/2024 Prepared by: Greig Fuse - Tandem Stance with Support  - 2 x daily - 7 x weekly - 3 reps - 30 sec hold - Single Leg Stance with Support  - 2 x daily - 7 x weekly - 3 reps - Standing 3-Way Kick  - 2 x daily - 7 x weekly - 10 reps - 5 sec hold  ASSESSMENT:  CLINICAL IMPRESSION: Progress note/re-evaluation performed this date. Patient demonstrates progress with LE strength via MMT and 5 times sit to stand as well as single leg balance. Patient remains around similar levels with self perceived function and dynamic balance challenge. Patient has met 3 of 8 goals this date. Reports he has seen improvement  with balance but still feels weak in LE. Overall, pt report 20-25% improvement. Patient would continue to benefit from skilled physical therapy for increased endurance with ambulation, increased LE strength, and improved balance for improved quality of life, improved independence with balance training and continued progress towards therapy goals.     OBJECTIVE IMPAIRMENTS: Abnormal gait, decreased activity tolerance, decreased balance, difficulty walking, decreased strength, and impaired perceived functional ability.   ACTIVITY LIMITATIONS: bending, standing, squatting, and locomotion level  PARTICIPATION LIMITATIONS: cleaning and yard work  PERSONAL FACTORS: cancer history are also affecting patient's functional outcome.   REHAB POTENTIAL: Good  CLINICAL DECISION MAKING: Evolving/moderate complexity  EVALUATION COMPLEXITY: Moderate   GOALS: Goals reviewed with patient? No  SHORT TERM GOALS: Target date: 03/27/2024 patient will be independent with initial HEP  Baseline: everyday compliance Goal status: MET  2.  Patient will report 50% improvement overall  Baseline: 20-25% improvement on 03/21/24 Goal status: IN PROGRESS  3.  Patient will able to stand SLS on left leg x 10 to demonstrate improved functional balance  Baseline: 15 on  03/21/24 Goal status: MET   LONG TERM GOALS: Target date: 03/27/2024  Patient will be independent in self management strategies to improve quality of life and functional outcomes.  Baseline:  Goal status: MET  2.  Patient will report 70% improvement overall  Baseline:  Goal status: IN PROGRESS  3.  Patient will able to stand SLS on left leg x 30 to demonstrate improved functional balance  Baseline:  Goal status: IN PROGRESS  4.   Patient will increase  leg MMT's to 5/5 to allow navigation of steps without gait deviation or loss of balance  Baseline: see above Goal status: IN PROGRESS  5.  Patient will increased DGI score to  20/24 to demonstrate improved balance with functional activity. Baseline: 16/24 on 03/21/24 Goal status: IN PROGRESS   PLAN:  PT FREQUENCY: 2x/week  PT DURATION: 4 weeks  PLANNED INTERVENTIONS: [id number]- PT Re-evaluation, 97110-Therapeutic exercises, 97530- Therapeutic activity, 97112- Neuromuscular re-education, 97535- Self Care, [id number]- Manual therapy, [phone]- Gait training, [phone]- Orthotic Fit/training, [phone]- Canalith repositioning, [id number]- Aquatic Therapy, [phone]- Splinting, [phone]- Wound care (first 20 sq cm), 97598- Wound care (each additional 20 sq cm)Patient/Family education, Balance training, Stair training, Taping, Dry Needling, Joint mobilization, Joint manipulation, Spinal manipulation, Spinal mobilization, Scar mobilization, and DME instructions.   PLAN FOR NEXT SESSION: continue to progress lower extremity strengthening and higher level balance activity.  Next session progress balance challenges. Discuss continuing vs discharging from PT, as pt was unsure during re-eval    10:30 AM, 03/21/24 Rosaria Settler, PT, DPT [facility] [place]

## 2024-03-22 DIAGNOSIS — M25512 Pain in left shoulder: Secondary | ICD-10-CM | POA: Diagnosis not present

## 2024-03-22 DIAGNOSIS — M5030 Other cervical disc degeneration, unspecified cervical region: Secondary | ICD-10-CM | POA: Diagnosis not present

## 2024-03-22 DIAGNOSIS — M25511 Pain in right shoulder: Secondary | ICD-10-CM | POA: Diagnosis not present

## 2024-03-22 DIAGNOSIS — M0579 Rheumatoid arthritis with rheumatoid factor of multiple sites without organ or systems involvement: Secondary | ICD-10-CM | POA: Diagnosis not present

## 2024-03-22 DIAGNOSIS — K219 Gastro-esophageal reflux disease without esophagitis: Secondary | ICD-10-CM | POA: Diagnosis not present

## 2024-03-22 DIAGNOSIS — M1991 Primary osteoarthritis, unspecified site: Secondary | ICD-10-CM | POA: Diagnosis not present

## 2024-03-22 DIAGNOSIS — Z6823 Body mass index (BMI) 23.0-23.9, adult: Secondary | ICD-10-CM | POA: Diagnosis not present

## 2024-03-23 ENCOUNTER — Encounter (HOSPITAL_COMMUNITY): Payer: Self-pay

## 2024-03-23 ENCOUNTER — Ambulatory Visit (HOSPITAL_COMMUNITY)

## 2024-03-23 DIAGNOSIS — R262 Difficulty in walking, not elsewhere classified: Secondary | ICD-10-CM

## 2024-03-23 DIAGNOSIS — R2689 Other abnormalities of gait and mobility: Secondary | ICD-10-CM

## 2024-03-23 NOTE — Therapy (Signed)
 OUTPATIENT PHYSICAL THERAPY LOWER EXTREMITY TREATMENT  Patient Name: Jonathan Castro MRN: [id number] DOB:[birth year], 68 y.o., male Today's Date: 03/23/2024     END OF SESSION:  PT End of Session - 03/23/24 0901     Visit Number 6    Number of Visits 8    Date for Recertification  04/11/24    Authorization Type HTA    Authorization Time Period no auth required    Progress Note Due on Visit 8    PT Start Time 0903    PT Stop Time 0943    PT Time Calculation (min) 40 min    Activity Tolerance Patient tolerated treatment well    Behavior During Therapy [facility] for tasks assessed/performed           Past Medical History:  Diagnosis Date   Arthritis    Cancer (HCC)    COPD (chronic obstructive pulmonary disease) (HCC)    History of radiation therapy    Left Lung- 11/25/21-01/06/22- Dr. Lynwood Nasuti   History of radiation therapy    Brain- 02/26/22-03/13/22- Dr. Lynwood Nasuti   Past Surgical History:  Procedure Laterality Date   MEDIASTINOSCOPY N/A 10/20/2021   Procedure: MEDIASTINOSCOPY;  Surgeon: Kerrin Elspeth BROCKS, MD;  Location: [facility] OR;  Service: Thoracic;  Laterality: N/A;   MICROLARYNGOSCOPY W/VOCAL CORD INJECTION Left 04/03/2022   Procedure: MICROLARYNGOSCOPY WITH MEDIALIZATION OF LEFT VOCAL FOLD WITH PROLARYN;  Surgeon: Llewellyn Gerard LABOR, DO;  Location: [facility] OR;  Service: ENT;  Laterality: Left;   ROTATOR CUFF REPAIR Left    VIDEO BRONCHOSCOPY WITH ENDOBRONCHIAL ULTRASOUND N/A 10/20/2021   Procedure: VIDEO BRONCHOSCOPY WITH ENDOBRONCHIAL ULTRASOUND WITH BIOPSIES;  Surgeon: Kerrin Elspeth BROCKS, MD;  Location: [facility] OR;  Service: Thoracic;  Laterality: N/A;   Patient Active Problem List   Diagnosis Date Noted   Hoarseness of voice 04/03/2022   Primary small cell carcinoma of upper lobe of left lung (HCC) 10/30/2021   Encounter for antineoplastic chemotherapy 10/30/2021   Lung nodule 10/27/2021   DDD (degenerative disc disease), cervical 09/03/2021   Gastroesophageal  reflux disease 09/03/2021   Hyperlipidemia 09/03/2021   Primary osteoarthritis 09/03/2021   Rheumatoid arthritis (HCC) 09/03/2021   Periapical abscess of tooth with fistula 09/22/2016   Maxillary sinusitis, chronic 08/11/2016    PCP: Sheryle Carwin, MD  REFERRING PROVIDER: Llewellyn Gerard LABOR, DO  REFERRING DIAG: R42 (ICD-10-CM) - Dizziness  THERAPY DIAG:  Difficulty in walking, not elsewhere classified  Other abnormalities of gait and mobility  Impairment of balance  Rationale for Evaluation and Treatment: Rehabilitation  ONSET DATE: since 2023 but worse lately over the last 1.5 years  SUBJECTIVE:   SUBJECTIVE STATEMENT: Pt stated he wishes to go to [facility] and complete self strengthening and then return to therapy following for reassessment around 04/18/24.  No reports of pain currently and no reports of recent fall.    Evaluation:  No dizziness just weak; lose my balance easily since his cancer treatments a couple years ago; still working; living alone has fallen 3 times in the past 6 months.    PERTINENT HISTORY:  Rotator cuff surgery earlier this year per Dr. Cristy [facility] cancer last treatment 2023; since in remission; sees Dr. Gatha for follow ups  PAIN:  Are you having pain? No  PRECAUTIONS: None   WEIGHT BEARING RESTRICTIONS: No  FALLS:  Has patient fallen in last 6 months? Yes. Number of falls 3  OCCUPATION: landscaping type stuff  PLOF: Independent  PATIENT GOALS: better balance and  get stronger  NEXT MD VISIT: 6 months to referring MD  OBJECTIVE:  Note: Objective measures were completed at Evaluation unless otherwise noted.  DIAGNOSTIC FINDINGS: none  PATIENT SURVEYS:  ABC scale: The Activities-Specific Balance Confidence (ABC) Scale 0% 10 20 30  40 50 60 70 80 90 100% No confidence<->completely confident  "How confident are you that you will not lose your balance or become unsteady when you . . .   Date tested 02/24/2024  Total:  #/16 87.5%  1400/1600     03/21/24: Total ABC score: 1290 / 1600 = 80.6 %   COGNITION: Overall cognitive status: Within functional limits for tasks assessed     SENSATION: WFL  EDEMA:  None noted   POSTURE: rounded shoulders, forward head, and decreased lumbar lordosis  PALPATION: No specific tenderness noted  LOWER EXTREMITY ROM:  Active ROM Right eval Left eval  Hip flexion    Hip extension    Hip abduction    Hip adduction    Hip internal rotation    Hip external rotation    Knee flexion    Knee extension    Ankle dorsiflexion    Ankle plantarflexion    Ankle inversion    Ankle eversion     (Blank rows = not tested)  LOWER EXTREMITY MMT:  MMT Right eval Left eval R 03/21/24 L 03/21/24  Hip flexion 4 4 4  4-  Hip extension 3+ 3+ 4- 4-  Hip abduction      Hip adduction      Hip internal rotation      Hip external rotation      Knee flexion 4+ 4+ 4+ 4+  Knee extension 4+ 4+ 4+ 4+  Ankle dorsiflexion 4 4 5 5   Ankle plantarflexion      Ankle inversion      Ankle eversion       (Blank rows = not tested)   FUNCTIONAL TESTS:   SLS right 5 and left 3 5 times sit to stand: 17.28 sec hands on thighs Dynamic Gait Index: Dynamic Gait Index  Mark the lowest level that applies.   Date Performed 02/24/24  Gait level surface (2) Mild Impairment: Walks 20', uses AD, slower speed, mild gait deviations  2. Change in gait speed (2) Mild Impairment: Is able to change speed but demonstrates mild gait deviations, or not gait deviations but unable to achieve a significant change in velocity, or uses an assistive device  3. Gait with horizontal head turns (2) Mild Impairment: Performs head turns smoothly with slight change in gait velocity, i.e., minor disruption to smooth gait path or uses walking aid  4. Gait with vertical head turns (2) Mild Impairment: Performs head turns smoothly with slight change in gait velocity, i.e., minor disruption to smooth gait path or  uses walking aid  5. Gait and pivot turn (3) Normal: Pivot turns safely within 3 seconds and stops quickly with no loss of balance  6. Step over obstacle (2) Mild Impairment: Is able to step over box, but must slow down and adjust steps to clear box safely  7. Step around obstacle (2) Mild Impairment: Is able to step around both cones, but must slow down and adjust steps to clear cones  8. Steps (2) Mild Impairment: Alternating feet, must use rail  Total score 17/24    Score Interpretation: Score of <19 indicates high risk of falls.  Minimally Clinically Important Difference (MCID):  =DGI scores of<21/24 = 1.80 points DGI scores of >21/24 =  0.60 points   [employer], [place], Brozgol M, Giladi N, [place] JM. The Dynamic Gait Index in healthy older adults: the role of stair climbing, fear of falling and gender. Gait Posture. 2009 Feb;29(2):237-41. doi: 10.1016/j.gaitpost.2008.08.013. Epub 2008 Oct 8. PMID: 81154560; PMCID: EFR7290501.  Pardasaney, MYRTIS LOIS Bonus, GEANNIE POUR., et al. (2012). Sensitivity to change and responsiveness of four balance measures for community-dwelling older adults. Physical therapy 92(3): 388-397.    03/21/24: 5 times sit to stand :  14 sec SLS: L-15 R-15 DGI: 1. Gait level surface (2) Mild Impairment: Walks 20', uses assistive devices, slower speed, mild gait deviations. 2. Change in gait speed (2) Mild Impairment: Is able to change speed but demonstrates mild gait deviations, or not gait deviations but unable to achieve a significant change in velocity, or uses an assistive device. 3. Gait with horizontal head turns (1) Moderate Impairment: Performs head turns with moderate change in gait velocity, slows down, staggers but recovers, can continue to walk. 4. Gait with vertical head turns (2) Mild Impairment: Performs head turns smoothly with slight change in gait velocity, i.e., minor disruption to smooth gait path or uses walking aid. 5. Gait and pivot  turn (1) Moderate Impairment: Turns slowly, requires verbal cueing, requires several small steps to catch balance following turn and stop. 6. Step over obstacle (3) Normal: Is able to step over the box without changing gait speed, no evidence of imbalance. 7. Step around obstacles (3) Normal: Is able to walk around cones safely without changing gait speed; no evidence of imbalance. 8. Stairs (2) Mild Impairment: Alternating feet, must use rail.  TOTAL SCORE: 16 / 24      GAIT: Distance walked: 60 ft in clinic Assistive device utilized: None Level of assistance: Modified independence Comments: decreased gait speed                                                                                                                                TREATMENT DATE:  03/23/24:   - Treadmill 5 minutes, grade 3.0, speed 1.5>1.8>2.2 - Bodycraft walkout 3Pl retro and sidestep 5Rt each - Leg press 4Pl 2x 10 then 5Pl 2x 10 - Squat front of chair 10x good mechanics noted Educated benefits of walking program to address activity tolerance  . 03/21/24: Re-eval/Progress Note: ABC Scale LE MMT SLS 5 times sit to stand DGI Review of goals Discussion of POC, progressing HEP   03/17/2024  Therapeutic Exercise: -Treadmill 5 minutes, grade 2.0, speed 1.5>1.8>2.2 -Leg Press, plate 4>5, 2 sets of 10 reps, pt cued for increased eccentric control and decreased knee locked out -Hamstring curls, plate 4>5, 2 sets of 10 reps, pt cued for increased knee flexion Neuromuscular Re-education: -Walking marches/buttkicks, 4lb ankle weights, pt cued for increased LE ROM, SBA for safety -Aeromat walks, 4 lengths, in hallway, SBA for safety, pt cued for decreased UE support, lateral and tandem stepping -Step up and overs, 1 sets of  5 reps bilaterally with 4lb ankle weights, pt cued for decreased UE support -Lateral step up and overs, 1 sets of 3 reps bilaterally with 4lb ankle weights, pt cued for decreased UE  support      PATIENT EDUCATION:  Education details: Patient educated on exam findings, POC, scope of PT, HEP, and what to expect next visit. Person educated: Patient Education method: Explanation, Demonstration, and Handouts Education comprehension: verbalized understanding, returned demonstration, verbal cues required, and tactile cues required  HOME EXERCISE PROGRAM: Access Code: [email or web address] URL: [email or web address] Date: 02/24/2024 Prepared by: AP - Rehab  Exercises - Supine Bridge  - 1 x daily - 7 x weekly - 2 sets - 10 reps - Sit to Stand  - 1 x daily - 7 x weekly - 1 sets - 10 reps - standing single leg balance at the counter (try to not hold on)  - 1 x daily - 7 x weekly - 1 sets - 10 reps - 10 sec hold - Standing Hip Extension with Counter Support  - 1 x daily - 7 x weekly - 2 sets - 10 reps - Standing Hip Abduction with Counter Support  - 1 x daily - 7 x weekly - 2 sets - 10 reps - Heel Raises with Counter Support  - 1 x daily - 7 x weekly - 2 sets - 10 reps   Access Code: [email or web address] URL: [email or web address] Date: 03/06/2024 Prepared by: Greig Fuse - Tandem Stance with Support  - 2 x daily - 7 x weekly - 3 reps - 30 sec hold - Single Leg Stance with Support  - 2 x daily - 7 x weekly - 3 reps - Standing 3-Way Kick  - 2 x daily - 7 x weekly - 10 reps - 5 sec hold  03/23/24:  Squat Walking program  ASSESSMENT:  CLINICAL IMPRESSION: Discussed continuing PT vs DC.  Pt interested in joining the [facility], plans to go 4 days a week and wishes to return to therapy end of October to reassess balance if needed.  Session focus with LE strengthening and balance training.  Added bodycraft walkout activities for stability with SBA for safety.  Discussed appropriate weight with machines at the gym and encouraged moderate weight with slow controlled movements.  Added squats for functional strengthening and discussed benefits of walking program to address  activity tolerance.  Plan to hold ~4 weeks than reassess balance.    OBJECTIVE IMPAIRMENTS: Abnormal gait, decreased activity tolerance, decreased balance, difficulty walking, decreased strength, and impaired perceived functional ability.   ACTIVITY LIMITATIONS: bending, standing, squatting, and locomotion level  PARTICIPATION LIMITATIONS: cleaning and yard work  PERSONAL FACTORS: cancer history are also affecting patient's functional outcome.   REHAB POTENTIAL: Good  CLINICAL DECISION MAKING: Evolving/moderate complexity  EVALUATION COMPLEXITY: Moderate   GOALS: Goals reviewed with patient? No  SHORT TERM GOALS: Target date: 03/27/2024 patient will be independent with initial HEP  Baseline: everyday compliance Goal status: MET  2.  Patient will report 50% improvement overall  Baseline: 20-25% improvement on 03/21/24 Goal status: IN PROGRESS  3.  Patient will able to stand SLS on left leg x 10 to demonstrate improved functional balance  Baseline: 15 on 03/21/24 Goal status: MET   LONG TERM GOALS: Target date: 03/27/2024  Patient will be independent in self management strategies to improve quality of life and functional outcomes.  Baseline:  Goal status: MET  2.  Patient will report 70% improvement overall  Baseline:  Goal status: IN PROGRESS  3.  Patient will able to stand SLS on left leg x 30 to demonstrate improved functional balance  Baseline:  Goal status: IN PROGRESS  4.   Patient will increase  leg MMT's to 5/5 to allow navigation of steps without gait deviation or loss of balance  Baseline: see above Goal status: IN PROGRESS  5.  Patient will increased DGI score to 20/24 to demonstrate improved balance with functional activity. Baseline: 16/24 on 03/21/24 Goal status: IN PROGRESS   PLAN:  PT FREQUENCY: 2x/week  PT DURATION: 4 weeks  PLANNED INTERVENTIONS: 97164- PT Re-evaluation, 97110-Therapeutic exercises, 97530- Therapeutic activity,  97112- Neuromuscular re-education, 97535- Self Care, 02859- Manual therapy, [phone]- Gait training, [phone]- Orthotic Fit/training, [phone]- Canalith repositioning, [id number]- Aquatic Therapy, [phone]- Splinting, [phone]- Wound care (first 20 sq cm), 97598- Wound care (each additional 20 sq cm)Patient/Family education, Balance training, Stair training, Taping, Dry Needling, Joint mobilization, Joint manipulation, Spinal manipulation, Spinal mobilization, Scar mobilization, and DME instructions.   PLAN FOR NEXT SESSION: Pt plans on joining gym. Hold on therapy for ~4 more weeks then pt would like to return for reassessment.     Augustin Mclean, LPTA/CLT; CBIS [phone]  11:41 AM, 03/23/24

## 2024-03-27 ENCOUNTER — Encounter (HOSPITAL_COMMUNITY): Admitting: Physical Therapy

## 2024-03-29 DIAGNOSIS — D485 Neoplasm of uncertain behavior of skin: Secondary | ICD-10-CM | POA: Diagnosis not present

## 2024-03-29 DIAGNOSIS — Z85828 Personal history of other malignant neoplasm of skin: Secondary | ICD-10-CM | POA: Diagnosis not present

## 2024-03-29 DIAGNOSIS — D2361 Other benign neoplasm of skin of right upper limb, including shoulder: Secondary | ICD-10-CM | POA: Diagnosis not present

## 2024-03-29 DIAGNOSIS — L570 Actinic keratosis: Secondary | ICD-10-CM | POA: Diagnosis not present

## 2024-03-29 DIAGNOSIS — L821 Other seborrheic keratosis: Secondary | ICD-10-CM | POA: Diagnosis not present

## 2024-03-29 DIAGNOSIS — L218 Other seborrheic dermatitis: Secondary | ICD-10-CM | POA: Diagnosis not present

## 2024-04-07 DIAGNOSIS — M9903 Segmental and somatic dysfunction of lumbar region: Secondary | ICD-10-CM | POA: Diagnosis not present

## 2024-04-07 DIAGNOSIS — M62830 Muscle spasm of back: Secondary | ICD-10-CM | POA: Diagnosis not present

## 2024-04-07 DIAGNOSIS — M9905 Segmental and somatic dysfunction of pelvic region: Secondary | ICD-10-CM | POA: Diagnosis not present

## 2024-04-07 DIAGNOSIS — M9902 Segmental and somatic dysfunction of thoracic region: Secondary | ICD-10-CM | POA: Diagnosis not present

## 2024-04-18 DIAGNOSIS — R5383 Other fatigue: Secondary | ICD-10-CM | POA: Diagnosis not present

## 2024-04-19 ENCOUNTER — Encounter (HOSPITAL_COMMUNITY): Payer: Self-pay

## 2024-04-19 ENCOUNTER — Ambulatory Visit (HOSPITAL_COMMUNITY): Attending: Otolaryngology

## 2024-04-19 DIAGNOSIS — R262 Difficulty in walking, not elsewhere classified: Secondary | ICD-10-CM | POA: Diagnosis not present

## 2024-04-19 DIAGNOSIS — R2689 Other abnormalities of gait and mobility: Secondary | ICD-10-CM | POA: Diagnosis not present

## 2024-04-19 NOTE — Therapy (Signed)
 OUTPATIENT PHYSICAL THERAPY LOWER EXTREMITY TREATMENT  Patient Name: Jonathan Castro MRN: [id number] DOB:[birth year], 68 y.o., male Today's Date: 04/19/2024   PHYSICAL THERAPY DISCHARGE SUMMARY  Visits from Start of Care: 6  Current functional level related to goals / functional outcomes: See below   Remaining deficits: Strength and balance   Education / Equipment: See below   Patient agrees to discharge. Patient goals were partially met. Patient is being discharged due to symptoms no longer applicable to referring diagnosis.   END OF SESSION:  PT End of Session - 04/19/24 0903     Visit Number 7    Number of Visits 8    Date for Recertification  04/19/24    Authorization Type HTA    Authorization Time Period no auth required    PT Start Time 0903    PT Stop Time 0926    PT Time Calculation (min) 23 min    Activity Tolerance Patient tolerated treatment well    Behavior During Therapy [facility] for tasks assessed/performed            Past Medical History:  Diagnosis Date   Arthritis    Cancer (HCC)    COPD (chronic obstructive pulmonary disease) (HCC)    History of radiation therapy    Left Lung- 11/25/21-01/06/22- Dr. Lynwood Nasuti   History of radiation therapy    Brain- 02/26/22-03/13/22- Dr. Lynwood Nasuti   Past Surgical History:  Procedure Laterality Date   MEDIASTINOSCOPY N/A 10/20/2021   Procedure: MEDIASTINOSCOPY;  Surgeon: Kerrin Elspeth BROCKS, MD;  Location: [facility] OR;  Service: Thoracic;  Laterality: N/A;   MICROLARYNGOSCOPY W/VOCAL CORD INJECTION Left 04/03/2022   Procedure: MICROLARYNGOSCOPY WITH MEDIALIZATION OF LEFT VOCAL FOLD WITH PROLARYN;  Surgeon: Llewellyn Gerard LABOR, DO;  Location: [facility] OR;  Service: ENT;  Laterality: Left;   ROTATOR CUFF REPAIR Left    VIDEO BRONCHOSCOPY WITH ENDOBRONCHIAL ULTRASOUND N/A 10/20/2021   Procedure: VIDEO BRONCHOSCOPY WITH ENDOBRONCHIAL ULTRASOUND WITH BIOPSIES;  Surgeon: Kerrin Elspeth BROCKS, MD;  Location: [facility] OR;  Service:  Thoracic;  Laterality: N/A;   Patient Active Problem List   Diagnosis Date Noted   Hoarseness of voice 04/03/2022   Primary small cell carcinoma of upper lobe of left lung (HCC) 10/30/2021   Encounter for antineoplastic chemotherapy 10/30/2021   Lung nodule 10/27/2021   DDD (degenerative disc disease), cervical 09/03/2021   Gastroesophageal reflux disease 09/03/2021   Hyperlipidemia 09/03/2021   Primary osteoarthritis 09/03/2021   Rheumatoid arthritis (HCC) 09/03/2021   Periapical abscess of tooth with fistula 09/22/2016   Maxillary sinusitis, chronic 08/11/2016    PCP: Sheryle Carwin, MD  REFERRING PROVIDER: Llewellyn Gerard LABOR, DO  REFERRING DIAG: R42 (ICD-10-CM) - Dizziness  THERAPY DIAG:  Difficulty in walking, not elsewhere classified  Other abnormalities of gait and mobility  Impairment of balance  Rationale for Evaluation and Treatment: Rehabilitation  ONSET DATE: since 2023 but worse lately over the last 1.5 years  SUBJECTIVE:   SUBJECTIVE STATEMENT: Pt states he fell last Thursday and has not been back to the [facility] since. Pt states he was blowing walnuts up and his legs just got weak and started shaking and knocking together, tried to get to the ground safely 2 times but then fell backward down a hill. His help helped him get back to [place] and helped him get to the house. Pt states he is going out of town for two weeks and is waiting to hear back from blood work on special visit to primary care.  Evaluation:  No dizziness just weak; lose my balance easily since his cancer treatments a couple years ago; still working; living alone has fallen 3 times in the past 6 months.    PERTINENT HISTORY:  Rotator cuff surgery earlier this year per Dr. Cristy [facility] Hoarse Lung cancer last treatment 2023; since in remission; sees Dr. Gatha for follow ups  PAIN:  Are you having pain? No  PRECAUTIONS: None   WEIGHT BEARING RESTRICTIONS: No  FALLS:  Has patient fallen  in last 6 months? Yes. Number of falls 3  OCCUPATION: landscaping type stuff  PLOF: Independent  PATIENT GOALS: better balance and get stronger  NEXT MD VISIT: 6 months to referring MD  OBJECTIVE:  Note: Objective measures were completed at Evaluation unless otherwise noted.  DIAGNOSTIC FINDINGS: none  PATIENT SURVEYS:  ABC scale: The Activities-Specific Balance Confidence (ABC) Scale 0% 10 20 30  40 50 60 70 80 90 100% No confidence<->completely confident  "How confident are you that you will not lose your balance or become unsteady when you . . .   Date tested 02/24/2024  Total: #/16 87.5%  1400/1600     03/21/24: Total ABC score: 1290 / 1600 = 80.6 %   COGNITION: Overall cognitive status: Within functional limits for tasks assessed     SENSATION: WFL  EDEMA:  None noted   POSTURE: rounded shoulders, forward head, and decreased lumbar lordosis  PALPATION: No specific tenderness noted  LOWER EXTREMITY ROM:  Active ROM Right eval Left eval  Hip flexion    Hip extension    Hip abduction    Hip adduction    Hip internal rotation    Hip external rotation    Knee flexion    Knee extension    Ankle dorsiflexion    Ankle plantarflexion    Ankle inversion    Ankle eversion     (Blank rows = not tested)  LOWER EXTREMITY MMT:  MMT Right eval Left eval R 03/21/24 L 03/21/24 R 04/19/24 L 04/19/24  Hip flexion 4 4 4  4- 4 4-  Hip extension 3+ 3+ 4- 4- 4- 4-  Hip abduction        Hip adduction        Hip internal rotation        Hip external rotation        Knee flexion 4+ 4+ 4+ 4+ 4+ 4+  Knee extension 4+ 4+ 4+ 4+ 4+ 4+  Ankle dorsiflexion 4 4 5 5 5 5   Ankle plantarflexion        Ankle inversion        Ankle eversion         (Blank rows = not tested)   FUNCTIONAL TESTS:   SLS right 5 and left 3 5 times sit to stand: 17.28 sec hands on thighs Dynamic Gait Index: Dynamic Gait Index  Mark the lowest level that applies.   Date Performed 02/24/24   Gait level surface (2) Mild Impairment: Walks 20', uses AD, slower speed, mild gait deviations  2. Change in gait speed (2) Mild Impairment: Is able to change speed but demonstrates mild gait deviations, or not gait deviations but unable to achieve a significant change in velocity, or uses an assistive device  3. Gait with horizontal head turns (2) Mild Impairment: Performs head turns smoothly with slight change in gait velocity, i.e., minor disruption to smooth gait path or uses walking aid  4. Gait with vertical head turns (2) Mild Impairment: Performs head  turns smoothly with slight change in gait velocity, i.e., minor disruption to smooth gait path or uses walking aid  5. Gait and pivot turn (3) Normal: Pivot turns safely within 3 seconds and stops quickly with no loss of balance  6. Step over obstacle (2) Mild Impairment: Is able to step over box, but must slow down and adjust steps to clear box safely  7. Step around obstacle (2) Mild Impairment: Is able to step around both cones, but must slow down and adjust steps to clear cones  8. Steps (2) Mild Impairment: Alternating feet, must use rail  Total score 17/24    Score Interpretation: Score of <19 indicates high risk of falls.  Minimally Clinically Important Difference (MCID):  =DGI scores of<21/24 = 1.80 points DGI scores of >21/24 = 0.60 points   Laguna T, Inbar-Borovsky N, Brozgol M, Giladi N, [place] JM. The Dynamic Gait Index in healthy older adults: the role of stair climbing, fear of falling and gender. Gait Posture. 2009 Feb;29(2):237-41. doi: 10.1016/j.gaitpost.2008.08.013. Epub 2008 Oct 8. PMID: 81154560; PMCID: EFR7290501.  Pardasaney, MYRTIS LOIS Bonus, GEANNIE POUR., et al. (2012). Sensitivity to change and responsiveness of four balance measures for community-dwelling older adults. Physical therapy 92(3): 388-397.    03/21/24: 5 times sit to stand :  14 sec SLS: L-15 R-15 DGI: 1. Gait level surface (2) Mild Impairment:  Walks 20', uses assistive devices, slower speed, mild gait deviations. 2. Change in gait speed (2) Mild Impairment: Is able to change speed but demonstrates mild gait deviations, or not gait deviations but unable to achieve a significant change in velocity, or uses an assistive device. 3. Gait with horizontal head turns (1) Moderate Impairment: Performs head turns with moderate change in gait velocity, slows down, staggers but recovers, can continue to walk. 4. Gait with vertical head turns (2) Mild Impairment: Performs head turns smoothly with slight change in gait velocity, i.e., minor disruption to smooth gait path or uses walking aid. 5. Gait and pivot turn (1) Moderate Impairment: Turns slowly, requires verbal cueing, requires several small steps to catch balance following turn and stop. 6. Step over obstacle (3) Normal: Is able to step over the box without changing gait speed, no evidence of imbalance. 7. Step around obstacles (3) Normal: Is able to walk around cones safely without changing gait speed; no evidence of imbalance. 8. Stairs (2) Mild Impairment: Alternating feet, must use rail.  TOTAL SCORE: 16 / 24   SLS 04/19/24: R: 8 seconds  L: 8 seconds    GAIT: Distance walked: 60 ft in clinic Assistive device utilized: None Level of assistance: Modified independence Comments: decreased gait speed                                                                                                                                TREATMENT DATE:  04/19/2024  Reassessment/Discharge Note: Goals tracked, balance and strength assessed, HEP reviewed and pt educated on  the importance of well balanced diet and increased water intake. Pt also educated on referral process following his vacation for the next two weeks.  Neuromuscular Re-education: -Standing balance, SLS, 1 trial per side, pt cued for sequencing and max hold time  03/23/24:   - Treadmill 5 minutes, grade 3.0, speed  1.5>1.8>2.2 - [employer] walkout 3Pl retro and sidestep 5Rt each - Leg press 4Pl 2x 10 then 5Pl 2x 10 - Squat front of chair 10x good mechanics noted Educated benefits of walking program to address activity tolerance  . 03/21/24: Re-eval/Progress Note: ABC Scale LE MMT SLS 5 times sit to stand DGI Review of goals Discussion of POC, progressing HEP    PATIENT EDUCATION:  Education details: Patient educated on exam findings, POC, scope of PT, HEP, and what to expect next visit. Person educated: Patient Education method: Explanation, Demonstration, and Handouts Education comprehension: verbalized understanding, returned demonstration, verbal cues required, and tactile cues required  HOME EXERCISE PROGRAM: Access Code: [email or web address] URL: [email or web address] Date: 02/24/2024 Prepared by: AP - Rehab  Exercises - Supine Bridge  - 1 x daily - 7 x weekly - 2 sets - 10 reps - Sit to Stand  - 1 x daily - 7 x weekly - 1 sets - 10 reps - standing single leg balance at the counter (try to not hold on)  - 1 x daily - 7 x weekly - 1 sets - 10 reps - 10 sec hold - Standing Hip Extension with Counter Support  - 1 x daily - 7 x weekly - 2 sets - 10 reps - Standing Hip Abduction with Counter Support  - 1 x daily - 7 x weekly - 2 sets - 10 reps - Heel Raises with Counter Support  - 1 x daily - 7 x weekly - 2 sets - 10 reps   Access Code: [email or web address] URL: [email or web address] Date: 03/06/2024 Prepared by: Greig Fuse - Tandem Stance with Support  - 2 x daily - 7 x weekly - 3 reps - 30 sec hold - Single Leg Stance with Support  - 2 x daily - 7 x weekly - 3 reps - Standing 3-Way Kick  - 2 x daily - 7 x weekly - 10 reps - 5 sec hold  03/23/24:  Squat Walking program  ASSESSMENT:  CLINICAL IMPRESSION: Patient continues to demonstrate decreased LE strength, decreased gait quality and balance. Patient also demonstrates decreased endurance with pt stating he is not up to  working out today due to fatigue. Pt states he has had no bouts of dizziness for quite some time now but would like to continue therapy for balance and leg strengthening as he fell last week. Pt just had a special visit yesterday with primary care for blood work, supposed to get results today. Pt to be discharged this date from this episode of care (no dizziness reported) and pt educated on referral process following his return from vacation for next two weeks and pending test results from primary care for additional care for LE strength and balance.      OBJECTIVE IMPAIRMENTS: Abnormal gait, decreased activity tolerance, decreased balance, difficulty walking, decreased strength, and impaired perceived functional ability.   ACTIVITY LIMITATIONS: bending, standing, squatting, and locomotion level  PARTICIPATION LIMITATIONS: cleaning and yard work  PERSONAL FACTORS: cancer history are also affecting patient's functional outcome.   REHAB POTENTIAL: Good  CLINICAL DECISION MAKING: Evolving/moderate complexity  EVALUATION COMPLEXITY: Moderate   GOALS: Goals reviewed with patient? No  SHORT TERM GOALS: Target  date: 03/27/2024 patient will be independent with initial HEP  Baseline: everyday compliance Goal status: MET  2.  Patient will report 50% improvement overall  Baseline: 20-25% improvement on 03/21/24 Goal status: IN PROGRESS  3.  Patient will able to stand SLS on left leg x 10 to demonstrate improved functional balance  Baseline: 15 on 03/21/24 Goal status: MET   LONG TERM GOALS: Target date: 03/27/2024  Patient will be independent in self management strategies to improve quality of life and functional outcomes.  Baseline:  Goal status: MET  2.  Patient will report 70% improvement overall  Baseline:  Goal status: IN PROGRESS  3.  Patient will able to stand SLS on left leg x 30 to demonstrate improved functional balance  Baseline:  Goal status: IN PROGRESS  4.    Patient will increase  leg MMT's to 5/5 to allow navigation of steps without gait deviation or loss of balance  Baseline: see above Goal status: IN PROGRESS  5.  Patient will increased DGI score to 20/24 to demonstrate improved balance with functional activity. Baseline: 16/24 on 03/21/24 Goal status: IN PROGRESS   PLAN:  PT FREQUENCY: 2x/week  PT DURATION: 4 weeks  PLANNED INTERVENTIONS: 97164- PT Re-evaluation, 97110-Therapeutic exercises, 97530- Therapeutic activity, 97112- Neuromuscular re-education, 97535- Self Care, [id number]- Manual therapy, [phone]- Gait training, [phone]- Orthotic Fit/training, [phone]- Canalith repositioning, [id number]- Aquatic Therapy, [phone]- Splinting, [phone]- Wound care (first 20 sq cm), 97598- Wound care (each additional 20 sq cm)Patient/Family education, Balance training, Stair training, Taping, Dry Needling, Joint mobilization, Joint manipulation, Spinal manipulation, Spinal mobilization, Scar mobilization, and DME instructions.   PLAN FOR NEXT SESSION: Discharged from this episode of care, pt to obtain new referral for balance and strength training   Lang Ada, PT, DPT [facility] Office: [phone] 9:46 AM, 04/19/24

## 2024-05-09 ENCOUNTER — Inpatient Hospital Stay: Attending: Internal Medicine

## 2024-05-09 ENCOUNTER — Ambulatory Visit (HOSPITAL_COMMUNITY)
Admission: RE | Admit: 2024-05-09 | Discharge: 2024-05-09 | Disposition: A | Discharge status: Home / Self Care | Source: Ambulatory Visit | Attending: Internal Medicine | Admitting: Internal Medicine

## 2024-05-09 DIAGNOSIS — E871 Hypo-osmolality and hyponatremia: Secondary | ICD-10-CM | POA: Diagnosis not present

## 2024-05-09 DIAGNOSIS — C3412 Malignant neoplasm of upper lobe, left bronchus or lung: Secondary | ICD-10-CM | POA: Diagnosis not present

## 2024-05-09 DIAGNOSIS — M479 Spondylosis, unspecified: Secondary | ICD-10-CM | POA: Diagnosis not present

## 2024-05-09 DIAGNOSIS — I2510 Atherosclerotic heart disease of native coronary artery without angina pectoris: Secondary | ICD-10-CM | POA: Insufficient documentation

## 2024-05-09 DIAGNOSIS — Z923 Personal history of irradiation: Secondary | ICD-10-CM | POA: Diagnosis not present

## 2024-05-09 DIAGNOSIS — J90 Pleural effusion, not elsewhere classified: Secondary | ICD-10-CM | POA: Insufficient documentation

## 2024-05-09 DIAGNOSIS — I700 Atherosclerosis of aorta: Secondary | ICD-10-CM | POA: Insufficient documentation

## 2024-05-09 DIAGNOSIS — Z9221 Personal history of antineoplastic chemotherapy: Secondary | ICD-10-CM | POA: Insufficient documentation

## 2024-05-09 DIAGNOSIS — C3490 Malignant neoplasm of unspecified part of unspecified bronchus or lung: Secondary | ICD-10-CM

## 2024-05-09 DIAGNOSIS — J438 Other emphysema: Secondary | ICD-10-CM | POA: Diagnosis not present

## 2024-05-09 DIAGNOSIS — R531 Weakness: Secondary | ICD-10-CM | POA: Diagnosis not present

## 2024-05-09 DIAGNOSIS — J432 Centrilobular emphysema: Secondary | ICD-10-CM | POA: Diagnosis not present

## 2024-05-09 LAB — CMP (CANCER CENTER ONLY)
ALT: 9 U/L (ref 0–44)
AST: 14 U/L — ABNORMAL LOW (ref 15–41)
Albumin: 3.8 g/dL (ref 3.5–5.0)
Alkaline Phosphatase: 75 U/L (ref 38–126)
Anion gap: 5 (ref 5–15)
BUN: 12 mg/dL (ref 8–23)
CO2: 26 mmol/L (ref 22–32)
Calcium: 8.9 mg/dL (ref 8.9–10.3)
Chloride: 98 mmol/L (ref 98–111)
Creatinine: 0.84 mg/dL (ref 0.61–1.24)
GFR, Estimated: >60 mL/min (ref >60)
Glucose, Bld: 107 mg/dL — ABNORMAL HIGH (ref 70–99)
Potassium: 4.2 mmol/L (ref 3.5–5.1)
Sodium: 129 mmol/L — ABNORMAL LOW (ref 135–145)
Total Bilirubin: 0.7 mg/dL (ref 0.0–1.2)
Total Protein: 6.2 g/dL — ABNORMAL LOW (ref 6.5–8.1)

## 2024-05-09 LAB — CBC WITH DIFFERENTIAL (CANCER CENTER ONLY)
Abs Immature Granulocytes: 0.01 K/uL (ref 0.00–0.07)
Basophils Absolute: 0 K/uL (ref 0.0–0.1)
Basophils Relative: 1 %
Eosinophils Absolute: 0.1 K/uL (ref 0.0–0.5)
Eosinophils Relative: 2 %
HCT: 40.6 % (ref 39.0–52.0)
Hemoglobin: 14.5 g/dL (ref 13.0–17.0)
Immature Granulocytes: 0 %
Lymphocytes Relative: 18 %
Lymphs Abs: 1.1 K/uL (ref 0.7–4.0)
MCH: 31.5 pg (ref 26.0–34.0)
MCHC: 35.7 g/dL (ref 30.0–36.0)
MCV: 88.1 fL (ref 80.0–100.0)
Monocytes Absolute: 0.5 K/uL (ref 0.1–1.0)
Monocytes Relative: 9 %
Neutro Abs: 4.5 K/uL (ref 1.7–7.7)
Neutrophils Relative %: 70 %
Platelet Count: 207 K/uL (ref 150–400)
RBC: 4.61 MIL/uL (ref 4.22–5.81)
RDW: 13.2 % (ref 11.5–15.5)
WBC Count: 6.3 K/uL (ref 4.0–10.5)
nRBC: 0 % (ref 0.0–0.2)

## 2024-05-09 MED ORDER — IOHEXOL 300 MG/ML  SOLN
75.0000 mL | Freq: Once | INTRAMUSCULAR | Status: AC | PRN
  Administered 2024-05-09: 75 mL via INTRAVENOUS

## 2024-05-16 ENCOUNTER — Inpatient Hospital Stay: Admitting: Internal Medicine

## 2024-05-16 VITALS — BP 91/60 | HR 76 | Temp 98.3°F | Resp 17 | Ht 66.0 in | Wt 158.0 lb

## 2024-05-16 DIAGNOSIS — C3412 Malignant neoplasm of upper lobe, left bronchus or lung: Secondary | ICD-10-CM | POA: Diagnosis not present

## 2024-05-16 DIAGNOSIS — C3490 Malignant neoplasm of unspecified part of unspecified bronchus or lung: Secondary | ICD-10-CM

## 2024-05-16 NOTE — Progress Notes (Signed)
 [facility] [phone]   [phone]  OFFICE PROGRESS NOTE  Jonathan Carwin, MD [address] [place] [address]  DIAGNOSIS: Limited stage (T1c, N2, M0) small cell lung cancer presented with left upper lobe lung mass in addition to left hilar and mediastinal lymphadenopathy diagnosed in April 2023.  PRIOR THERAPY:  1) Systemic chemotherapy with cisplatin  75 Mg/M2 on day 1 and etoposide  100 Mg/M2 on days 1, 2 and 3 every 3 weeks.  First cycle 11/10/2021.  This is concurrent with radiotherapy.  Status post 4 cycles. 2) prophylactic cranial irradiation under the care of Dr. Shannon completed on 03/13/2022.  CURRENT THERAPY: Observation  INTERVAL HISTORY: Jonathan Castro 68 y.o. male returns to the clinic today for 4 months follow-up visit. Discussed the use of AI scribe software for clinical note transcription with the patient, who gave verbal consent to proceed.  History of Present Illness Jonathan Castro is a 69 year old male with limited stage small cell lung cancer who presents for evaluation with repeat CT scan of the chest for restaging of his disease.  Diagnosed with limited stage small cell lung cancer in April 2023, he has undergone systemic chemotherapy with cisplatin  and etoposide  concurrent with radiation, followed by prophylactic cranial radiation. He has been on observation since September 2023.  Over the past six months, he has experienced increasing weakness in both legs, describing them as 'wobbly' and progressively worsening. He recalls an incident where his legs started shaking while using a backpack blower, leading to a fall. Although falls are infrequent, they have occurred. The weakness is bilateral and not localized to one side. No seizures, weakness in arms, chest pain, or hemoptysis.  He acknowledges low sodium levels and reports alcohol consumption on Monday nights and occasionally on Saturdays.    MEDICAL  HISTORY: Past Medical History:  Diagnosis Date   Arthritis    Cancer (HCC)    COPD (chronic obstructive pulmonary disease) (HCC)    History of radiation therapy    Left Lung- 11/25/21-01/06/22- Dr. Lynwood Shannon   History of radiation therapy    Brain- 02/26/22-03/13/22- Dr. Lynwood Shannon    ALLERGIES:  is allergic to penicillamine, penicillins, and arava [leflunomide].  MEDICATIONS:  Current Outpatient Medications  Medication Sig Dispense Refill   folic acid (FOLVITE) 1 MG tablet Take 1 mg by mouth daily.     methotrexate 50 MG/2ML injection Inject 15 mg into the vein every Wednesday.     No current facility-administered medications for this visit.    SURGICAL HISTORY:  Past Surgical History:  Procedure Laterality Date   MEDIASTINOSCOPY N/A 10/20/2021   Procedure: MEDIASTINOSCOPY;  Surgeon: Kerrin Elspeth BROCKS, MD;  Location: [facility] OR;  Service: Thoracic;  Laterality: N/A;   MICROLARYNGOSCOPY W/VOCAL CORD INJECTION Left 04/03/2022   Procedure: MICROLARYNGOSCOPY WITH MEDIALIZATION OF LEFT VOCAL FOLD WITH PROLARYN;  Surgeon: Llewellyn Gerard LABOR, DO;  Location: [facility] OR;  Service: ENT;  Laterality: Left;   ROTATOR CUFF REPAIR Left    VIDEO BRONCHOSCOPY WITH ENDOBRONCHIAL ULTRASOUND N/A 10/20/2021   Procedure: VIDEO BRONCHOSCOPY WITH ENDOBRONCHIAL ULTRASOUND WITH BIOPSIES;  Surgeon: Kerrin Elspeth BROCKS, MD;  Location: [facility] OR;  Service: Thoracic;  Laterality: N/A;    REVIEW OF SYSTEMS:  Constitutional: positive for fatigue Eyes: negative Ears, nose, mouth, throat, and face: negative Respiratory: negative Cardiovascular: negative Gastrointestinal: negative Genitourinary:negative Integument/breast: negative Hematologic/lymphatic: negative Musculoskeletal:positive for muscle weakness Neurological: negative Behavioral/Psych: negative Endocrine: negative Allergic/Immunologic: negative   PHYSICAL  EXAMINATION: General appearance: alert, cooperative, fatigued, and no distress Head:  Normocephalic, without obvious abnormality, atraumatic Neck: no adenopathy, no JVD, supple, symmetrical, trachea midline, and thyroid  not enlarged, symmetric, no tenderness/mass/nodules Lymph nodes: Cervical, supraclavicular, and axillary nodes normal. Resp: clear to auscultation bilaterally Back: symmetric, no curvature. ROM normal. No CVA tenderness. Cardio: regular rate and rhythm, S1, S2 normal, no murmur, click, rub or gallop GI: soft, non-tender; bowel sounds normal; no masses,  no organomegaly Extremities: extremities normal, atraumatic, no cyanosis or edema Neurologic: Alert and oriented X 3, normal strength and tone. Normal symmetric reflexes. Normal coordination and gait  ECOG PERFORMANCE STATUS: 1 - Symptomatic but completely ambulatory  Blood pressure 91/60, pulse 76, temperature 98.3 F (36.8 C), temperature source Temporal, resp. rate 17, height 5' 6 (1.676 m), weight 158 lb (71.7 kg), SpO2 99%.  LABORATORY DATA: Lab Results  Component Value Date   WBC 6.3 05/09/2024   HGB 14.5 05/09/2024   HCT 40.6 05/09/2024   MCV 88.1 05/09/2024   PLT 207 05/09/2024      Chemistry      Component Value Date/Time   NA 129 (L) 05/09/2024 1413   K 4.2 05/09/2024 1413   CL 98 05/09/2024 1413   CO2 26 05/09/2024 1413   BUN 12 05/09/2024 1413   CREATININE 0.84 05/09/2024 1413      Component Value Date/Time   CALCIUM 8.9 05/09/2024 1413   ALKPHOS 75 05/09/2024 1413   AST 14 (L) 05/09/2024 1413   ALT 9 05/09/2024 1413   BILITOT 0.7 05/09/2024 1413       RADIOGRAPHIC STUDIES: CT Chest W Contrast Result Date: 05/16/2024 CLINICAL DATA:  Small cell lung cancer, staging. * Tracking Code: BO * EXAM: CT CHEST WITH CONTRAST TECHNIQUE: Multidetector CT imaging of the chest was performed during intravenous contrast administration. RADIATION DOSE REDUCTION: This exam was performed according to the departmental dose-optimization program which includes automated exposure control,  adjustment of the mA and/or kV according to patient size and/or use of iterative reconstruction technique. CONTRAST:  75mL OMNIPAQUE  IOHEXOL  300 MG/ML  SOLN COMPARISON:  Chest CT 11/09/2023 and 07/14/2023. FINDINGS: Cardiovascular: No acute vascular findings are demonstrated. There is mild aortic and moderate coronary artery atherosclerosis. The heart size is normal. Small amount of anterior pericardial fluid or thickening is unchanged. Mediastinum/Nodes: There are no enlarged mediastinal, hilar or axillary lymph nodes. The thyroid  gland, trachea and esophagus demonstrate no significant findings. Lungs/Pleura: Trace left pleural effusion. No pneumothorax. Severe centrilobular and paraseptal emphysema again noted, with asymmetric involvement of the right upper lobe. Stable right apical calcified scarring. Previously demonstrated ill-defined right lower lobe opacity at the right costophrenic angle has nearly completely resolved, likely reflecting a focus of inflammation or atelectasis. No confluent airspace disease or suspicious pulmonary nodularity. There is a calcified lingular granuloma. Upper abdomen: No acute findings are demonstrated within the visualized upper abdomen. There are stable left renal cysts for which no specific follow-up imaging is recommended. Musculoskeletal/Chest wall: There is no chest wall mass or suspicious osseous finding. Mild spondylosis. IMPRESSION: 1. No evidence of local recurrence or metastatic disease. 2. Near complete resolution of previously demonstrated ill-defined right lower lobe opacity, likely reflecting a focus of inflammation or atelectasis. 3. Aortic Atherosclerosis (ICD10-I70.0) and Emphysema (ICD10-J43.9). Electronically Signed   By: Elsie Perone M.D.   On: 05/16/2024 09:20    ASSESSMENT AND PLAN: This is a very pleasant 68 years old white male recently diagnosed with limited stage (T1c, N2, M0) small cell lung  cancer presented with left upper lobe lung mass in  addition to left hilar and mediastinal lymphadenopathy diagnosed in April 2023. The patient underwent systemic chemotherapy with cisplatin  75 Mg/M2 on day 1 and oh etoposide  100 Mg/M2 on days 1, 2 and 3 every 3 weeks.  Status post 4 cycles.  This was concurrent with radiotherapy followed by prophylactic cranial irradiation. The patient is currently on observation and he is feeling fine except for the recent balance issues and weakness in the lower extremities. He had repeat CT scan of the chest performed recently.  I personally independently reviewed the scan and discussed the result with the patient today.  His scan showed no concerning findings for disease recurrence or metastasis. Assessment and Plan Assessment & Plan Limited stage small cell lung cancer, post-treatment, under surveillance Limited stage small cell lung cancer diagnosed in April 2023, status post systemic chemotherapy with cisplatin  and etoposide  concurrent with radiation, followed by prophylactic cranial radiation. Currently under surveillance since September 2023. Recent CT scan of the chest shows no evidence of disease progression. - Ordered MRI of the brain to evaluate for potential metastasis given bilateral lower extremity weakness. - If MRI is normal, will schedule follow-up in six months with repeat CT scan of the chest.  Hyponatremia Chronic hyponatremia, potentially exacerbated by alcohol consumption. No seizures reported. - Advised reduction in alcohol consumption to help manage sodium levels.  Bilateral lower extremity weakness and falls Bilateral lower extremity weakness with associated falls, progressively worsening. No unilateral weakness or seizures reported. Differential includes potential brain metastasis given small cell lung cancer history. - Ordered MRI of the brain to rule out metastasis as a cause of weakness. He was advised to call immediately if he has any concerning symptoms in the interval.  The  patient voices understanding of current disease status and treatment options and is in agreement with the current care plan.  All questions were answered. The patient knows to call the clinic with any problems, questions or concerns. We can certainly see the patient much sooner if necessary. The total time spent in the appointment was 30 minutes including review of chart and various tests results, discussions about plan of care and coordination of care plan .   Disclaimer: This note was dictated with voice recognition software. Similar sounding words can inadvertently be transcribed and may not be corrected upon review.

## 2024-05-24 ENCOUNTER — Ambulatory Visit (HOSPITAL_COMMUNITY)
Admission: RE | Admit: 2024-05-24 | Discharge: 2024-05-24 | Disposition: A | Discharge status: Home / Self Care | Source: Ambulatory Visit | Attending: Internal Medicine | Admitting: Internal Medicine

## 2024-05-24 DIAGNOSIS — C3490 Malignant neoplasm of unspecified part of unspecified bronchus or lung: Secondary | ICD-10-CM | POA: Insufficient documentation

## 2024-05-24 MED ORDER — GADOBUTROL 1 MMOL/ML IV SOLN
7.0000 mL | Freq: Once | INTRAVENOUS | Status: AC | PRN
  Administered 2024-05-24: 7 mL via INTRAVENOUS

## 2024-11-06 ENCOUNTER — Inpatient Hospital Stay

## 2024-11-13 ENCOUNTER — Inpatient Hospital Stay: Admitting: Internal Medicine
